# Patient Record
Sex: Female | Born: 1935 | Race: White | Hispanic: No | Marital: Single | Smoking: Never smoker
Health system: Southern US, Community
[De-identification: ages and names within clinical notes are randomized; demographics above are authoritative.]

## PROBLEM LIST (undated history)

## (undated) ENCOUNTER — Emergency Department (HOSPITAL_COMMUNITY): Payer: Medicare Other | Source: Home / Self Care

## (undated) DIAGNOSIS — S8263XA Displaced fracture of lateral malleolus of unspecified fibula, initial encounter for closed fracture: Secondary | ICD-10-CM

## (undated) DIAGNOSIS — N390 Urinary tract infection, site not specified: Secondary | ICD-10-CM

## (undated) DIAGNOSIS — D62 Acute posthemorrhagic anemia: Secondary | ICD-10-CM

## (undated) DIAGNOSIS — F329 Major depressive disorder, single episode, unspecified: Secondary | ICD-10-CM

## (undated) DIAGNOSIS — I739 Peripheral vascular disease, unspecified: Secondary | ICD-10-CM

## (undated) DIAGNOSIS — R5382 Chronic fatigue, unspecified: Secondary | ICD-10-CM

## (undated) DIAGNOSIS — R1314 Dysphagia, pharyngoesophageal phase: Secondary | ICD-10-CM

## (undated) DIAGNOSIS — M545 Low back pain: Secondary | ICD-10-CM

## (undated) DIAGNOSIS — S32009A Unspecified fracture of unspecified lumbar vertebra, initial encounter for closed fracture: Secondary | ICD-10-CM

## (undated) DIAGNOSIS — M810 Age-related osteoporosis without current pathological fracture: Secondary | ICD-10-CM

## (undated) DIAGNOSIS — D485 Neoplasm of uncertain behavior of skin: Secondary | ICD-10-CM

## (undated) DIAGNOSIS — G309 Alzheimer's disease, unspecified: Secondary | ICD-10-CM

## (undated) DIAGNOSIS — F070 Personality change due to known physiological condition: Secondary | ICD-10-CM

## (undated) DIAGNOSIS — G43909 Migraine, unspecified, not intractable, without status migrainosus: Secondary | ICD-10-CM

## (undated) DIAGNOSIS — R269 Unspecified abnormalities of gait and mobility: Secondary | ICD-10-CM

## (undated) DIAGNOSIS — T7840XA Allergy, unspecified, initial encounter: Secondary | ICD-10-CM

## (undated) DIAGNOSIS — E871 Hypo-osmolality and hyponatremia: Secondary | ICD-10-CM

## (undated) DIAGNOSIS — F0280 Dementia in other diseases classified elsewhere without behavioral disturbance: Secondary | ICD-10-CM

## (undated) DIAGNOSIS — B3749 Other urogenital candidiasis: Secondary | ICD-10-CM

## (undated) DIAGNOSIS — F32A Depression, unspecified: Secondary | ICD-10-CM

## (undated) DIAGNOSIS — I509 Heart failure, unspecified: Secondary | ICD-10-CM

## (undated) DIAGNOSIS — H9209 Otalgia, unspecified ear: Secondary | ICD-10-CM

## (undated) DIAGNOSIS — S52599A Other fractures of lower end of unspecified radius, initial encounter for closed fracture: Secondary | ICD-10-CM

## (undated) DIAGNOSIS — K219 Gastro-esophageal reflux disease without esophagitis: Secondary | ICD-10-CM

## (undated) DIAGNOSIS — R350 Frequency of micturition: Secondary | ICD-10-CM

## (undated) DIAGNOSIS — R070 Pain in throat: Secondary | ICD-10-CM

## (undated) DIAGNOSIS — S52609A Unspecified fracture of lower end of unspecified ulna, initial encounter for closed fracture: Secondary | ICD-10-CM

## (undated) DIAGNOSIS — K0381 Cracked tooth: Secondary | ICD-10-CM

## (undated) DIAGNOSIS — N318 Other neuromuscular dysfunction of bladder: Secondary | ICD-10-CM

## (undated) DIAGNOSIS — J209 Acute bronchitis, unspecified: Secondary | ICD-10-CM

## (undated) DIAGNOSIS — L89101 Pressure ulcer of unspecified part of back, stage 1: Secondary | ICD-10-CM

## (undated) DIAGNOSIS — F419 Anxiety disorder, unspecified: Secondary | ICD-10-CM

## (undated) DIAGNOSIS — W19XXXA Unspecified fall, initial encounter: Secondary | ICD-10-CM

## (undated) DIAGNOSIS — E039 Hypothyroidism, unspecified: Secondary | ICD-10-CM

## (undated) DIAGNOSIS — Z4789 Encounter for other orthopedic aftercare: Secondary | ICD-10-CM

## (undated) DIAGNOSIS — K5900 Constipation, unspecified: Secondary | ICD-10-CM

## (undated) DIAGNOSIS — B370 Candidal stomatitis: Secondary | ICD-10-CM

## (undated) DIAGNOSIS — D649 Anemia, unspecified: Secondary | ICD-10-CM

## (undated) DIAGNOSIS — I2510 Atherosclerotic heart disease of native coronary artery without angina pectoris: Secondary | ICD-10-CM

## (undated) HISTORY — DX: Hypo-osmolality and hyponatremia: E87.1

## (undated) HISTORY — DX: Atherosclerotic heart disease of native coronary artery without angina pectoris: I25.10

## (undated) HISTORY — DX: Anemia, unspecified: D64.9

## (undated) HISTORY — DX: Other fractures of lower end of unspecified radius, initial encounter for closed fracture: S52.599A

## (undated) HISTORY — DX: Personality change due to known physiological condition: F07.0

## (undated) HISTORY — DX: Alzheimer's disease, unspecified: G30.9

## (undated) HISTORY — DX: Unspecified fall, initial encounter: W19.XXXA

## (undated) HISTORY — DX: Peripheral vascular disease, unspecified: I73.9

## (undated) HISTORY — DX: Pain in throat: R07.0

## (undated) HISTORY — DX: Dementia in other diseases classified elsewhere without behavioral disturbance: F02.80

## (undated) HISTORY — DX: Major depressive disorder, single episode, unspecified: F32.9

## (undated) HISTORY — DX: Chronic fatigue, unspecified: R53.82

## (undated) HISTORY — DX: Candidal stomatitis: B37.0

## (undated) HISTORY — DX: Acute posthemorrhagic anemia: D62

## (undated) HISTORY — PX: TONSILLECTOMY: SUR1361

## (undated) HISTORY — DX: Other urogenital candidiasis: B37.49

## (undated) HISTORY — DX: Pressure ulcer of unspecified part of back, stage 1: L89.101

## (undated) HISTORY — DX: Gastro-esophageal reflux disease without esophagitis: K21.9

## (undated) HISTORY — DX: Cracked tooth: K03.81

## (undated) HISTORY — DX: Hypothyroidism, unspecified: E03.9

## (undated) HISTORY — DX: Acute bronchitis, unspecified: J20.9

## (undated) HISTORY — DX: Neoplasm of uncertain behavior of skin: D48.5

## (undated) HISTORY — DX: Displaced fracture of lateral malleolus of unspecified fibula, initial encounter for closed fracture: S82.63XA

## (undated) HISTORY — DX: Heart failure, unspecified: I50.9

## (undated) HISTORY — DX: Unspecified fracture of lower end of unspecified ulna, initial encounter for closed fracture: S52.609A

## (undated) HISTORY — DX: Encounter for other orthopedic aftercare: Z47.89

## (undated) HISTORY — DX: Urinary tract infection, site not specified: N39.0

## (undated) HISTORY — DX: Dysphagia, pharyngoesophageal phase: R13.14

## (undated) HISTORY — DX: Age-related osteoporosis without current pathological fracture: M81.0

## (undated) HISTORY — DX: Low back pain: M54.5

## (undated) HISTORY — DX: Other neuromuscular dysfunction of bladder: N31.8

## (undated) HISTORY — DX: Otalgia, unspecified ear: H92.09

## (undated) HISTORY — DX: Unspecified abnormalities of gait and mobility: R26.9

## (undated) HISTORY — DX: Migraine, unspecified, not intractable, without status migrainosus: G43.909

## (undated) HISTORY — DX: Frequency of micturition: R35.0

## (undated) HISTORY — PX: TOTAL ABDOMINAL HYSTERECTOMY: SHX209

## (undated) HISTORY — DX: Constipation, unspecified: K59.00

---

## 1998-08-28 ENCOUNTER — Emergency Department (HOSPITAL_COMMUNITY): Admission: EM | Admit: 1998-08-28 | Discharge: 1998-08-28 | Payer: Self-pay | Admitting: Emergency Medicine

## 2002-09-29 ENCOUNTER — Encounter: Payer: Self-pay | Admitting: Emergency Medicine

## 2002-09-29 ENCOUNTER — Emergency Department (HOSPITAL_COMMUNITY): Admission: EM | Admit: 2002-09-29 | Discharge: 2002-09-29 | Payer: Self-pay | Admitting: Emergency Medicine

## 2005-01-17 ENCOUNTER — Ambulatory Visit: Payer: Self-pay | Admitting: *Deleted

## 2005-01-20 ENCOUNTER — Ambulatory Visit: Payer: Self-pay | Admitting: *Deleted

## 2005-01-27 ENCOUNTER — Ambulatory Visit: Payer: Self-pay | Admitting: *Deleted

## 2005-02-03 ENCOUNTER — Ambulatory Visit: Payer: Self-pay | Admitting: *Deleted

## 2011-07-09 ENCOUNTER — Emergency Department (HOSPITAL_COMMUNITY): Payer: Medicare Other

## 2011-07-09 ENCOUNTER — Encounter: Payer: Self-pay | Admitting: Emergency Medicine

## 2011-07-09 ENCOUNTER — Inpatient Hospital Stay (HOSPITAL_COMMUNITY)
Admission: EM | Admit: 2011-07-09 | Discharge: 2011-07-15 | DRG: 469 | Disposition: A | Discharge status: Skilled Nursing Facility | Payer: Medicare Other | Attending: Orthopedic Surgery | Admitting: Orthopedic Surgery

## 2011-07-09 DIAGNOSIS — Y92009 Unspecified place in unspecified non-institutional (private) residence as the place of occurrence of the external cause: Secondary | ICD-10-CM

## 2011-07-09 DIAGNOSIS — S72009A Fracture of unspecified part of neck of unspecified femur, initial encounter for closed fracture: Secondary | ICD-10-CM | POA: Diagnosis present

## 2011-07-09 DIAGNOSIS — R296 Repeated falls: Secondary | ICD-10-CM | POA: Diagnosis present

## 2011-07-09 DIAGNOSIS — F32A Depression, unspecified: Secondary | ICD-10-CM | POA: Diagnosis present

## 2011-07-09 DIAGNOSIS — F3289 Other specified depressive episodes: Secondary | ICD-10-CM | POA: Diagnosis present

## 2011-07-09 DIAGNOSIS — E860 Dehydration: Secondary | ICD-10-CM

## 2011-07-09 DIAGNOSIS — S52609A Unspecified fracture of lower end of unspecified ulna, initial encounter for closed fracture: Secondary | ICD-10-CM

## 2011-07-09 DIAGNOSIS — F411 Generalized anxiety disorder: Secondary | ICD-10-CM | POA: Diagnosis present

## 2011-07-09 DIAGNOSIS — S52509A Unspecified fracture of the lower end of unspecified radius, initial encounter for closed fracture: Principal | ICD-10-CM

## 2011-07-09 DIAGNOSIS — E871 Hypo-osmolality and hyponatremia: Secondary | ICD-10-CM | POA: Diagnosis present

## 2011-07-09 DIAGNOSIS — F419 Anxiety disorder, unspecified: Secondary | ICD-10-CM | POA: Diagnosis present

## 2011-07-09 DIAGNOSIS — D62 Acute posthemorrhagic anemia: Secondary | ICD-10-CM

## 2011-07-09 DIAGNOSIS — F329 Major depressive disorder, single episode, unspecified: Secondary | ICD-10-CM

## 2011-07-09 HISTORY — DX: Unspecified fracture of unspecified lumbar vertebra, initial encounter for closed fracture: S32.009A

## 2011-07-09 HISTORY — DX: Major depressive disorder, single episode, unspecified: F32.9

## 2011-07-09 HISTORY — DX: Allergy, unspecified, initial encounter: T78.40XA

## 2011-07-09 HISTORY — DX: Anxiety disorder, unspecified: F41.9

## 2011-07-09 HISTORY — DX: Depression, unspecified: F32.A

## 2011-07-09 MED ORDER — SODIUM CHLORIDE 0.9 % IV SOLN
20.0000 mL | INTRAVENOUS | Status: DC
  Administered 2011-07-10: 20 mL via INTRAVENOUS

## 2011-07-09 NOTE — ED Notes (Signed)
UJW:[id number]<YN> Expected date:07/09/11<BR> Expected time: 9:48 PM<BR> Means of arrival:Ambulance<BR> Comments:<BR> EMS 241 GC fall

## 2011-07-10 ENCOUNTER — Encounter (HOSPITAL_COMMUNITY): Payer: Self-pay | Admitting: Orthopedic Surgery

## 2011-07-10 ENCOUNTER — Emergency Department (HOSPITAL_COMMUNITY): Payer: Medicare Other | Admitting: Registered Nurse

## 2011-07-10 ENCOUNTER — Encounter (HOSPITAL_COMMUNITY): Payer: Self-pay | Admitting: Registered Nurse

## 2011-07-10 ENCOUNTER — Emergency Department (HOSPITAL_COMMUNITY): Payer: Medicare Other

## 2011-07-10 ENCOUNTER — Encounter (HOSPITAL_COMMUNITY)
Admission: EM | Disposition: A | Discharge status: Skilled Nursing Facility | Payer: Self-pay | Source: Home / Self Care | Attending: Orthopedic Surgery

## 2011-07-10 ENCOUNTER — Other Ambulatory Visit: Payer: Self-pay

## 2011-07-10 DIAGNOSIS — F329 Major depressive disorder, single episode, unspecified: Secondary | ICD-10-CM | POA: Diagnosis present

## 2011-07-10 DIAGNOSIS — E871 Hypo-osmolality and hyponatremia: Secondary | ICD-10-CM | POA: Diagnosis present

## 2011-07-10 DIAGNOSIS — S72009A Fracture of unspecified part of neck of unspecified femur, initial encounter for closed fracture: Secondary | ICD-10-CM | POA: Diagnosis present

## 2011-07-10 DIAGNOSIS — F419 Anxiety disorder, unspecified: Secondary | ICD-10-CM | POA: Diagnosis present

## 2011-07-10 DIAGNOSIS — S52509A Unspecified fracture of the lower end of unspecified radius, initial encounter for closed fracture: Secondary | ICD-10-CM

## 2011-07-10 DIAGNOSIS — F32A Depression, unspecified: Secondary | ICD-10-CM | POA: Diagnosis present

## 2011-07-10 HISTORY — PX: HIP ARTHROPLASTY: SHX981

## 2011-07-10 HISTORY — PX: ORIF WRIST FRACTURE: SHX2133

## 2011-07-10 LAB — DIFFERENTIAL
Basophils Absolute: 0 10*3/uL (ref 0.0–0.1)
Eosinophils Relative: 0 % (ref 0–5)
Lymphocytes Relative: 7 % — ABNORMAL LOW (ref 12–46)
Monocytes Relative: 4 % (ref 3–12)
Neutrophils Relative %: 89 % — ABNORMAL HIGH (ref 43–77)
WBC Morphology: INCREASED

## 2011-07-10 LAB — CBC
Platelets: 144 10*3/uL — ABNORMAL LOW (ref 150–400)
RBC: 3.97 MIL/uL (ref 3.87–5.11)
RDW: 12.8 % (ref 11.5–15.5)
WBC: 7.4 10*3/uL (ref 4.0–10.5)

## 2011-07-10 LAB — URINALYSIS, ROUTINE W REFLEX MICROSCOPIC
Bilirubin Urine: NEGATIVE
Glucose, UA: NEGATIVE mg/dL
Leukocytes, UA: NEGATIVE
Protein, ur: NEGATIVE mg/dL
pH: 7 (ref 5.0–8.0)

## 2011-07-10 LAB — COMPREHENSIVE METABOLIC PANEL
ALT: 26 U/L (ref 0–35)
AST: 35 U/L (ref 0–37)
Albumin: 3.8 g/dL (ref 3.5–5.2)
CO2: 28 mEq/L (ref 19–32)
Calcium: 9.2 mg/dL (ref 8.4–10.5)
Chloride: 91 mEq/L — ABNORMAL LOW (ref 96–112)
GFR calc non Af Amer: 86 mL/min — ABNORMAL LOW (ref >90)
Sodium: 128 mEq/L — ABNORMAL LOW (ref 135–145)
Total Bilirubin: 0.6 mg/dL (ref 0.3–1.2)

## 2011-07-10 SURGERY — HEMIARTHROPLASTY, HIP, DIRECT ANTERIOR APPROACH, FOR FRACTURE
Anesthesia: General | Site: Wrist | Laterality: Right | Wound class: Clean

## 2011-07-10 MED ORDER — NEOSTIGMINE METHYLSULFATE 1 MG/ML IJ SOLN
INTRAMUSCULAR | Status: DC | PRN
  Administered 2011-07-10: 3 mg via INTRAVENOUS

## 2011-07-10 MED ORDER — MORPHINE SULFATE 2 MG/ML IJ SOLN: 0.5000 mg | INTRAMUSCULAR | Status: DC | PRN

## 2011-07-10 MED ORDER — MORPHINE SULFATE 2 MG/ML IJ SOLN
1.0000 mg | INTRAMUSCULAR | Status: DC | PRN
  Administered 2011-07-10: 1 mg via INTRAVENOUS
  Filled 2011-07-10: qty 1

## 2011-07-10 MED ORDER — ONDANSETRON HCL 4 MG/2ML IJ SOLN
INTRAMUSCULAR | Status: DC | PRN
  Administered 2011-07-10: 4 mg via INTRAVENOUS

## 2011-07-10 MED ORDER — MORPHINE SULFATE 2 MG/ML IJ SOLN
2.0000 mg | Freq: Once | INTRAMUSCULAR | Status: AC
  Administered 2011-07-10: 2 mg via INTRAVENOUS
  Filled 2011-07-10: qty 1

## 2011-07-10 MED ORDER — FENTANYL CITRATE 0.05 MG/ML IJ SOLN
25.0000 ug | INTRAMUSCULAR | Status: DC | PRN
  Administered 2011-07-10 (×2): 50 ug via INTRAVENOUS

## 2011-07-10 MED ORDER — ONDANSETRON HCL 4 MG PO TABS: 4.0000 mg | ORAL_TABLET | Freq: Four times a day (QID) | ORAL | Status: DC | PRN

## 2011-07-10 MED ORDER — GLYCOPYRROLATE 0.2 MG/ML IJ SOLN
INTRAMUSCULAR | Status: DC | PRN
  Administered 2011-07-10: .5 mg via INTRAVENOUS

## 2011-07-10 MED ORDER — ESMOLOL HCL 10 MG/ML IV SOLN
INTRAVENOUS | Status: DC | PRN
  Administered 2011-07-10: 20 mg via INTRAVENOUS

## 2011-07-10 MED ORDER — POLYETHYLENE GLYCOL 3350 17 G PO PACK
17.0000 g | PACK | Freq: Every day | ORAL | Status: DC | PRN
  Filled 2011-07-10: qty 1

## 2011-07-10 MED ORDER — EPHEDRINE SULFATE 50 MG/ML IJ SOLN
INTRAMUSCULAR | Status: DC | PRN
  Administered 2011-07-10 (×6): 5 mg via INTRAVENOUS

## 2011-07-10 MED ORDER — PHENOL 1.4 % MT LIQD
1.0000 | OROMUCOSAL | Status: DC | PRN
  Filled 2011-07-10: qty 177

## 2011-07-10 MED ORDER — METOCLOPRAMIDE HCL 5 MG/ML IJ SOLN: 5.0000 mg | Freq: Three times a day (TID) | INTRAMUSCULAR | Status: DC | PRN

## 2011-07-10 MED ORDER — ROCURONIUM BROMIDE 100 MG/10ML IV SOLN
INTRAVENOUS | Status: DC | PRN
  Administered 2011-07-10: 30 mg via INTRAVENOUS
  Administered 2011-07-10: 10 mg via INTRAVENOUS

## 2011-07-10 MED ORDER — CEFAZOLIN SODIUM 1-5 GM-% IV SOLN
INTRAVENOUS | Status: AC
  Administered 2011-07-10: 1000 mg
  Filled 2011-07-10: qty 50

## 2011-07-10 MED ORDER — HYDROCODONE-ACETAMINOPHEN 5-325 MG PO TABS
1.0000 | ORAL_TABLET | ORAL | Status: DC | PRN
  Administered 2011-07-10 – 2011-07-11 (×2): 2 via ORAL
  Administered 2011-07-11 – 2011-07-13 (×3): 1 via ORAL
  Filled 2011-07-10 (×2): qty 1
  Filled 2011-07-10 (×2): qty 2
  Filled 2011-07-10 (×2): qty 1

## 2011-07-10 MED ORDER — FLEET ENEMA 7-19 GM/118ML RE ENEM: 1.0000 | ENEMA | Freq: Every day | RECTAL | Status: DC | PRN

## 2011-07-10 MED ORDER — ACETAMINOPHEN 325 MG PO TABS
650.0000 mg | ORAL_TABLET | Freq: Four times a day (QID) | ORAL | Status: DC | PRN
  Administered 2011-07-14: 650 mg via ORAL
  Filled 2011-07-10 (×2): qty 1
  Filled 2011-07-10: qty 2

## 2011-07-10 MED ORDER — PROPOFOL 10 MG/ML IV EMUL
INTRAVENOUS | Status: DC | PRN
  Administered 2011-07-10: 100 mg via INTRAVENOUS

## 2011-07-10 MED ORDER — ACETAMINOPHEN 650 MG RE SUPP: 650.0000 mg | Freq: Four times a day (QID) | RECTAL | Status: DC | PRN

## 2011-07-10 MED ORDER — ALUM & MAG HYDROXIDE-SIMETH 200-200-20 MG/5ML PO SUSP: 30.0000 mL | ORAL | Status: DC | PRN

## 2011-07-10 MED ORDER — DEXAMETHASONE SODIUM PHOSPHATE 4 MG/ML IJ SOLN
8.0000 mg | Freq: Once | INTRAMUSCULAR | Status: AC | PRN
  Filled 2011-07-10: qty 2

## 2011-07-10 MED ORDER — CEFAZOLIN SODIUM 1-5 GM-% IV SOLN
1.0000 g | Freq: Four times a day (QID) | INTRAVENOUS | Status: AC
  Administered 2011-07-10 – 2011-07-11 (×3): 1 g via INTRAVENOUS
  Filled 2011-07-10 (×3): qty 50

## 2011-07-10 MED ORDER — LIDOCAINE HCL (CARDIAC) 20 MG/ML IV SOLN
INTRAVENOUS | Status: DC | PRN
  Administered 2011-07-10: 60 mg via INTRAVENOUS

## 2011-07-10 MED ORDER — LACTATED RINGERS IV SOLN
INTRAVENOUS | Status: DC | PRN
  Administered 2011-07-10: 11:00:00 via INTRAVENOUS

## 2011-07-10 MED ORDER — CEFAZOLIN SODIUM 1-5 GM-% IV SOLN: 1.0000 g | INTRAVENOUS | Status: DC

## 2011-07-10 MED ORDER — MENTHOL 3 MG MT LOZG
1.0000 | LOZENGE | OROMUCOSAL | Status: DC | PRN
  Filled 2011-07-10: qty 9

## 2011-07-10 MED ORDER — ENOXAPARIN SODIUM 40 MG/0.4ML ~~LOC~~ SOLN
40.0000 mg | SUBCUTANEOUS | Status: DC
  Administered 2011-07-11 – 2011-07-13 (×3): 40 mg via SUBCUTANEOUS
  Filled 2011-07-10 (×3): qty 0.4

## 2011-07-10 MED ORDER — ENOXAPARIN SODIUM 40 MG/0.4ML ~~LOC~~ SOLN
40.0000 mg | SUBCUTANEOUS | Status: DC
  Filled 2011-07-10 (×2): qty 0.4

## 2011-07-10 MED ORDER — BISACODYL 10 MG RE SUPP
10.0000 mg | Freq: Every day | RECTAL | Status: DC | PRN
  Filled 2011-07-10: qty 1

## 2011-07-10 MED ORDER — POTASSIUM CHLORIDE IN NACL 20-0.9 MEQ/L-% IV SOLN
INTRAVENOUS | Status: DC
  Administered 2011-07-11: 03:00:00 via INTRAVENOUS
  Filled 2011-07-10 (×3): qty 1000

## 2011-07-10 MED ORDER — ONDANSETRON HCL 4 MG/2ML IJ SOLN: 4.0000 mg | Freq: Four times a day (QID) | INTRAMUSCULAR | Status: DC | PRN

## 2011-07-10 MED ORDER — DOCUSATE SODIUM 100 MG PO CAPS
100.0000 mg | ORAL_CAPSULE | Freq: Two times a day (BID) | ORAL | Status: DC
  Administered 2011-07-10 – 2011-07-13 (×4): 100 mg via ORAL
  Filled 2011-07-10 (×9): qty 1

## 2011-07-10 MED ORDER — FENTANYL CITRATE 0.05 MG/ML IJ SOLN
INTRAMUSCULAR | Status: AC
  Filled 2011-07-10: qty 2

## 2011-07-10 MED ORDER — SODIUM CHLORIDE 0.9 % IV SOLN
INTRAVENOUS | Status: DC
  Administered 2011-07-10: 04:00:00 via INTRAVENOUS

## 2011-07-10 MED ORDER — MAGNESIUM HYDROXIDE 400 MG/5ML PO SUSP: 30.0000 mL | Freq: Two times a day (BID) | ORAL | Status: DC | PRN

## 2011-07-10 MED ORDER — SODIUM CHLORIDE 0.9 % IV BOLUS (SEPSIS)
500.0000 mL | Freq: Once | INTRAVENOUS | Status: AC
  Administered 2011-07-10: 500 mL via INTRAVENOUS

## 2011-07-10 MED ORDER — FENTANYL CITRATE 0.05 MG/ML IJ SOLN
INTRAMUSCULAR | Status: DC | PRN
  Administered 2011-07-10 (×5): 50 ug via INTRAVENOUS

## 2011-07-10 MED ORDER — LACTATED RINGERS IV SOLN: INTRAVENOUS | Status: DC

## 2011-07-10 MED ORDER — BISACODYL 5 MG PO TBEC: 10.0000 mg | DELAYED_RELEASE_TABLET | Freq: Every day | ORAL | Status: DC | PRN

## 2011-07-10 MED ORDER — METOCLOPRAMIDE HCL 10 MG PO TABS: 5.0000 mg | ORAL_TABLET | Freq: Three times a day (TID) | ORAL | Status: DC | PRN

## 2011-07-10 MED ORDER — MEPERIDINE HCL 25 MG/ML IJ SOLN: 6.2500 mg | INTRAMUSCULAR | Status: DC | PRN

## 2011-07-10 SURGICAL SUPPLY — 105 items
BAG ZIPLOCK 12X15 (MISCELLANEOUS) ×3 IMPLANT
BANDAGE GAUZE ELAST BULKY 4 IN (GAUZE/BANDAGES/DRESSINGS) ×3 IMPLANT
BIT DRILL 2 FAST STEP (BIT) ×3 IMPLANT
BIT DRILL 2.5X4 QC (BIT) ×3 IMPLANT
BLADE EXTENDED COATED 6.5IN (ELECTRODE) ×3 IMPLANT
BLADE HEX COATED 2.75 (ELECTRODE) ×3 IMPLANT
BLADE SAW SAG 73X25 THK (BLADE) ×1
BLADE SAW SGTL 13.0X1.19X90.0M (BLADE) ×3 IMPLANT
BLADE SAW SGTL 73X25 THK (BLADE) ×2 IMPLANT
BRUSH FEMORAL CANAL (MISCELLANEOUS) ×3 IMPLANT
CANISTER SUCTION 2500CC (MISCELLANEOUS) ×3 IMPLANT
CEMENT BONE DEPUY (Cement) ×6 IMPLANT
CEMENT RESTRICTOR DEPUY SZ 4 (Cement) ×3 IMPLANT
CLOTH BEACON ORANGE TIMEOUT ST (SAFETY) ×3 IMPLANT
CUFF TOURN SGL QUICK 18 (TOURNIQUET CUFF) ×3 IMPLANT
DECANTER SPIKE VIAL GLASS SM (MISCELLANEOUS) ×3 IMPLANT
DRAPE INCISE IOBAN 66X45 STRL (DRAPES) ×3 IMPLANT
DRAPE INCISE IOBAN 85X60 (DRAPES) ×3 IMPLANT
DRAPE LG THREE QUARTER DISP (DRAPES) ×3 IMPLANT
DRAPE OEC MINIVIEW 54X84 (DRAPES) ×3 IMPLANT
DRAPE ORTHO SPLIT 77X108 STRL (DRAPES) ×2
DRAPE POUCH INSTRU U-SHP 10X18 (DRAPES) ×3 IMPLANT
DRAPE SURG ORHT 6 SPLT 77X108 (DRAPES) ×4 IMPLANT
DRAPE U-SHAPE 47X51 STRL (DRAPES) ×3 IMPLANT
DRAPE WARM FLUID 44X44 (DRAPE) ×3 IMPLANT
DRSG EMULSION OIL 3X3 NADH (GAUZE/BANDAGES/DRESSINGS) ×3 IMPLANT
DRSG MEPILEX BORDER 4X12 (GAUZE/BANDAGES/DRESSINGS) ×3 IMPLANT
DRSG PAD ABDOMINAL 8X10 ST (GAUZE/BANDAGES/DRESSINGS) IMPLANT
DURAPREP 26ML APPLICATOR (WOUND CARE) ×3 IMPLANT
ELECT REM PT RETURN 9FT ADLT (ELECTROSURGICAL) ×3
ELECTRODE REM PT RTRN 9FT ADLT (ELECTROSURGICAL) ×2 IMPLANT
EVACUATOR 1/8 PVC DRAIN (DRAIN) IMPLANT
EVACUATOR SILICONE 100CC (DRAIN) IMPLANT
FACESHIELD LNG OPTICON STERILE (SAFETY) IMPLANT
GAUZE KERLIX 2  STERILE LF (GAUZE/BANDAGES/DRESSINGS) ×3 IMPLANT
GAUZE SPONGE 4X4 16PLY XRAY LF (GAUZE/BANDAGES/DRESSINGS) ×9 IMPLANT
GAUZE XEROFORM 1X8 LF (GAUZE/BANDAGES/DRESSINGS) ×3 IMPLANT
GAUZE XEROFORM 5X9 LF (GAUZE/BANDAGES/DRESSINGS) IMPLANT
GLOVE BIO SURGEON STRL SZ8 (GLOVE) ×6 IMPLANT
GLOVE BIOGEL PI IND STRL 8 (GLOVE) ×6 IMPLANT
GLOVE BIOGEL PI INDICATOR 8 (GLOVE) ×3
GLOVE ECLIPSE 8.5 STRL (GLOVE) ×3 IMPLANT
HANDPIECE INTERPULSE COAX TIP (DISPOSABLE) ×1
IMMOBILIZER KNEE 20 (SOFTGOODS) ×3
IMMOBILIZER KNEE 20 THIGH 36 (SOFTGOODS) ×2 IMPLANT
K-WIRE 1.6 (WIRE) ×2
K-WIRE FX5X1.6XNS BN SS (WIRE) ×4
KIT BASIN OR (CUSTOM PROCEDURE TRAY) ×3 IMPLANT
KWIRE 4.0 X .045IN (WIRE) ×6 IMPLANT
KWIRE 4.0 X .062IN (WIRE) ×9 IMPLANT
KWIRE FX5X1.6XNS BN SS (WIRE) ×4 IMPLANT
MANIFOLD NEPTUNE II (INSTRUMENTS) ×6 IMPLANT
NEEDLE HYPO 22GX1.5 SAFETY (NEEDLE) IMPLANT
NS IRRIG 1000ML POUR BTL (IV SOLUTION) ×6 IMPLANT
PACK LOWER EXTREMITY WL (CUSTOM PROCEDURE TRAY) ×3 IMPLANT
PACK TOTAL JOINT (CUSTOM PROCEDURE TRAY) ×3 IMPLANT
PAD CAST 3X4 CTTN HI CHSV (CAST SUPPLIES) ×2 IMPLANT
PAD CAST 4YDX4 CTTN HI CHSV (CAST SUPPLIES) ×2 IMPLANT
PADDING CAST COTTON 3X4 STRL (CAST SUPPLIES) ×1
PADDING CAST COTTON 4X4 STRL (CAST SUPPLIES) ×1
PADDING WEBRIL 3 STERILE (GAUZE/BANDAGES/DRESSINGS) ×3 IMPLANT
PASSER SUT SWANSON 36MM LOOP (INSTRUMENTS) ×3 IMPLANT
PEG SUBCHONDRAL SMOOTH 2.0X18 (Peg) ×3 IMPLANT
PEG SUBCHONDRAL SMOOTH 2.0X20 (Peg) ×3 IMPLANT
PEG SUBCHONDRAL SMOOTH 2.0X22 (Peg) ×3 IMPLANT
PEG THREADED 2.5MMX18MM LONG (Peg) ×3 IMPLANT
PEG THREADED 2.5MMX20MM LONG (Peg) ×3 IMPLANT
PEG THREADED 2.5MMX22MM LONG (Peg) ×3 IMPLANT
PILLOW ABDUCTION HIP (SOFTGOODS) ×3 IMPLANT
PLATE STAN 24.4X59.5 RT (Plate) ×3 IMPLANT
POSITIONER SURGICAL ARM (MISCELLANEOUS) ×6 IMPLANT
SCREW BN 12X3.5XNS CORT TI (Screw) ×4 IMPLANT
SCREW CORT 3.5X10 LNG (Screw) ×3 IMPLANT
SCREW CORT 3.5X12 (Screw) ×2 IMPLANT
SCREW CORT 3.5X14 LNG (Screw) ×3 IMPLANT
SET HNDPC FAN SPRY TIP SCT (DISPOSABLE) ×2 IMPLANT
SPLINT PLASTER 3X15 (CAST SUPPLIES) ×3 IMPLANT
SPONGE GAUZE 4X4 12PLY (GAUZE/BANDAGES/DRESSINGS) ×6 IMPLANT
SPONGE LAP 18X18 X RAY DECT (DISPOSABLE) ×6 IMPLANT
STAPLER VISISTAT 35W (STAPLE) ×3 IMPLANT
SUCTION FRAZIER 12FR DISP (SUCTIONS) ×3 IMPLANT
SUT BONE WAX W31G (SUTURE) IMPLANT
SUT ETHIBOND NAB CT1 #1 30IN (SUTURE) IMPLANT
SUT ETHILON 6 0 PS 3 18 (SUTURE) IMPLANT
SUT FIBERWIRE #2 38 T-5 BLUE (SUTURE) ×6
SUT MERSILENE 4 0 P 3 (SUTURE) IMPLANT
SUT MNCRL AB 4-0 PS2 18 (SUTURE) IMPLANT
SUT PROLENE 3 0 PS 2 (SUTURE) IMPLANT
SUT PROLENE 4 0 P 3 18 (SUTURE) IMPLANT
SUT PROLENE 4 0 RB 1 (SUTURE)
SUT PROLENE 4-0 RB1 .5 CRCL 36 (SUTURE) IMPLANT
SUT VIC AB 0 CT1 27 (SUTURE) ×1
SUT VIC AB 0 CT1 27XBRD ANTBC (SUTURE) ×2 IMPLANT
SUT VIC AB 1 CT1 27 (SUTURE) ×2
SUT VIC AB 1 CT1 27XBRD ANTBC (SUTURE) ×4 IMPLANT
SUT VIC AB 2-0 CT1 27 (SUTURE) ×1
SUT VIC AB 2-0 CT1 TAPERPNT 27 (SUTURE) ×2 IMPLANT
SUT VIC AB 2-0 CT2 27 (SUTURE) ×3 IMPLANT
SUTURE FIBERWR #2 38 T-5 BLUE (SUTURE) ×4 IMPLANT
SYR 30ML LL (SYRINGE) IMPLANT
TOWER CARTRIDGE SMART MIX (DISPOSABLE) ×3 IMPLANT
TRAY FOLEY CATH 14FRSI W/METER (CATHETERS) ×3 IMPLANT
TRAY PREP A LATEX SAFE STRL (SET/KITS/TRAYS/PACK) ×6 IMPLANT
UNDERPAD 30X30 INCONTINENT (UNDERPADS AND DIAPERS) ×3 IMPLANT
WATER STERILE IRR 1500ML POUR (IV SOLUTION) ×3 IMPLANT

## 2011-07-10 NOTE — Anesthesia Postprocedure Evaluation (Signed)
  Anesthesia Post-op Note  Patient: Laura Benitez  Procedure(s) Performed:  ARTHROPLASTY BIPOLAR HIP; OPEN REDUCTION INTERNAL FIXATION (ORIF) WRIST FRACTURE  Patient Location: PACU  Anesthesia Type: General  Level of Consciousness: awake and alert   Airway and Oxygen Therapy: Patient Spontanous Breathing  Post-op Pain: mild  Post-op Assessment: Post-op Vital signs reviewed, Patient's Cardiovascular Status Stable, Respiratory Function Stable, Patent Airway and No signs of Nausea or vomiting  Post-op Vital Signs: stable  Complications: No apparent anesthesia complications

## 2011-07-10 NOTE — Preoperative (Signed)
Beta Blockers   Reason not to administer Beta Blockers:Not Applicable 

## 2011-07-10 NOTE — ED Notes (Signed)
2 Belongings bag placed under nurses desk on blue side. Pt transported to OR.

## 2011-07-10 NOTE — Transfer of Care (Signed)
Immediate Anesthesia Transfer of Care Note  Patient: Laura Benitez  Procedure(s) Performed:  ARTHROPLASTY BIPOLAR HIP; OPEN REDUCTION INTERNAL FIXATION (ORIF) WRIST FRACTURE  Patient Location: PACU  Anesthesia Type: General  Level of Consciousness: awake, alert , oriented and patient cooperative  Airway & Oxygen Therapy: Patient Spontanous Breathing and Patient connected to face mask oxygen  Post-op Assessment: Report given to PACU RN and Post -op Vital signs reviewed and stable  Post vital signs: Reviewed and stable  Complications: No apparent anesthesia complications

## 2011-07-10 NOTE — Op Note (Signed)
NAMEDAGMAR, ADCOX                 ACCOUNT NO.:  [id number]  MEDICAL RECORD NO.:  [id number]  LOCATION:  WOTF                         FACILITY:  [facility]  PHYSICIAN:  Doralee Albino. Carola Frost, M.D. DATE OF BIRTH:  [birth year]  DATE OF PROCEDURE:  07/10/2011 DATE OF DISCHARGE:                              OPERATIVE REPORT   PREOPERATIVE DIAGNOSES: 1. Displaced right femoral neck fracture. 2. Displaced right distal radius fracture.  POSTOPERATIVE DIAGNOSES: 1. Displaced right femoral neck fracture. 2. Displaced right distal radius fracture.  PROCEDURES: 1. Right hip hemiarthroplasty using a DePuy cemented #2 femoral stem,     standard neck, 46 mm head. 2. Open reduction internal fixation of right distal radius with distal     volar radius plate.  SURGEON:  Doralee Albino. Carola Frost, M.D.  ASSISTANT:  Mearl Latin, [place]  ANESTHESIA:  General.  COMPLICATIONS:  None.  ESTIMATED BLOOD LOSS:  Combined, about 200 cc.  URINARY OUTPUT:  950 cc.  DISPOSITION:  To PACU.  CONDITION:  Stable.  BRIEF SUMMARY AND INDICATION FOR PROCEDURE:  Laura Benitez is a very pleasant 77 year old female who sustained a fall at her home resulting in a displaced fracture of her right hip and right distal radius.  I discussed with her preoperatively the risks and benefits of surgical repair including a possibility of failure to unite, need for further surgery, arthritis, DVT, PE, heart attack, stroke, need for conversion to total arthroplasty, possibility of dislocation or instability, leg length inequality, and multiple others.  The patient understood these risks and voiced her consent to proceed.  BRIEF SUMMARY OF PROCEDURE:  The patient was administered 1 g of Ancef preoperatively, taken to the room where general anesthesia was induced. She was carefully positioned right side up.  Right lower extremity was prepped and draped in the usual sterile fashion.  The Foley, which had been placed in the emergency department  was brought approximately to allow for inspection by the anesthesia team over the course of the case. After a standard prep and drape of the right hip, incision was made, dissection carried down to the tensor, which was then split in line with the skin.  The hip was rotated to expose the medius, which was retracted,  piriformis with a joint tendon with the short rotators, this was divided and tagged at its insertion.  This exposed the capsule underneath.  The capsule was T'd along the neck and back to the acetabulum.  The femoral neck cut was then refined using a saw blade and a cutting guide.  The femoral head was then withdrawn and sized to a 46. Broken neck fragments were debrided.  The trial femoral heads were then placed in the acetabulum again finding best fit with the 46.  The proximal femur was controlled by my assistant Montez Morita who was critical necessary for the case.  He exposed the proximal femur during the use of the box cutter, canal finder, lateralizer.  Sequential reaming up to 3, sequential broaching up to 2 where we failed to achieve good proximal rotational control despite what felt like some resistance. Consequently, decision was made to convert to a cemented stem, which would provide for  maximal stability particularly in rotation.  Standard cementing technique was used with lavage, irrigation, packing the canal followed by the placement of the stem, carefully controlling rotation throughout and then the standard neck and head were applied, the hip relocated and just as the trial components had been stable, so the actual components were stable in full flexion, adduction, internal rotation as well as extension and external rotation.  The capsule was then repaired back with figure-of-eight #1 Vicryl, a #2 FiberWire directly backto bone for the tendon and then the tensor with #1 figure- of-eight Vicryl, 0 Vicryl for the deep, subcu 2-0 Vicryl, and staples for the skin.   Sterile gently compressive dressing was applied.  Again, Montez Morita assisted me throughout the procedure and was necessary for its safe and effective completion.  Of note, Anesthesia discerned that there was not any flow from the Foley catheter, but was unable to adjust the catheter during the procedure because of increased risk of disruption of the sterile field.  Immediately following the procedure, a Foley catheter was placed and eventually drained out 900 cc over the ensuing procedure.  She was positioned supine, on her back, translated such that the wrists were laid on the radiolucent board.  Dissection was then carried down to the radius and then to the  FCR tendon sheath, releasing the pronator along its radial edge, identifying the fracture site, mobilizing with the [place] within the fracture site with distraction and ulnar translation followed by placement of a K-wire for revision fixation and then the narrow standard length DVR plate.  This was stabilized originally with K-wires followed by standard screws and terminally threaded screws in the articular subchondral portion as well as pegs. Final images showed restoration of what appeared to be anatomic length, inclination and tilt.  The wound was irrigated thoroughly and closed in two layered fashion using a 2-0 Vicryl for the pronator repair, 3-0 Vicryl for the subcu and a 4-0 nylon for the skin.  Then a sterile gently compressive dressing was applied followed by a volar wrist splint.  Montez Morita, again helped me throughout and was necessary for the case as he provided for exposure, protection of the radial artery, manual reduction, placement of both provisional and definitive fixation. I did exchange 1 screw that was the initial workhorse screw for maximal apposition of the plate to the bone.  This did eventually strip and did leave [place] with 6 out cortices in the metastasis.  PROGNOSIS:  The patient will be weightbearing as  tolerated on lower extremities and weightbearing as tolerated to the right elbow with a platform walker.  We anticipate she will need some assistance and because she has been living alone independently this would likely be rehab or a short-term nursing facility depending upon her rehabilitation course.  Recovery of mobility may be complicated by her profound kyphosis and scoliosis.  Her age certainly increases her risk of general complications in the perioperative period as well.    Doralee Albino. Carola Frost, M.D.    MHH/MEDQ  D:  07/10/2011  T:  07/10/2011  Job:  [id number]

## 2011-07-10 NOTE — H&P (Signed)
I have seen and examined the patient. I agree with the findings above. PMH also notable for significant thoracic scoliosis.  Laura Benitez H 07/10/2011 10:04 AM    I discussed with the patient the risks and benefits of surgery, including the possibility of infection, nerve injury, vessel injury, wound breakdown, arthritis, symptomatic hardware, DVT/ PE, loss of motion, and need for further surgery among others.  We also specifically discussed limb length inequality, dislocation/ instability, possible conversion to total hip arthroplasty.  She understands these risks and wishes to proceed with partial hip replacement on the right and right distal radius repair.

## 2011-07-10 NOTE — ED Notes (Signed)
Corwin Levins [phone]; [phone], this is pts helper.

## 2011-07-10 NOTE — ED Notes (Signed)
ZOX:[id number]<AV> Expected date:07/10/11<BR> Expected time: 2:48 PM<BR> Means of arrival:Ambulance<BR> Comments:<BR> GC M41. 78 YO F. N/V X 2 DAYS. STABLE. ETA 10 MIN

## 2011-07-10 NOTE — OR Nursing (Signed)
Foley catheter removed that was placed in emergency room placement wrong site abdomen distended..foley reinserted by d. Rj Pedrosa, rn  At [facility]

## 2011-07-10 NOTE — ED Provider Notes (Signed)
History     CSN: [id number] Arrival date & time: 07/09/2011  9:57 PM   First MD Initiated Contact with Patient 07/09/11 2330      Chief Complaint  Patient presents with  . Fall    Pt to ED with s/p fall off step. Pt did not pass out. She lost her balabce  landing on her right hip and wrist    Patient is a 77 y.o. female presenting with fall.  Fall The accident occurred 3 to 5 hours ago. The fall occurred while walking. The point of impact was the right wrist and right hip. The pain is moderate. Pertinent negatives include no visual change, no numbness, no abdominal pain, no vomiting, no headaches and no loss of consciousness. The symptoms are aggravated by activity and use of the injured limb.  Pt presents s/p fall She was walking up to step, lost balance and fell onto right side No head injury No LOC No headache Reports pain in right wrist/hip only No cp/sob    Past Medical History  Diagnosis Date  . No pertinent past medical history     No past surgical history on file.  No family history on file.  History  Substance Use Topics  . Smoking status: Not on file  . Smokeless tobacco: Not on file  . Alcohol Use: Not on file    OB History    Grav Para Term Preterm Abortions TAB SAB Ect Mult Living                  Review of Systems  Gastrointestinal: Negative for vomiting and abdominal pain.  Neurological: Negative for loss of consciousness, numbness and headaches.  All other systems reviewed and are negative.    Allergies  Review of patient's allergies indicates no known allergies.  Home Medications  No current outpatient prescriptions on file.  BP 121/70  Pulse 87  Temp(Src) 97.4 F (36.3 C) (Oral)  Resp 16  SpO2 96%  Physical Exam  CONSTITUTIONAL: Well developed/well nourished HEAD AND FACE: Normocephalic/atraumatic EYES: EOMI/PERRL ENMT: Mucous membranes moist, no evidence of facial trauma NECK: supple no meningeal signs SPINE:entire spine  nontender CV:  no murmurs/rubs/gallops noted LUNGS: Lungs are clear to auscultation bilaterally, no apparent distress ABDOMEN: soft, nontender, no rebound or guarding NEURO: Pt is awake/alert, moves all extremitiesx4 EXTREMITIES: pulses normal Deformity/tenderness to right wrist, no laceration/bleeding noted.  Distally, motor/sensory intact on right hand Tenderness with ROM of right hip All other extremities/joints palpated/ranged and nontender SKIN: warm, color normal PSYCH: no abnormalities of mood noted   ED Course  Procedures    Labs Reviewed  CBC  DIFFERENTIAL  COMPREHENSIVE METABOLIC PANEL  TYPE AND SCREEN  URINALYSIS, ROUTINE W REFLEX MICROSCOPIC   Dg Chest 1 View  07/10/2011  *RADIOLOGY REPORT*  Clinical Data: Chest pain status post fall  CHEST - 1 VIEW  Comparison: None.  Findings: Tortuous thoracic aorta.  Cardiomegaly.  Hyperinflation/ emphysematous change.  Linear lung base opacities may reflect scarring.  Heart size enlarged.  Severe osteopenia and multilevel compression fractures, incompletely characterized.  Leftward curvature of the thoracic spine.  IMPRESSION: Hyperinflation and bibasilar scarring.  Cardiomegaly and tortuous thoracic aorta.  Diffuse osteopenia and multilevel thoracic compression fractures are age indeterminate.  Original Report Authenticated By: Waneta Martins, M.D.   Dg Wrist Complete Right  07/10/2011  *RADIOLOGY REPORT*  Clinical Data: Right wrist pain status post fall.  RIGHT WRIST - COMPLETE 3+ VIEW  Comparison: None.  Findings: There is  a mildly comminuted/predominately transverse fracture through the distal radius with dorsal displacement of the distal segment. Mild dorsal angulation. No intra-articular extension identified.  Ulnar styloid fracture.  Diffuse osteopenia. Soft tissue swelling.  Due to positional limitations, suboptimal carpal bone evaluation.  IMPRESSION: Distal radius fracture with dorsal displacement and angulation.  Ulnar  styloid fracture.  Original Report Authenticated By: Waneta Martins, M.D.   Dg Hip Complete Right  07/10/2011  *RADIOLOGY REPORT*  Clinical Data:  Right hip pain status post fall.  RIGHT HIP - COMPLETE 2+ VIEW  Comparison: None  Findings: There is a displaced fracture of the right femoral neck, with superior positioning of the distal component.  The femoral head is rotated however remains seated within the acetabulum. There is diffuse osteopenia.  The sacrum is obscured by stool containing colon.  Otherwise, no additional fracture identified.  IMPRESSION: Displaced right femoral neck fracture.  Original Report Authenticated By: Waneta Martins, M.D.       MDM  Nursing notes reviewed and considered in documentation All labs/vitals reviewed and considered xrays reviewed and considered   12:50 AM D/w dr Izora Ribas, agree with sugartong and will see in hospital  1:41 AM D/w dr Priscille Kluver, will admit Pt stable in ED   Date: 07/10/2011  Rate: 83  Rhythm: normal sinus rhythm  QRS Axis: normal  Intervals: normal  ST/T Wave abnormalities: nonspecific ST changes  Conduction Disutrbances:none  Narrative Interpretation:   Old EKG Reviewed: unchanged    Joya Gaskins, MD 07/10/11 [phone]

## 2011-07-10 NOTE — H&P (Signed)
Laura Benitez is an 77 y.o. female.   Chief Complaint:   Right Wrist and Hip pain after a fall HPI:   This 77 yo white female presents with c/o right wrist and hip pain after a fall this pm.  Reports she was coming back from the store and missed a step on her front walk and may have tripped and fell.  C/o immed hip and wrist pain and was unable to get up.  She called for help for about 20 min before her neighbor found her and she was brought to the ED  Past Medical History  Diagnosis Date  . No pertinent past medical history   . Depression   . Anxiety   . Fracture of lumbar spine     History in 2000  . Allergy     Past Surgical History  Procedure Date  . Tonsillectomy     @age  4  . Total abdominal hysterectomy     History reviewed. No pertinent family history. Social History:  reports that she has never smoked. She does not have any smokeless tobacco history on file. She reports that she does not drink alcohol or use illicit drugs.  Allergies: No Known Allergies  Medications Prior to Admission  Medication Dose Route Frequency Provider Last Rate Last Dose  . 0.9 %  sodium chloride infusion  20 mL Intravenous Continuous Joya Gaskins, MD 20 mL/hr at 07/10/11 0043 20 mL at 07/10/11 0043  . morphine 2 MG/ML injection 2 mg  2 mg Intravenous Once Joya Gaskins, MD   2 mg at 07/10/11 0044  . morphine 2 MG/ML injection 2 mg  2 mg Intravenous Once Joya Gaskins, MD      . sodium chloride 0.9 % bolus 500 mL  500 mL Intravenous Once Joya Gaskins, MD   500 mL at 07/10/11 0148   Medications Prior to Admission  Medication Sig Dispense Refill  . LORazepam (ATIVAN) 1 MG tablet Take 2 mg by mouth 2 (two) times daily. May take 1-2 tablets by mouth at bedtime         Results for orders placed during the hospital encounter of 07/09/11 (from the past 48 hour(s))  CBC     Status: Abnormal   Collection Time   07/10/11 12:25 AM      Component Value Range Comment   WBC 7.4  4.0 -  10.5 (K/uL)    RBC 3.97  3.87 - 5.11 (MIL/uL)    Hemoglobin 13.0  12.0 - 15.0 (g/dL)    HCT 13.2  44.0 - 10.2 (%)    MCV 95.2  78.0 - 100.0 (fL)    MCH 32.7  26.0 - 34.0 (pg)    MCHC 34.4  30.0 - 36.0 (g/dL)    RDW 72.5  36.6 - 44.0 (%)    Platelets 144 (*) 150 - 400 (K/uL)   DIFFERENTIAL     Status: Abnormal   Collection Time   07/10/11 12:25 AM      Component Value Range Comment   Neutrophils Relative 89 (*) 43 - 77 (%)    Lymphocytes Relative 7 (*) 12 - 46 (%)    Monocytes Relative 4  3 - 12 (%)    Eosinophils Relative 0  0 - 5 (%)    Basophils Relative 0  0 - 1 (%)    Neutro Abs 6.6  1.7 - 7.7 (K/uL)    Lymphs Abs 0.5 (*) 0.7 - 4.0 (K/uL)  Monocytes Absolute 0.3  0.1 - 1.0 (K/uL)    Eosinophils Absolute 0.0  0.0 - 0.7 (K/uL)    Basophils Absolute 0.0  0.0 - 0.1 (K/uL)    WBC Morphology INCREASED BANDS (>20% BANDS)   TOXIC GRANULATION  COMPREHENSIVE METABOLIC PANEL     Status: Abnormal   Collection Time   07/10/11 12:25 AM      Component Value Range Comment   Sodium 128 (*) 135 - 145 (mEq/L)    Potassium 3.5  3.5 - 5.1 (mEq/L)    Chloride 91 (*) 96 - 112 (mEq/L)    CO2 28  19 - 32 (mEq/L)    Glucose, Bld 131 (*) 70 - 99 (mg/dL)    BUN 14  6 - 23 (mg/dL)    Creatinine, Ser 1.61  0.50 - 1.10 (mg/dL)    Calcium 9.2  8.4 - 10.5 (mg/dL)    Total Protein 6.7  6.0 - 8.3 (g/dL)    Albumin 3.8  3.5 - 5.2 (g/dL)    AST 35  0 - 37 (U/L)    ALT 26  0 - 35 (U/L)    Alkaline Phosphatase 99  39 - 117 (U/L)    Total Bilirubin 0.6  0.3 - 1.2 (mg/dL)    GFR calc non Af Amer 86 (*) >90 (mL/min)    GFR calc Af Amer >90  >90 (mL/min)   TYPE AND SCREEN     Status: Normal   Collection Time   07/10/11 12:25 AM      Component Value Range Comment   ABO/RH(D) O POS      Antibody Screen NEG      Sample Expiration 07/13/2011     ABO/RH     Status: Normal   Collection Time   07/10/11 12:25 AM      Component Value Range Comment   ABO/RH(D) O POS     URINALYSIS, ROUTINE W REFLEX  MICROSCOPIC     Status: Abnormal   Collection Time   07/10/11 12:46 AM      Component Value Range Comment   Color, Urine YELLOW  YELLOW     Appearance CLEAR  CLEAR     Specific Gravity, Urine 1.012  1.005 - 1.030     pH 7.0  5.0 - 8.0     Glucose, UA NEGATIVE  NEGATIVE (mg/dL)    Hgb urine dipstick NEGATIVE  NEGATIVE     Bilirubin Urine NEGATIVE  NEGATIVE     Ketones, ur TRACE (*) NEGATIVE (mg/dL)    Protein, ur NEGATIVE  NEGATIVE (mg/dL)    Urobilinogen, UA 0.2  0.0 - 1.0 (mg/dL)    Nitrite NEGATIVE  NEGATIVE     Leukocytes, UA NEGATIVE  NEGATIVE  MICROSCOPIC NOT DONE ON URINES WITH NEGATIVE PROTEIN, BLOOD, LEUKOCYTES, NITRITE, OR GLUCOSE <1000 mg/dL.   Dg Chest 1 View  07/10/2011  *RADIOLOGY REPORT*  Clinical Data: Chest pain status post fall  CHEST - 1 VIEW  Comparison: None.  Findings: Tortuous thoracic aorta.  Cardiomegaly.  Hyperinflation/ emphysematous change.  Linear lung base opacities may reflect scarring.  Heart size enlarged.  Severe osteopenia and multilevel compression fractures, incompletely characterized.  Leftward curvature of the thoracic spine.  IMPRESSION: Hyperinflation and bibasilar scarring.  Cardiomegaly and tortuous thoracic aorta.  Diffuse osteopenia and multilevel thoracic compression fractures are age indeterminate.  Original Report Authenticated By: Waneta Martins, M.D.   Dg Wrist Complete Right  07/10/2011  *RADIOLOGY REPORT*  Clinical Data: Right wrist pain status post  fall.  RIGHT WRIST - COMPLETE 3+ VIEW  Comparison: None.  Findings: There is a mildly comminuted/predominately transverse fracture through the distal radius with dorsal displacement of the distal segment. Mild dorsal angulation. No intra-articular extension identified.  Ulnar styloid fracture.  Diffuse osteopenia. Soft tissue swelling.  Due to positional limitations, suboptimal carpal bone evaluation.  IMPRESSION: Distal radius fracture with dorsal displacement and angulation.  Ulnar styloid  fracture.  Original Report Authenticated By: Waneta Martins, M.D.   Dg Hip Complete Right  07/10/2011  *RADIOLOGY REPORT*  Clinical Data:  Right hip pain status post fall.  RIGHT HIP - COMPLETE 2+ VIEW  Comparison: None  Findings: There is a displaced fracture of the right femoral neck, with superior positioning of the distal component.  The femoral head is rotated however remains seated within the acetabulum. There is diffuse osteopenia.  The sacrum is obscured by stool containing colon.  Otherwise, no additional fracture identified.  IMPRESSION: Displaced right femoral neck fracture.  Original Report Authenticated By: Waneta Martins, M.D.    Review of Systems  Constitutional: Negative.   HENT: Positive for hearing loss.   Eyes: Negative.   Respiratory: Negative.   Cardiovascular: Negative.   Gastrointestinal: Negative.   Genitourinary: Negative.   Musculoskeletal: Positive for myalgias and back pain.  Skin: Negative.   Neurological: Positive for tingling and headaches.  Endo/Heme/Allergies: Positive for environmental allergies.  Psychiatric/Behavioral: Positive for depression. The patient is nervous/anxious.     Blood pressure 122/70, pulse 84, temperature 97.8 F (36.6 C), temperature source Oral, resp. rate 22, SpO2 98.00%. Physical Exam  Constitutional: She is oriented to person, place, and time. She appears well-developed.  HENT:  Head: Normocephalic and atraumatic.  Eyes: Conjunctivae and EOM are normal. Pupils are equal, round, and reactive to light.  Neck: Neck supple.  Cardiovascular: Normal rate, regular rhythm and normal heart sounds.   Respiratory: Effort normal and breath sounds normal.  GI: Soft. Bowel sounds are normal.  Musculoskeletal:       Right wrist: She exhibits decreased range of motion, bony tenderness, swelling and deformity.       Right hip: She exhibits decreased range of motion, bony tenderness and deformity.  Neurological: She is alert and  oriented to person, place, and time.  Skin: Skin is warm and dry.  Psychiatric: She has a normal mood and affect. Her behavior is normal. Judgment and thought content normal.     Assessment/Plan:   Fractured Right Distal Radius and Ulna Fractured Right Femoral Neck - Displaced Anxiety Depression Hyponatremia Hx lumbar comp fractures Seasonal Allergies Hx of Migraines  Plan:  Hand surgery has been consulted for the wrist fracture.  She will be admitted to the Ortho floor for probable surgery by Dr Carola Frost in the am.  NPO for OR .     Darly Fails 07/10/2011, 2:48 AM

## 2011-07-10 NOTE — ED Notes (Signed)
Patient not in room.will obtain labs upon patient return 

## 2011-07-10 NOTE — Brief Op Note (Signed)
07/09/2011 - 07/10/2011  3:10 PM  PATIENT:  Laura Benitez  77 y.o. female  PRE-OPERATIVE DIAGNOSIS:  Fx right hip, Fx right wrist  POST-OPERATIVE DIAGNOSIS:  fractured right hip,and right wrist  PROCEDURE:  Procedure(s): ARTHROPLASTY BIPOLAR HIP with DePuy [place] press fit cemented #2 femur, standard neck, 46mm unipolar head OPEN REDUCTION INTERNAL FIXATION (ORIF) WRIST FRACTURE with DVR  SURGEON:  Surgeon(s): [employer]  PHYSICIAN ASSISTANT: Montez Morita, [facility]   ANESTHESIA:   general  EBL:  Total I/O In: 2005 [I.V.:2005] Out: 1325 [Urine:1025; Blood:300]  BLOOD ADMINISTERED:none  DRAINS: none   LOCAL MEDICATIONS USED:  NONE  SPECIMEN:  No Specimen  DISPOSITION OF SPECIMEN:  N/A  COUNTS:  YES  TOURNIQUET:  * Missing tourniquet times found for documented tourniquets in log:  [id number] *  DICTATION: .Other Dictation: Dictation Number [id number]  PLAN OF CARE: inpatient  PATIENT DISPOSITION:  PACU - hemodynamically stable.   Delay start of Pharmacological VTE agent (>24hrs) due to surgical blood loss or risk of bleeding:  {YES/NO/NOT APPLICABLE:20182

## 2011-07-10 NOTE — Anesthesia Preprocedure Evaluation (Addendum)
Anesthesia Evaluation  Patient identified by MRN, date of birth, ID band Patient awake    Reviewed: Allergy & Precautions, H&P , NPO status , Patient's Chart, lab work & pertinent test results  Airway Mallampati: II TM Distance: >3 FB Neck ROM: Full    Dental No notable dental hx. (+) Teeth Intact   Pulmonary neg pulmonary ROS,  clear to auscultation  Pulmonary exam normal       Cardiovascular neg cardio ROS Regular Normal    Neuro/Psych Negative Neurological ROS  Negative Psych ROS   GI/Hepatic negative GI ROS, Neg liver ROS,   Endo/Other  Negative Endocrine ROS  Renal/GU negative Renal ROS  Genitourinary negative   Musculoskeletal negative musculoskeletal ROS (+)   Abdominal   Peds negative pediatric ROS (+)  Hematology negative hematology ROS (+)   Anesthesia Other Findings   Reproductive/Obstetrics negative OB ROS                          Anesthesia Physical Anesthesia Plan  ASA: II  Anesthesia Plan: General   Post-op Pain Management:    Induction: Intravenous  Airway Management Planned: Oral ETT  Additional Equipment:   Intra-op Plan:   Post-operative Plan: Extubation in OR  Informed Consent: I have reviewed the patients History and Physical, chart, labs and discussed the procedure including the risks, benefits and alternatives for the proposed anesthesia with the patient or authorized representative who has indicated his/her understanding and acceptance.   Dental advisory given  Plan Discussed with: CRNA  Anesthesia Plan Comments: (Npo yes )       Anesthesia Quick Evaluation

## 2011-07-11 LAB — COMPREHENSIVE METABOLIC PANEL
ALT: 20 U/L (ref 0–35)
AST: 42 U/L — ABNORMAL HIGH (ref 0–37)
Alkaline Phosphatase: 77 U/L (ref 39–117)
CO2: 23 mEq/L (ref 19–32)
Calcium: 8.2 mg/dL — ABNORMAL LOW (ref 8.4–10.5)
GFR calc non Af Amer: >90 mL/min (ref >90)
Potassium: 4.3 mEq/L (ref 3.5–5.1)
Sodium: 127 mEq/L — ABNORMAL LOW (ref 135–145)
Total Protein: 4.9 g/dL — ABNORMAL LOW (ref 6.0–8.3)

## 2011-07-11 LAB — BASIC METABOLIC PANEL
CO2: 26 mEq/L (ref 19–32)
Chloride: 95 mEq/L — ABNORMAL LOW (ref 96–112)
Creatinine, Ser: 0.43 mg/dL — ABNORMAL LOW (ref 0.50–1.10)
GFR calc Af Amer: >90 mL/min (ref >90)
Potassium: 4.2 mEq/L (ref 3.5–5.1)
Sodium: 128 mEq/L — ABNORMAL LOW (ref 135–145)

## 2011-07-11 LAB — OSMOLALITY, URINE: Osmolality, Ur: 671 mOsm/kg (ref 390–1090)

## 2011-07-11 LAB — CBC
MCV: 94.3 fL (ref 78.0–100.0)
Platelets: 100 10*3/uL — ABNORMAL LOW (ref 150–400)
RBC: 2.98 MIL/uL — ABNORMAL LOW (ref 3.87–5.11)
RDW: 12.8 % (ref 11.5–15.5)
WBC: 7.2 10*3/uL (ref 4.0–10.5)

## 2011-07-11 MED ORDER — POTASSIUM CHLORIDE IN NACL 20-0.9 MEQ/L-% IV SOLN
INTRAVENOUS | Status: DC
  Filled 2011-07-11 (×8): qty 1000

## 2011-07-11 MED ORDER — LORAZEPAM 1 MG PO TABS
2.0000 mg | ORAL_TABLET | Freq: Two times a day (BID) | ORAL | Status: DC
  Administered 2011-07-11 – 2011-07-13 (×3): 2 mg via ORAL
  Administered 2011-07-13: 1 mg via ORAL
  Filled 2011-07-11 (×4): qty 2

## 2011-07-11 MED ORDER — ACETAMINOPHEN 10 MG/ML IV SOLN: 1000.0000 mg | Freq: Once | INTRAVENOUS | Status: AC

## 2011-07-11 MED ORDER — LACTATED RINGERS IV SOLN: INTRAVENOUS | Status: DC

## 2011-07-11 NOTE — Progress Notes (Signed)
CSW aware and will work with patient and medical team in order to identify potential placement for Mon or Tuesday.  Beverly Sessions MSW, LCSW [phone]

## 2011-07-11 NOTE — Progress Notes (Signed)
Subjective: 1 Day Post-Op Procedure(s) (LRB): ARTHROPLASTY BIPOLAR HIP (Right) OPEN REDUCTION INTERNAL FIXATION (ORIF) WRIST FRACTURE (Right)    Patient is doing well. No significant complaints today. She did work with physical therapy this morning and did walk a little bit. Pain is fairly well-controlled. Patient is sitting up in chair eating lunch. Patient denies any chest pain, shortness of breath, nausea, vomiting, diarrhea. Patient denies any numbness or tingling in her right upper and right lower extremities. No additional issues are noted.  Objective: Current Vitals Blood pressure 101/44, pulse 84, temperature 98.5 F (36.9 C), temperature source Oral, resp. rate 14, height 5' (1.524 m), weight 43.092 kg (95 lb), SpO2 96.00%. Vital signs in last 24 hours: Temp:  [96.8 F (36 C)-98.5 F (36.9 C)] 98.5 F (36.9 C) (11/23 1117) Pulse Rate:  [78-99] 84  (11/23 1117) Resp:  [9-22] 14  (11/23 1117) BP: (90-133)/(44-81) 101/44 mmHg (11/23 1117) SpO2:  [92 %-100 %] 96 % (11/23 1117) Weight:  [43.092 kg (95 lb)] 95 lb (43.092 kg) (11/22 1900)  Intake/Output from previous day: 11/22 0701 - 11/23 0700 In: 3380 [P.O.:240; I.V.:3090; IV Piggyback:50] Out: 1925 [Urine:1625; Blood:300]  LABS  Basename 07/11/11 0410 07/10/11 0025  HGB 9.9* 13.0    Basename 07/11/11 0410 07/10/11 0025  WBC 7.2 7.4  RBC 2.98* 3.97  HCT 28.1* 37.8  PLT 100* 144*    Basename 07/11/11 0410 07/10/11 0025  NA 161*096* 128*  K 4.24.3 3.5  CL 95*95* 91*  CO2 2623 28  BUN 1111 14  CREATININE 0.43*0.44* 0.56  GLUCOSE 124*122* 131*  CALCIUM 8.2*8.2* 9.2   No results found for this basename: LABPT:2,INR:2 in the last 72 hours  Serum osmolality: 270 mOsm/kg  Physical Exam  Gen: Patient appears well, no acute distress, pleasant, cooperative and answers all questions appropriately Lungs: Clear Cardiac: S1 and S2. No murmur appreciated Abd: + Bowel sounds   Ext: Right upper  extremity  splint fitting well.  Swelling is fairly well-controlled.   active motion of her digits is intact  Extremities warm.  No pain with passive stretch.  Radial, ulnar and median sensory functions are intact.   Radial, ulnar, median motor functions are intact  Anterior interosseous and posterior interosseous nerve motor function are intact as well          Right lower extremity  Dressings are clean dry and intact  Distal motor and sensory functions are intact  Extremity is warm with palpable dorsalis pedis pulse  No deep calf tenderness  No pain with passive stretching  No additional findings are noted.     Imaging Dg Chest 1 View  07/10/2011  *RADIOLOGY REPORT*  Clinical Data: Chest pain status post fall  CHEST - 1 VIEW  Comparison: None.  Findings: Tortuous thoracic aorta.  Cardiomegaly.  Hyperinflation/ emphysematous change.  Linear lung base opacities may reflect scarring.  Heart size enlarged.  Severe osteopenia and multilevel compression fractures, incompletely characterized.  Leftward curvature of the thoracic spine.  IMPRESSION: Hyperinflation and bibasilar scarring.  Cardiomegaly and tortuous thoracic aorta.  Diffuse osteopenia and multilevel thoracic compression fractures are age indeterminate.  Original Report Authenticated By: Waneta Martins, M.D.   Ap Pelvis Hip  07/10/2011  *RADIOLOGY REPORT*  Clinical Data: Status post hip replacement.  PELVIS - 1-2 VIEW  Comparison: Plain films 07/10/2011 at 12:12 a.m.  Findings: The patient has a new unipolar hemiarthroplasty on the right.  Gas in the soft tissues and surgical staples noted.  Device is located.  No acute fracture.  IMPRESSION: Right hip replacement without evidence of complication.  Original Report Authenticated By: Bernadene Bell. Maricela Curet, M.D.   Dg Wrist 2 Views Right  07/10/2011  *RADIOLOGY REPORT*  Clinical Data: This post fracture fixation.  RIGHT WRIST - 2 VIEW  Comparison: Plain films 07/10/2011 at 12:15  a.m.  Findings: There is a new volar plate and screws for fixation of a distal radius fracture.  Position and alignment markedly improved. Ulnar styloid fracture is again noted.  No new abnormality. Plaster splint noted.  IMPRESSION: Status post ORIF distal radius fracture without evidence of complication.  Ulnar styloid fracture again noted.  Original Report Authenticated By: Bernadene Bell. D'ALESSIO, M.D.   Dg Wrist Complete Right  07/10/2011  *RADIOLOGY REPORT*  Clinical Data: Right wrist pain status post fall.  RIGHT WRIST - COMPLETE 3+ VIEW  Comparison: None.  Findings: There is a mildly comminuted/predominately transverse fracture through the distal radius with dorsal displacement of the distal segment. Mild dorsal angulation. No intra-articular extension identified.  Ulnar styloid fracture.  Diffuse osteopenia. Soft tissue swelling.  Due to positional limitations, suboptimal carpal bone evaluation.  IMPRESSION: Distal radius fracture with dorsal displacement and angulation.  Ulnar styloid fracture.  Original Report Authenticated By: Waneta Martins, M.D.   Lateral Right Hip   07/10/2011  *RADIOLOGY REPORT*  Clinical Data: Hip fracture.  Status post hip replacement.  RIGHT HIP - 1 VIEW  Comparison: Plain films 07/10/2011 12:12 a.m.  Findings: New unipolar hemiarthroplasty on the right is identified. Device appears located.  No acute fracture.  Gas in the soft tissues and surgical staples noted.  IMPRESSION: Right hip replacement without evidence of complication.  Original Report Authenticated By: Bernadene Bell. D'ALESSIO, M.D.   Dg Hip Complete Right  07/10/2011  *RADIOLOGY REPORT*  Clinical Data:  Right hip pain status post fall.  RIGHT HIP - COMPLETE 2+ VIEW  Comparison: None  Findings: There is a displaced fracture of the right femoral neck, with superior positioning of the distal component.  The femoral head is rotated however remains seated within the acetabulum. There is diffuse osteopenia.  The  sacrum is obscured by stool containing colon.  Otherwise, no additional fracture identified.  IMPRESSION: Displaced right femoral neck fracture.  Original Report Authenticated By: Waneta Martins, M.D.   Dg Hand 2 View Right  07/10/2011  *RADIOLOGY REPORT*  Clinical Data: Status post fracture fixation.  RIGHT HAND - 2 VIEW  Comparison: Plain films 07/10/2011 at 12:15 a.m.  Findings: There is partial visualization of volar plate and screws for fixation of a distal radius fracture.  Ulnar styloid fracture is noted.  No new fracture is identified.  Bones appear osteopenic. Volar splint is identified.  IMPRESSION: Status post fixation of a distal radius fracture.  Ulnar styloid fracture noted.  No acute finding.  Original Report Authenticated By: Bernadene Bell. Maricela Curet, M.D.    Assessment/Plan: 1 Day Post-Op Procedure(s) (LRB): ARTHROPLASTY BIPOLAR HIP (Right) OPEN REDUCTION INTERNAL FIXATION (ORIF) WRIST FRACTURE (Right)  77 year old female status post fall with right distal radius and right femoral neck fractures postop day 1   1. Right distal radius fracture status post ORIF  Nonweightbearing right wrist  Hand and elbow motion as tolerated  Ice and elevate as needed for pain and swelling control  Continue with OT  Sling as needed for comfort  Ambulation with platform walker, okay to weight-bear through elbow 2. Right femoral neck fracture status post right hip hemiarthroplasty  Weight-bear as tolerated right lower  extremity with the use of a platform walker  Posterior hip precautions  Dressing change tomorrow or Sunday, if necessary  Ice as needed  Continue with therapy 3. Hypotonic hypovolemic hyponatremia  Patient was hyponatremic on admission and continues to be hyponatremic postsurgery. Based on her vitals as well as laboratory parameters it does appear that she is hypovolemic given her low blood pressure readings, and heart rate in the high 90s.  Laboratory parameters such as serum  osmolality reflect hypotonic state. Given the fact that the patient lives alone and has some impaired mobility it is probable that she is dehydrated to some extent.  We will continue with gentle hydration with normal saline at 100 cc per hour for the next 24 hours and reevaluate her labs in the morning.  I suspect that this is chronic in nature and no increase her serum sodium gradually so as not to potentiate any neurologic disturbances.  Should she continue to trend downward we will obtain a medicine consult. 4. acute blood loss anemia  Continue to monitor  Check CBC in a.m.  Probable that patient will need a transfusion at some point however am hopeful that we can avoid this.  If patient becomes symptomatic or hemoglobin less than 8 or a hematocrit less than 24 we will likely transfuse 5. FEN  Diet as tolerated 6. DVT/PE prophylaxis  Lovenox for 14-21 days 7. Activity  Continue with OT and PT  Weight-bear as tolerated to right elbow and on right lower extremity  Posterior hip precautions right hip  Patient to use platform walker when mobilizing with a platform on the right side 8. Psych   the patient was on  no medications prior to admission,  continue to monitor.   patient has a history of depression and anxiety. 9. disposition   continue with therapies  Monitor her CBC and lytes  Probable SNF on Monday  Mearl Latin, PA-C 07/11/2011, 1:49 PM

## 2011-07-11 NOTE — Progress Notes (Signed)
Physical Therapy Treatment Patient Details Name: ANNALEIGHA WOO MRN: [id number] DOB: 25-Jan-1929 Today's Date: 07/11/2011 1439 - 1518 2TA PT Assessment/Plan  PT - Assessment/Plan PT Plan: Discharge plan remains appropriate PT Frequency: Min 3X/week Follow Up Recommendations: Skilled nursing facility Equipment Recommended: Defer to next venue PT Goals  Acute Rehab PT Goals PT Goal: Supine/Side to Sit - Progress: Not met PT Goal: Sit to Supine/Side - Progress: Not met PT Transfer Goal: Sit to Stand/Stand to Sit - Progress: Not met PT Goal: Ambulate - Progress: Not met  PT Treatment Precautions/Restrictions  Precautions Precautions: Posterior Hip Precaution Booklet Issued:  (Sign hung in room) Restrictions Weight Bearing Restrictions: Yes RUE Weight Bearing:  (NWB R wrist but WBAT on elbow to use PFRW) Mobility (including Balance) Bed Mobility Bed Mobility: Yes Sit to Supine - Right: 1: +2 Total assist Sit to Supine - Right Details (indicate cue type and reason): PT 20% Transfers Sit to Stand: 1: +2 Total assist;With armrests;With upper extremity assist;From chair/3-in-1 Sit to Stand Details (indicate cue type and reason): cues for LE position and use of UEs Stand to Sit: 1: +2 Total assist;To bed;With upper extremity assist Stand to Sit Details: cues for LE position and use of UEs Ambulation/Gait Ambulation/Gait Assistance: 1: +2 Total assist Ambulation/Gait Assistance Details (indicate cue type and reason): cues for posture, sequence, UE WB, position from RW weight shifts, stride length, and saftey awareness Ambulation Distance (Feet): 3 Feet Assistive device: Right platform walker Gait Pattern: Step-to pattern Gait velocity: slow    Exercise    End of Session PT - End of Session Equipment Utilized During Treatment: Gait belt Activity Tolerance: Patient tolerated treatment well Patient left: with call bell in reach;in bed General Behavior During Session: [facility] for tasks  performed Cognition: Impaired (I don't know where I am?  This building?)  Pierce Biagini 07/11/2011, 3:59 PM

## 2011-07-11 NOTE — Progress Notes (Signed)
Physical Therapy Evaluation Patient Details Name: Laura Benitez MRN: [id number] DOB: 1929/01/04 Today's Date: 07/11/2011 1148 - 1237 EVAL, TE Problem List:  Patient Active Problem List  Diagnoses  . Femoral neck fracture  . Radius and ulna distal fracture  . Depression  . Anxiety  . Hyponatremia    Past Medical History:  Past Medical History  Diagnosis Date  . No pertinent past medical history   . Depression   . Anxiety   . Fracture of lumbar spine     History in 2000  . Allergy    Past Surgical History:  Past Surgical History  Procedure Date  . Tonsillectomy     @age  4  . Total abdominal hysterectomy     PT Assessment/Plan/Recommendation   PT Goals     PT Evaluation Precautions/Restrictions    Prior Functioning      Cognition   Sensation/Coordination   Extremity Assessment   Mobility (including Balance)      Exercise    End of Session    Markian Glockner 07/11/2011, 3:52 PM

## 2011-07-12 LAB — CBC
HCT: 23.9 % — ABNORMAL LOW (ref 36.0–46.0)
Hemoglobin: 8.5 g/dL — ABNORMAL LOW (ref 12.0–15.0)
MCV: 94.1 fL (ref 78.0–100.0)
WBC: 6.5 10*3/uL (ref 4.0–10.5)

## 2011-07-12 LAB — BASIC METABOLIC PANEL
BUN: 15 mg/dL (ref 6–23)
Chloride: 95 mEq/L — ABNORMAL LOW (ref 96–112)
Creatinine, Ser: 0.48 mg/dL — ABNORMAL LOW (ref 0.50–1.10)
Glucose, Bld: 94 mg/dL (ref 70–99)
Potassium: 4 mEq/L (ref 3.5–5.1)

## 2011-07-12 MED ORDER — METHOCARBAMOL 100 MG/ML IJ SOLN
500.0000 mg | Freq: Four times a day (QID) | INTRAVENOUS | Status: DC | PRN
  Filled 2011-07-12: qty 5

## 2011-07-12 MED ORDER — METHOCARBAMOL 500 MG PO TABS: 500.0000 mg | ORAL_TABLET | Freq: Four times a day (QID) | ORAL | Status: DC | PRN

## 2011-07-12 NOTE — Progress Notes (Signed)
Physical Therapy Treatment Patient Details Name: [name] MRN: [id number] DOB: [birth year] Today's Date: 07/12/2011 1525 - 1605 GT, TA  PT Assessment/Plan  PT - Assessment/Plan PT Plan: Discharge plan remains appropriate PT Frequency: Min 3X/week Follow Up Recommendations: Skilled nursing facility Equipment Recommended: Defer to next venue PT Goals  Acute Rehab PT Goals PT Goal: Supine/Side to Sit - Progress: Progressing toward goal PT Goal: Sit to Supine/Side - Progress: Progressing toward goal PT Transfer Goal: Sit to Stand/Stand to Sit - Progress: Progressing toward goal PT Goal: Ambulate - Progress: Progressing toward goal  PT Treatment Precautions/Restrictions  Precautions Precautions: Posterior Hip Precaution Booklet Issued:  (Sign hung in room) Required Braces or Orthoses: No Restrictions Weight Bearing Restrictions: Yes RUE Weight Bearing: Weight bear through elbow only Mobility (including Balance) Bed Mobility Bed Mobility: Yes Supine to Sit: 1: +2 Total assist Supine to Sit Details (indicate cue type and reason): pt=30% with HOB up approximately 30 degrees Sit to Supine - Right: 1: +2 Total assist Sit to Supine - Right Details (indicate cue type and reason): PT 20% Transfers Sit to Stand: 3: Mod assist;From elevated surface;With upper extremity assist;From bed Sit to Stand Details (indicate cue type and reason): cues for LE position and use of UEs Stand to Sit: 1: +2 Total assist;To bed;With upper extremity assist Stand to Sit Details: cues for LE position and use of UEs Ambulation/Gait Ambulation/Gait Assistance: 1: +2 Total assist Ambulation/Gait Assistance Details (indicate cue type and reason): cues for sequence, posture, position from RW, stride length, increased WB UEs. Ambulation Distance (Feet): 21 Feet Assistive device: Right platform walker Gait Pattern: Step-to pattern Gait velocity: slow    Exercise    End of Session PT - End of  Session Equipment Utilized During Treatment: Gait belt Activity Tolerance: Patient tolerated treatment well Patient left: in bed;with call bell in reach General Behavior During Session: [facility] for tasks performed Cognition: Impaired, at baseline  Laura Benitez 07/12/2011, 4:14 PM

## 2011-07-12 NOTE — Progress Notes (Signed)
Occupational Therapy Evaluation Patient Details Name: Laura Benitez MRN: [id number] DOB: April 26, 1929 Today's Date: 07/12/2011 12:22-12:59 Co-session with PT Durene Cal)  Problem List:  Patient Active Problem List  Diagnoses  . Femoral neck fracture  . Radius and ulna distal fracture  . Depression  . Anxiety  . Hyponatremia    Past Medical History:  Past Medical History  Diagnosis Date  . No pertinent past medical history   . Depression   . Anxiety   . Fracture of lumbar spine     History in 2000  . Allergy    Past Surgical History:  Past Surgical History  Procedure Date  . Tonsillectomy     @age  4  . Total abdominal hysterectomy     OT Assessment/Plan/Recommendation OT Assessment Clinical Impression Statement: This 77 yo female s/p RTHA and RUE distal ORIF of radius presents to acute OT with the following deficts thus affecting her PLOF at I with BADLs. Will benefit from acute OT with follow up OT at SNF to get to a S-Mod I level. OT Recommendation/Assessment: Patient will need skilled OT in the acute care venue OT Problem List: Decreased strength;Decreased range of motion;Impaired balance (sitting and/or standing);Decreased cognition;Decreased safety awareness;Decreased knowledge of use of DME or AE;Decreased knowledge of precautions;Impaired UE functional use Barriers to Discharge: Decreased caregiver support OT Therapy Diagnosis : Generalized weakness;Cognitive deficits OT Plan OT Frequency: Min 2X/week OT Treatment/Interventions: DME and/or AE instruction;Therapeutic exercise;Patient/family education;Balance training OT Recommendation Follow Up Recommendations: Skilled nursing facility Equipment Recommended: Defer to next venue Individuals Consulted Consulted and Agree with Results and Recommendations: Patient OT Goals Acute Rehab OT Goals OT Goal Formulation: With patient Time For Goal Achievement: 7 days ADL Goals Pt Will Perform Eating: with modified  independence;Supported;with adaptive utensils ADL Goal: Eating - Progress: Progressing toward goals Pt Will Perform Grooming: with set-up;with supervision;Supported;Sitting, edge of bed;Sitting, chair;with adaptive equipment ADL Goal: Grooming - Progress: Progressing toward goals Pt Will Transfer to Toilet: with min assist;Ambulation;3-in-1;Maintaining hip precautions;Maintaining weight bearing status;with cueing (comment type and amount);Other (comment) (min verbal cues) ADL Goal: Toilet Transfer - Progress: Progressing toward goals Pt Will Perform Toileting - Clothing Manipulation: with min assist;Standing ADL Goal: Toileting - Clothing Manipulation - Progress: Progressing toward goals Pt Will Perform Toileting - Hygiene: with min assist;Sitting on 3-in-1 or toilet;Sit to stand from 3-in-1/toilet ADL Goal: Toileting - Hygiene - Progress: Progressing toward goals Additional ADL Goal #1: Pt will be min A to get to EOB with HOB up 30 degrees and min verbal cues to A wtih BADLS and/or prepare for transfers ADL Goal: Additional Goal #1 - Progress: Progressing toward goals Arm Goals Additional Arm Goal #1: Pt will be S with RUE AROM exercises for all joints not inhibited by cast. Arm Goal: Additional Goal #1 - Progress: Progressing toward goals  OT Evaluation Precautions/Restrictions  Precautions Precautions: Posterior Hip Precaution Booklet Issued:  (Sign hung in room) Required Braces or Orthoses: No Restrictions Weight Bearing Restrictions: Yes RUE Weight Bearing: Weight bear through elbow only Prior Functioning Home Living Lives With: Alone Receives Help From: Personal care attendant Type of Home: Independent living facility Additional Comments: Pt D/C'ing to SNF Prior Function Level of Independence: Requires assistive device for independence Vocation: Retired ADL ADL Eating/Feeding: Performed;Set up Eating/Feeding Details (indicate cue type and reason): Due to decreased use of  RUE Where Assessed - Eating/Feeding: Bed level Grooming: Simulated;Minimal assistance Grooming Details (indicate cue type and reason): Due to decreased use of RUE Where Assessed - Grooming: Sitting, bed  Upper Body Bathing: Simulated;Minimal assistance Upper Body Bathing Details (indicate cue type and reason): Due to decreased use of RUE Where Assessed - Upper Body Bathing: Sitting, bed Lower Body Bathing: Simulated;+2 Total assistance Lower Body Bathing Details (indicate cue type and reason): One to stand and one for bathing, pt=10% Where Assessed - Lower Body Bathing: Sit to stand from bed Upper Body Dressing: Simulated;Moderate assistance Upper Body Dressing Details (indicate cue type and reason): Due to decreased use of RUE Where Assessed - Upper Body Dressing: Sitting, bed Lower Body Dressing: Simulated;+2 Total assistance Lower Body Dressing Details (indicate cue type and reason): One to stand and one for bathing, pt=10% Where Assessed - Lower Body Dressing: Sit to stand from bed Toilet Transfer: Simulated;Other (comment);Minimal assistance (Bed to hallway with chair brought to her.) Toilet Transfer Details (indicate cue type and reason): For balance, sequence, and hip precautions Toilet Transfer Method: Ambulating Toileting - Clothing Manipulation: Simulated;Minimal assistance Toileting - Clothing Manipulation Details (indicate cue type and reason): Due to decreased balance and decreased use of RUE Where Assessed - Toileting Clothing Manipulation: Other (comment) (sit to stand bed) Toileting - Hygiene: Simulated;Moderate assistance Toileting - Hygiene Details (indicate cue type and reason): Due to decreased balance and decreased use of RUE and pt right handed Where Assessed - Toileting Hygiene: Other (comment) (sit to stand bed) Tub/Shower Transfer: Not assessed Tub/Shower Transfer Method: Not assessed Equipment Used: Rolling walker Vision/Perception     Cognition Cognition Arousal/Alertness: Awake/alert Overall Cognitive Status: Impaired Attention: Impaired Current Attention Level: Sustained Memory: Decreased recall of precautions Following Commands: Follows one step commands consistently Safety/Judgement: Decreased awareness of safety precautions;Decreased safety judgement for tasks assessed Problem Solving: Requires assistance for problem solving Sensation/Coordination   Extremity Assessment RUE Assessment RUE Assessment: Exceptions to [facility] RUE AROM (degrees) Right Forearm Pronation  0-80-90:  (casting blockin this movement) Right Forearm Supination  0-80-90:  (casting blockin this movement) Right Wrist Extension 0-70:  (casting blockin this movement) Right Wrist Flexion 0-80:  (casting blockin this movement) Right Composite Finger Extension: 75% (edema from surgery and cast) Right Composite Finger Flexion: 50% (edema from surgery and cast) Right Thumb Opposition: Digit 2;Digit 3 RUE Strength RUE Overall Strength Comments: grossly 3/5 LUE Assessment LUE Assessment: Exceptions to [facility] LUE Strength LUE Overall Strength Comments: grossly 3/5 Mobility  Bed Mobility Bed Mobility: Yes Supine to Sit: 1: +2 Total assist Supine to Sit Details (indicate cue type and reason): pt=30% with HOB up approximately 30 degrees Sit to Supine - Right: Not Tested (comment) Transfers Transfers: Yes Sit to Stand: 3: Mod assist;From elevated surface;With upper extremity assist;From bed Stand to Sit: 1: +2 Total assist;With upper extremity assist;To chair/3-in-1 Stand to Sit Details: one to help control descent and cues for sequence and one to help extend RLE, pt=60% Exercises   End of Session OT - End of Session Equipment Utilized During Treatment: Gait belt;Other (comment) (RW) Activity Tolerance: Patient tolerated treatment well Patient left: in chair;with call bell in reach;with family/visitor present;Other (comment) (sister and  brother-in-law from [facility]) General Behavior During Session: [facility] for tasks performed Cognition: Impaired   Evette Georges, OTR/L [phone] 07/12/2011, 2:12 PM

## 2011-07-12 NOTE — Progress Notes (Signed)
Subjective: 2 Days Post-Op Procedure(s) (LRB): ARTHROPLASTY BIPOLAR HIP (Right) OPEN REDUCTION INTERNAL FIXATION (ORIF) WRIST FRACTURE (Right) Patient reports pain as 0 on 0-10 scale and mild.   Hyponatremic stabilizing today. Slight decrease Hgb is alert and oriented x3 today.  Objective: Vital signs in last 24 hours: Temp:  [98.3 F (36.8 C)-98.6 F (37 C)] 98.6 F (37 C) (11/24 0413) Pulse Rate:  [88-98] 98  (11/24 0413) Resp:  [14-16] 16  (11/24 0800) BP: (102-117)/(65-77) 102/65 mmHg (11/24 0413) SpO2:  [96 %-98 %] 98 % (11/24 0800) Weight:  [43.092 kg (95 lb)] 95 lb (43.092 kg) (11/23 2045)  Intake/Output from previous day: 11/23 0701 - 11/24 0700 In: 220 [P.O.:220] Out: 275 [Urine:275] Intake/Output this shift:     Basename 07/12/11 0413 07/11/11 0410 07/10/11 0025  HGB 8.5* 9.9* 13.0    Basename 07/12/11 0413 07/11/11 0410  WBC 6.5 7.2  RBC 2.54* 2.98*  HCT 23.9* 28.1*  PLT 112* 100*    Basename 07/12/11 0413 07/11/11 0410  NA 129* 161*096*  K 4.0 4.24.3  CL 95* 95*95*  CO2 27 2623  BUN 15 1111  CREATININE 0.48* 0.43*0.44*  GLUCOSE 94 124*122*  CALCIUM 8.3* 8.2*8.2*   No results found for this basename: LABPT:2,INR:2 in the last 72 hours  Neurovascular intact Sensation intact distally Intact pulses distally Dorsiflexion/Plantar flexion intact Incision: dressing C/D/I and no drainage No cellulitis present   Assessment/Plan: 2 Days Post-Op Procedure(s) (LRB): ARTHROPLASTY BIPOLAR HIP (Right) OPEN REDUCTION INTERNAL FIXATION (ORIF) WRIST FRACTURE (Right) Up with therapy Dressing on hip changed today  Likely d/c to SNF Monday  Margart Sickles 07/12/2011, 11:54 AM

## 2011-07-12 NOTE — Progress Notes (Signed)
Physical Therapy Treatment Patient Details Name: Laura Benitez MRN: [id number] DOB: December 01, 1928 Today's Date: 07/12/2011 1230 - 1300  2GT PT Assessment/Plan  PT - Assessment/Plan PT Plan: Discharge plan remains appropriate PT Frequency: Min 3X/week Follow Up Recommendations: Skilled nursing facility Equipment Recommended: Defer to next venue PT Goals  Acute Rehab PT Goals PT Goal: Supine/Side to Sit - Progress: Progressing toward goal PT Goal: Sit to Supine/Side - Progress: Progressing toward goal PT Transfer Goal: Sit to Stand/Stand to Sit - Progress: Progressing toward goal PT Goal: Ambulate - Progress: Progressing toward goal  PT Treatment Precautions/Restrictions  Precautions Precautions: Posterior Hip Precaution Booklet Issued:  (Sign hung in room) Required Braces or Orthoses: No Restrictions Weight Bearing Restrictions: Yes RUE Weight Bearing: Weight bear through elbow only Mobility (including Balance) Bed Mobility Bed Mobility: Yes Supine to Sit: 1: +2 Total assist Supine to Sit Details (indicate cue type and reason): pt=30% with HOB up approximately 30 degrees Sit to Supine - Right: Not Tested (comment) Transfers Sit to Stand: 3: Mod assist;From elevated surface;With upper extremity assist;From bed Stand to Sit: 1: +2 Total assist;With upper extremity assist;To chair/3-in-1 Stand to Sit Details: one to help control descent and cues for sequence and one to help extend RLE, pt=60% Ambulation/Gait Ambulation/Gait Assistance: 1: +2 Total assist Ambulation/Gait Assistance Details (indicate cue type and reason): cues for sequence, posture, position from RW, stride length, increased WB UEs. Ambulation Distance (Feet): 19 Feet Assistive device: Right platform walker Gait Pattern: Step-to pattern Gait velocity: slow    Exercise    End of Session PT - End of Session Equipment Utilized During Treatment: Gait belt Activity Tolerance: Patient tolerated treatment  well Patient left: in chair;with call bell in reach General Behavior During Session: [facility] for tasks performed Cognition: Impaired  Laura Benitez 07/12/2011, 2:43 PM

## 2011-07-13 LAB — CBC
HCT: 21.9 % — ABNORMAL LOW (ref 36.0–46.0)
Hemoglobin: 7.6 g/dL — ABNORMAL LOW (ref 12.0–15.0)
MCH: 32.9 pg (ref 26.0–34.0)
MCHC: 34.7 g/dL (ref 30.0–36.0)
RBC: 2.31 MIL/uL — ABNORMAL LOW (ref 3.87–5.11)

## 2011-07-13 LAB — BASIC METABOLIC PANEL
BUN: 14 mg/dL (ref 6–23)
CO2: 30 mEq/L (ref 19–32)
Calcium: 8.3 mg/dL — ABNORMAL LOW (ref 8.4–10.5)
GFR calc non Af Amer: >90 mL/min (ref >90)
Glucose, Bld: 84 mg/dL (ref 70–99)
Potassium: 3.8 mEq/L (ref 3.5–5.1)
Sodium: 131 mEq/L — ABNORMAL LOW (ref 135–145)

## 2011-07-13 MED ORDER — ENOXAPARIN SODIUM 30 MG/0.3ML ~~LOC~~ SOLN
30.0000 mg | SUBCUTANEOUS | Status: DC
  Administered 2011-07-14 – 2011-07-15 (×2): 30 mg via SUBCUTANEOUS
  Filled 2011-07-13 (×2): qty 0.3

## 2011-07-13 NOTE — Progress Notes (Signed)
Subjective: 3 Days Post-Op Procedure(s) (LRB): ARTHROPLASTY BIPOLAR HIP (Right) OPEN REDUCTION INTERNAL FIXATION (ORIF) WRIST FRACTURE (Right) Patient reports pain as mild.    Objective: Vital signs in last 24 hours: Temp:  [97.4 F (36.3 C)-98.3 F (36.8 C)] 98.2 F (36.8 C) (11/25 0600) Pulse Rate:  [79-103] 79  (11/25 0600) Resp:  [16-20] 16  (11/25 0600) BP: (100-115)/(66-73) 100/66 mmHg (11/25 0600) SpO2:  [83 %-100 %] 100 % (11/25 0600)  Intake/Output from previous day: 11/24 0701 - 11/25 0700 In: 240 [P.O.:240] Out: 1550 [Urine:1550] Intake/Output this shift:     Basename 07/13/11 0442 07/12/11 0413 07/11/11 0410  HGB 7.6* 8.5* 9.9*    Basename 07/13/11 0442 07/12/11 0413  WBC 4.8 6.5  RBC 2.31* 2.54*  HCT 21.9* 23.9*  PLT 111* 112*    Basename 07/13/11 0442 07/12/11 0413  NA 131* 129*  K 3.8 4.0  CL 96 95*  CO2 30 27  BUN 14 15  CREATININE 0.48* 0.48*  GLUCOSE 84 94  CALCIUM 8.3* 8.3*   No results found for this basename: LABPT:2,INR:2 in the last 72 hours  Neurologically intact  Assessment/Plan:will tranfuse 1 unit 3 Days Post-Op Procedure(s) (LRB): ARTHROPLASTY BIPOLAR HIP (Right) OPEN REDUCTION INTERNAL FIXATION (ORIF) WRIST FRACTURE (Right) Discharge to SNF  Perla Echavarria JR,W D 07/13/2011, 10:43 AM

## 2011-07-14 DIAGNOSIS — D62 Acute posthemorrhagic anemia: Secondary | ICD-10-CM

## 2011-07-14 LAB — BASIC METABOLIC PANEL
BUN: 13 mg/dL (ref 6–23)
CO2: 28 mEq/L (ref 19–32)
GFR calc non Af Amer: >90 mL/min (ref >90)
Glucose, Bld: 81 mg/dL (ref 70–99)
Potassium: 3.4 mEq/L — ABNORMAL LOW (ref 3.5–5.1)
Sodium: 134 mEq/L — ABNORMAL LOW (ref 135–145)

## 2011-07-14 LAB — CBC
Hemoglobin: 8.9 g/dL — ABNORMAL LOW (ref 12.0–15.0)
MCH: 33 pg (ref 26.0–34.0)
MCHC: 35 g/dL (ref 30.0–36.0)
MCV: 94.1 fL (ref 78.0–100.0)
RBC: 2.7 MIL/uL — ABNORMAL LOW (ref 3.87–5.11)

## 2011-07-14 LAB — TYPE AND SCREEN: Unit division: 0

## 2011-07-14 MED ORDER — DOCUSATE SODIUM 50 MG/5ML PO LIQD
100.0000 mg | Freq: Two times a day (BID) | ORAL | Status: DC
  Administered 2011-07-14 – 2011-07-15 (×3): 100 mg via ORAL
  Filled 2011-07-14 (×4): qty 10

## 2011-07-14 MED ORDER — ACETAMINOPHEN 325 MG PO TABS: 325.0000 mg | ORAL_TABLET | Freq: Four times a day (QID) | ORAL | Status: AC | PRN

## 2011-07-14 MED ORDER — DSS 100 MG PO CAPS: 100.0000 mg | ORAL_CAPSULE | Freq: Two times a day (BID) | ORAL | Status: AC

## 2011-07-14 MED ORDER — LORAZEPAM 1 MG PO TABS
1.0000 mg | ORAL_TABLET | Freq: Every evening | ORAL | Status: DC | PRN
  Administered 2011-07-14 – 2011-07-15 (×2): 1 mg via ORAL
  Filled 2011-07-14 (×2): qty 1

## 2011-07-14 MED ORDER — ENOXAPARIN SODIUM 30 MG/0.3ML ~~LOC~~ SOLN: 30.0000 mg | SUBCUTANEOUS | Status: DC

## 2011-07-14 MED ORDER — LORAZEPAM 1 MG PO TABS: 1.0000 mg | ORAL_TABLET | Freq: Every day | ORAL | Status: DC

## 2011-07-14 MED ORDER — HYDROCODONE-ACETAMINOPHEN 5-325 MG PO TABS: 1.0000 | ORAL_TABLET | Freq: Four times a day (QID) | ORAL | Status: AC | PRN

## 2011-07-14 MED ORDER — POLYETHYLENE GLYCOL 3350 17 G PO PACK: 17.0000 g | PACK | Freq: Every day | ORAL | Status: AC

## 2011-07-14 NOTE — Progress Notes (Signed)
CSW is assisting pt/care giver Laura Benitez [phone]) with DC planning to SNF. Pt requesting SNF placement at [facility] . No word yet if they have an available bed in their SNF unit. Pt does have other bed offers in case [facility] is unable to assist. Requested info has been faxed to [facility] . Awaiting prior auth from insurance. No word yet. Will continue to work towards D/C 11/27.

## 2011-07-14 NOTE — Progress Notes (Signed)
Incorrect time

## 2011-07-14 NOTE — Progress Notes (Signed)
[facility] will have a SNF bed for pt 11/27. [employer] will auth admission. Will assist with D/C planning to [facility] in am .

## 2011-07-14 NOTE — Progress Notes (Signed)
Physical Therapy Evaluation Below is the original evaluation performed by Mauro Kaufmann, PT on 07/11/11.   Patient Details Name: Laura Benitez MRN: [id number] DOB: [birth year] Today's Date: 07/14/2011  Problem List:  Patient Active Problem List  Diagnoses  . Femoral neck fracture  . Radius and ulna distal fracture  . Depression  . Anxiety  . Anemia associated with acute blood loss    Past Medical History:  Past Medical History  Diagnosis Date  . No pertinent past medical history   . Depression   . Anxiety   . Fracture of lumbar spine     History in 2000  . Allergy    Past Surgical History:  Past Surgical History  Procedure Date  . Tonsillectomy     @age  4  . Total abdominal hysterectomy     PT Assessment/Plan/Recommendation PT Assessment PT Recommendation/Assessment: Patient will need skilled PT in the acute care venue PT Problem List: Decreased strength;Decreased activity tolerance;Decreased balance;Decreased mobility;Decreased knowledge of use of DME;Decreased safety awareness;Decreased knowledge of precautions;Pain PT Therapy Diagnosis : Difficulty walking PT Plan PT Frequency: Min 3X/week PT Treatment/Interventions: DME instruction;Gait training;Functional mobility training;Therapeutic exercise;Patient/family education PT Recommendation Recommendations for Other Services: OT consult Follow Up Recommendations: Skilled nursing facility (Pt was in process of move to [employer] [facility] home) Equipment Recommended: Defer to next venue PT Goals  Acute Rehab PT Goals PT Goal Formulation: With patient Time For Goal Achievement: 7 days Pt will go Supine/Side to Sit: with mod assist Pt will go Sit to Supine/Side: with mod assist Pt will Transfer Sit to Stand/Stand to Sit: with mod assist Pt will Ambulate: 16 - 50 feet;with mod assist;with rolling walker;with cues (comment type and amount)  PT Evaluation Precautions/Restrictions  Precautions Precautions: Posterior  Hip Restrictions Weight Bearing Restrictions: No RUE Weight Bearing: Weight bearing as tolerated Prior Functioning  Home Living Lives With: Alone (Pt reports she was in process of moving to [facility]) Receives Help From: Personal care attendant (PArt time) Prior Function Level of Independence: Requires assistive device for independence Cognition Cognition Arousal/Alertness: Awake/alert Overall Cognitive Status: Impaired Memory: Appears impaired Memory Deficits:  (Unable to recall info from beginning to end of session) Orientation Level: Oriented to person;Oriented to situation Following Commands: Follows one step commands inconsistently Safety/Judgement: Decreased awareness of safety precautions Decreased Safety/Judgement: Other (comment) (decreased awareness of THP and restrictions) Safety/Judgement - Other Comments: Step by step required for all tasks with explanation as to why Problem Solving: Requires assistance for problem solving Sensation/Coordination   Extremity Assessment RUE Assessment RUE Assessment: Exceptions to [facility] (R wrist splinted - WB allowed on elbow) LUE Assessment LUE Assessment: Within Functional Limits RLE Assessment RLE Assessment:  (WFL following THP) LLE Assessment LLE Assessment: Within Functional Limits Mobility (including Balance) Bed Mobility Bed Mobility: Yes Supine to Sit: 1: +2 Total assist Transfers Transfers: Yes Sit to Stand: 1: +2 Total assist;From bed;With upper extremity assist (Pt 40 % from elevated bed ) Stand to Sit: 1: +2 Total assist;To chair/3-in-1;With armrests;Without upper extremity assist Ambulation/Gait Ambulation/Gait: Yes Ambulation/Gait Assistance: 1: +2 Total assist (pt 50%) Assistive device: Right platform walker Gait Pattern: Step-to pattern Gait velocity: slow    Exercise  Total Joint Exercises Ankle Circles/Pumps: AAROM;10 reps;Supine;Both Heel Slides: AAROM;10 reps;Supine;Right Hip ABduction/ADduction:  AAROM;10 reps;Supine;Right End of Session PT - End of Session Equipment Utilized During Treatment: Gait belt Activity Tolerance: Patient tolerated treatment well Patient left: in chair;with call bell in reach Nurse Communication: Mobility status for transfers;Mobility status for ambulation General Behavior During  Session: [facility] for tasks performed Cognition: Impaired (Memory issues)  Makenze Ellett,KATHrine E 07/14/2011, 10:15 AM

## 2011-07-14 NOTE — Discharge Planning (Signed)
ORTHOPAEDIC TRAUMA SERVICE DISCHARGE SUMMARY  Patient ID: RICHIE VADALA MRN: [id number] DOB/AGE: [birth year] 77 y.o.  Admit date: 07/09/2011 Discharge date: 07/14/2011  Admission Diagnoses: Right distal radius and ulna fractures        Right femoral neck fracture        Hypotonic hypovolemic hyponatremia        Depression        Anxiety  Discharge Diagnoses:  Principal Problem:  *Radius and ulna distal fracture   Right femoral neck fracture Active Problems:  Depression  Anxiety  Anemia associated with acute blood loss  Procedures:  ORIF right distal radius 07/10/2011  Right hip hemiarthroplasty 07/10/2011  Both procedures were performed by Dr. Doralee Albino. Handy   Discharged Condition: stable  Hospital Course: Ms. Nolton is a very pleasant 77 year old female who sustained a fall late on 07/09/2011. She was brought to was a long hospital for evaluation where she was found to have a right distal radius and right femoral neck fracture. Patient was seen and evaluated by Dr. Brynda Greathouse and Nathanial Rancher PA-C and was admitted to the orthopedic service. Dr. Carola Frost and myself saw the patient the following morning for preoperative evaluation regarding her right distal radius and right femoral neck fractures. Patient was also found to be hyponatremic on admission as well which we treated during her hospitalization. Given the fact that the patient lives alone and has some limited mobilization it was felt that her hyponatremia was secondary to hypovolemia/dehydration. Patient was deemed stable for surgery and was taken to the OR on 07/10/2011 for a right hip hemiarthroplasty and ORIF of her right distal radius. Patient tolerated the procedure very well and after a brief recovery from anesthesia in the PACU she was then transferred back up to the orthopedic floor for continued observation and pain control.  Since Ms. Haeberle lives alone we felt that a skilled nursing facility would be in the best interest of  the patient. [facility] and this was the location that the patient would prefer to go. Aside from her hyponatremia and expected acute blood loss anemia Ms. Flinders's hospital stay was relatively uncomplicated.  On postoperative day #1 patient was doing very well. She did work with physical therapy on postop day #1 and did walk a little bit. Upon arrival in the afternoon she was up in a chair eating without any complaints. She did not have any additional issues that she needed to make [place] aware of. Her vitals were stable as were her labs on postoperative day #1. The only exception was her sodium level which was down to 127. Again her hyponatremia was felt to be due to hypovolemia this was confirmed with a serum osmolality which demonstrated a serum osmolality of 270 mOsm/kg. therefore treatment consisted of gentle IV fluid hydration with normal saline and continued monitoring. Patient responded well to this and had gradual increase in her serum sodium levels. On 07/12/2011 her sodium was 129, on 07/13/2011 her sodium was 131 and on 07/14/2011 her sodium was 134. Again patient's hospital course was relatively uncomplicated she participated very well with physical therapy and occupational therapy. Pain was controlled very well throughout her entire hospitalization. On postoperative day #4 patient was deemed stable for discharge to skilled nursing facility.   Consults: none  Significant Diagnostic Studies: labs: 07/14/2011       Sodium: 134       Potassium: 3.4  07/13/2011       Hemoglobin: 7.6       Hematocrit: 21.9       A CBC for 07/14/2011 is pending at the time of dictation  Treatments: IV hydration, analgesia: acetaminophen and Norco, anticoagulation: LMW heparin, therapies: PT, OT and RN and surgery: ORIF right distal radius and right hip hemiarthroplasty on 07/10/2011   Discharge Exam: Blood pressure 110/76, pulse 70,  temperature 98.4 F (36.9 C), temperature source Oral, resp. rate 17, height 5' (1.524 m), weight 43.092 kg (95 lb), SpO2 100.00%.   Subjective:  4 Days Post-Op Procedure(s) (LRB):  ARTHROPLASTY BIPOLAR HIP (Right)  OPEN REDUCTION INTERNAL FIXATION (ORIF) WRIST FRACTURE (Right)   Doing well. No significant complaints.  Ready to go to skilled nursing facility.  No complaints of chest pain, shortness of breath, lightheadedness, dizziness.   Objective:  Current Vitals  Blood pressure 110/76, pulse 70, temperature 98.4 F (36.9 C), temperature source Oral, resp. rate 17, height 5' (1.524 m), weight 43.092 kg (95 lb), SpO2 100.00%.  Vital signs in last 24 hours:  Temp: [97.9 F (36.6 C)-98.4 F (36.9 C)] 98.4 F (36.9 C) (11/26 0630)  Pulse Rate: [70-95] 70 (11/26 0630)  Resp: [16-18] 17 (11/26 0630)  BP: (90-112)/(63-76) 110/76 mmHg (11/26 0630)  SpO2: [99 %-100 %] 100 % (11/26 0630)  Intake/Output from previous day:  11/25 0701 - 11/26 0700  In: 412.5 [I.V.:50; Blood:362.5]  Out: 810 [Urine:810]  LABS     Basename  07/13/11 0442  07/12/11 0413    HGB  7.6*  8.5*        Basename  07/13/11 0442  07/12/11 0413    WBC  4.8  6.5    RBC  2.31*  2.54*    HCT  21.9*  23.9*    PLT  111*  112*        Basename  07/14/11 0455  07/13/11 0442    NA  134*  131*    K  3.4*  3.8    CL  100  96    CO2  28  30    BUN  13  14    CREATININE  0.45*  0.48*    GLUCOSE  81  84    CALCIUM  8.1*  8.3*     No results found for this basename: LABPT:2,INR:2 in the last 72 hours   Physical Exam  Gen: Appears well, in no acute distress  Lungs: Clear bilaterally  Cardiac: S1 and S2  Abd: + Bowel sounds  Ext: Right upper extremity               Splint fitting well               Swelling very well controlled               Ecchymosis resolving nicely               Active motion of digits is intact, patient demonstrates excellent and range of motion and elbow range of motion               Radial, median, ulnar sensory functions are intact              Radial, median, ulnar motor function are intact. Anterior interosseous and posterior interosseous nerve motor function intact as well              Extremity is warm.  Imaging  No results found.  Assessment/Plan:  4 Days Post-Op Procedure(s) (LRB):  ARTHROPLASTY BIPOLAR HIP (Right)  OPEN REDUCTION INTERNAL FIXATION (ORIF) WRIST FRACTURE (Right)  77 year old female status post fall with right distal radius and right femoral neck fracture is postop day 4   1. right distal radius fracture status post ORIF  Nonweightbearing right wrist, Ok to weight-bear through elbow with platform walker  Continue with OT  Ice as needed  Sling for comfort only  Hand, elbow, shoulder motion as tolerated   2. right femoral neck fracture status post right hip hemiarthroplasty  Weight-bear as tolerated right lower extremity with the use of platform walker  Posterior hip precautions  Dressing changes as needed, clean with soap and water only. No ointments lotions or solution to be applied to surgical wound   3. hypotonic hypovolemic hyponatremia  Resolved with gentle rehydration  Stable   4. acute blood loss anemia  Check CBC before discharge  Patient currently asymptomatic  No further drop will discharge patient otherwise may transfuse   5. FEN  Diet as tolerated   6. DVT/PE prophylaxis  Continue with Lovenox for another 10-17 days   7. Activity  continue with PT/OT  Weight-bear as tolerated right elbow and right lower extremity  Posterior hip precautions  Platform walker for mobilization   8. Psych  History of depression and anxiety, appears to be stable   9. Disposition  Probable skilled nursing facility today pending CBC  Followup with orthopedics in 10-14 days         Disposition: Stable for discharge to skilled nursing facility  Discharge Orders    Future Orders Please Complete By Expires   Begin discharge  teaching      Discharge to SNF when bed available      Discontinue IV fluid when diet tolerated      Discontinue IV        Current Discharge Medication List    START taking these medications   Details  acetaminophen (TYLENOL) 325 MG tablet Take 1-2 tablets (325-650 mg total) by mouth every 6 (six) hours as needed (or Fever >/= 101). Qty: 90 tablet, Refills: 1    docusate sodium 100 MG CAPS Take 100 mg by mouth 2 (two) times daily. Qty: 30 capsule    enoxaparin (LOVENOX) 30 MG/0.3ML SOLN Inject 0.3 mLs (30 mg total) into the skin daily. Qty: 17 Syringe    HYDROcodone-acetaminophen (NORCO) 5-325 MG per tablet Take 1-2 tablets by mouth every 6 (six) hours as needed. Qty: 60 tablet, Refills: 1    polyethylene glycol (MIRALAX / GLYCOLAX) packet Take 17 g by mouth daily. Qty: 14 each, Refills: 1      CONTINUE these medications which have NOT CHANGED   Details  LORazepam (ATIVAN) 1 MG tablet Take 2 mg by mouth 2 (two) times daily. May take 1-2 tablets by mouth at bedtime        Follow-up Information    Follow up with HANDY,MICHAEL H in 10 days.   Contact information:   [address], [address] [place] [address] [phone]          additional discharge plans/instructions:    Ms. Benge has sustained significant injuries to both her right upper and right lower extremities. She will require a significant amount of assistance for her the next several weeks to months to facilitate mobilization as well as range of motion activities. She'll also require extensive amounts of therapy to prevent or retarded development of a deconditioning. She is at  risk for the development of deconditioning. We are able to achieve excellent fixation of her right distal radius the plate osteosynthesis and were also able to repair her femoral neck fracture of the right hip hemiarthroplasty. We were satisfied with the degree fixation of both fractures and are hopeful that the patient  will go on and heal uneventfully.   Ms. Ueda will be allowed to be weightbearing as tolerated on her right lower extremity and can weight-bear as tolerated to her right elbow with use of a platform walker. She will have posterior hip precautions in place for the next 3 months with gradual relaxation of these precautions at that time. She is encouraged to perform range of motion of her hand elbow and shoulder as she can tolerate she is also encouraged to perform range of motion of her right toes ankle knee and hip precaution restrictions.   I have included discharge instructions with the patient's discharge summary including wound care. Wound care should consist in daily dry dressing changes to her right hip. Her splint should not be removed to her right upper extremity at any time or for any reason. If the splint gets wet the nursing facility should contact the office at [phone]. No ointments lotions or solution should be applied to surgical wounds. These can cause the wound to dehisce which can then need to deep infection. Cleaning the wounds with soap and water is fine as long as air dried adequately afterwards.   We would like to see the patient back in 10-14 days for reevaluation at the office, with followup x-rays and removal of sutures from her Hip and wrist. We will likely place the patient in a short arm cast at that time as well. Should the nursing home have any questions or concerns regarding therapy or any additional care there her to contact the office at [phone]      Signed: Mearl Latin, PA-C 07/14/2011, 9:50 AM

## 2011-07-14 NOTE — Progress Notes (Signed)
Subjective: 4 Days Post-Op Procedure(s) (LRB): ARTHROPLASTY BIPOLAR HIP (Right) OPEN REDUCTION INTERNAL FIXATION (ORIF) WRIST FRACTURE (Right)    Doing well. No significant complaints. Ready to go to skilled nursing facility. No complaints of chest pain, shortness of breath, lightheadedness, dizziness.  Objective: Current Vitals Blood pressure 110/76, pulse 70, temperature 98.4 F (36.9 C), temperature source Oral, resp. rate 17, height 5' (1.524 m), weight 43.092 kg (95 lb), SpO2 100.00%. Vital signs in last 24 hours: Temp:  [97.9 F (36.6 C)-98.4 F (36.9 C)] 98.4 F (36.9 C) (11/26 0630) Pulse Rate:  [70-95] 70  (11/26 0630) Resp:  [16-18] 17  (11/26 0630) BP: (90-112)/(63-76) 110/76 mmHg (11/26 0630) SpO2:  [99 %-100 %] 100 % (11/26 0630)  Intake/Output from previous day: 11/25 0701 - 11/26 0700 In: 412.5 [I.V.:50; Blood:362.5] Out: 810 [Urine:810]  LABS  Basename 07/13/11 0442 07/12/11 0413  HGB 7.6* 8.5*    Basename 07/13/11 0442 07/12/11 0413  WBC 4.8 6.5  RBC 2.31* 2.54*  HCT 21.9* 23.9*  PLT 111* 112*    Basename 07/14/11 0455 07/13/11 0442  NA 134* 131*  K 3.4* 3.8  CL 100 96  CO2 28 30  BUN 13 14  CREATININE 0.45* 0.48*  GLUCOSE 81 84  CALCIUM 8.1* 8.3*   No results found for this basename: LABPT:2,INR:2 in the last 72 hours    Physical Exam  Gen: Appears well, in no acute distress Lungs: Clear bilaterally Cardiac: S1 and S2 Abd: + Bowel sounds Ext: Right upper extremity  Splint fitting well  Swelling very well controlled  Ecchymosis resolving nicely  Active motion of digits is intact, patient demonstrates excellent and range of motion and elbow range of motion  Radial, median, ulnar sensory functions are intact   Radial, median, ulnar motor function are intact. Anterior interosseous and posterior interosseous nerve motor function intact as well  Extremity is warm.  Imaging No results found.  Assessment/Plan: 4 Days Post-Op  Procedure(s) (LRB): ARTHROPLASTY BIPOLAR HIP (Right) OPEN REDUCTION INTERNAL FIXATION (ORIF) WRIST FRACTURE (Right)  77 year old female status post fall with right distal radius and right femoral neck fracture is postop day 4  1. right distal radius fracture status post ORIF  Nonweightbearing right wrist, Ok to weight-bear through elbow with platform walker  Continue with OT  Ice as needed  Sling for comfort only  Hand, elbow, shoulder motion as tolerated 2. right femoral neck fracture status post right hip hemiarthroplasty   Weight-bear as tolerated right lower extremity with the use of platform walker  Posterior hip precautions  Dressing changes as needed, clean with soap and water only. No ointments lotions or solution to be applied to surgical wound 3. hypotonic hypovolemic hyponatremia  Resolved with gentle rehydration  Stable 4. acute blood loss anemia    Check CBC before discharge   Patient currently asymptomatic   No further drop will discharge patient otherwise may transfuse  5. FEN   Diet as tolerated  6. DVT/PE prophylaxis   Continue with Lovenox for another 10-17 days 7. Activity    continue with PT/OT  Weight-bear as tolerated right elbow and right lower extremity  Posterior hip precautions  Platform walker for mobilization 8. Psych   History of depression and anxiety, appears to be stable 9. Disposition   Probable skilled nursing facility today pending CBC  Followup with orthopedics in 10-14 days   Mearl Latin, PA-C 07/14/2011, 9:25 AM

## 2011-07-14 NOTE — Progress Notes (Signed)
CM consult done. See CM notes in shadow chart.  Colleen Can [facility] RN BSN CCM [phone] 07/14/2011

## 2011-07-15 DIAGNOSIS — E871 Hypo-osmolality and hyponatremia: Secondary | ICD-10-CM

## 2011-07-15 DIAGNOSIS — S52599A Other fractures of lower end of unspecified radius, initial encounter for closed fracture: Secondary | ICD-10-CM

## 2011-07-15 DIAGNOSIS — D62 Acute posthemorrhagic anemia: Secondary | ICD-10-CM

## 2011-07-15 DIAGNOSIS — Z4789 Encounter for other orthopedic aftercare: Secondary | ICD-10-CM

## 2011-07-15 DIAGNOSIS — S52609A Unspecified fracture of lower end of unspecified ulna, initial encounter for closed fracture: Secondary | ICD-10-CM

## 2011-07-15 DIAGNOSIS — F329 Major depressive disorder, single episode, unspecified: Secondary | ICD-10-CM

## 2011-07-15 DIAGNOSIS — K5900 Constipation, unspecified: Secondary | ICD-10-CM

## 2011-07-15 HISTORY — DX: Acute posthemorrhagic anemia: D62

## 2011-07-15 HISTORY — DX: Major depressive disorder, single episode, unspecified: F32.9

## 2011-07-15 HISTORY — DX: Constipation, unspecified: K59.00

## 2011-07-15 HISTORY — DX: Encounter for other orthopedic aftercare: Z47.89

## 2011-07-15 HISTORY — DX: Other fractures of lower end of unspecified radius, initial encounter for closed fracture: S52.599A

## 2011-07-15 HISTORY — DX: Hypo-osmolality and hyponatremia: E87.1

## 2011-07-15 HISTORY — DX: Unspecified fracture of lower end of unspecified ulna, initial encounter for closed fracture: S52.609A

## 2011-07-15 MED ORDER — LORAZEPAM 1 MG PO TABS: 1.0000 mg | ORAL_TABLET | Freq: Every evening | ORAL | Status: AC | PRN

## 2011-07-15 NOTE — Progress Notes (Signed)
Pt made aware of follow up appointment and medications to continue taking. Patient sent to SNF via transporter as set up by social work. No changes in patient status from morning assessment.

## 2011-07-15 NOTE — Progress Notes (Signed)
PT attempted x 3 this am.  Pt initially eating b'fast.  On second two attempts, pt sleeping and unable to be roused sufficiently to participate with PT.

## 2011-07-15 NOTE — Progress Notes (Signed)
Pt D/C to [facility] via [facility] today. [employer] medicare provided auth for SNF and amb transport.

## 2011-07-16 DIAGNOSIS — B370 Candidal stomatitis: Secondary | ICD-10-CM

## 2011-07-16 DIAGNOSIS — M545 Low back pain, unspecified: Secondary | ICD-10-CM

## 2011-07-16 DIAGNOSIS — D485 Neoplasm of uncertain behavior of skin: Secondary | ICD-10-CM

## 2011-07-16 HISTORY — DX: Candidal stomatitis: B37.0

## 2011-07-16 HISTORY — DX: Low back pain, unspecified: M54.50

## 2011-07-16 HISTORY — DX: Neoplasm of uncertain behavior of skin: D48.5

## 2011-07-21 DIAGNOSIS — E039 Hypothyroidism, unspecified: Secondary | ICD-10-CM

## 2011-07-21 DIAGNOSIS — B3749 Other urogenital candidiasis: Secondary | ICD-10-CM

## 2011-07-21 DIAGNOSIS — K0381 Cracked tooth: Secondary | ICD-10-CM

## 2011-07-21 DIAGNOSIS — F070 Personality change due to known physiological condition: Secondary | ICD-10-CM

## 2011-07-21 HISTORY — DX: Personality change due to known physiological condition: F07.0

## 2011-07-21 HISTORY — DX: Cracked tooth: K03.81

## 2011-07-21 HISTORY — DX: Hypothyroidism, unspecified: E03.9

## 2011-07-21 HISTORY — DX: Other urogenital candidiasis: B37.49

## 2011-08-13 DIAGNOSIS — R070 Pain in throat: Secondary | ICD-10-CM

## 2011-08-13 HISTORY — DX: Pain in throat: R07.0

## 2011-09-09 ENCOUNTER — Encounter (HOSPITAL_COMMUNITY): Payer: Self-pay | Admitting: Orthopedic Surgery

## 2011-09-11 ENCOUNTER — Ambulatory Visit (INDEPENDENT_AMBULATORY_CARE_PROVIDER_SITE_OTHER): Payer: Medicare Other | Admitting: Internal Medicine

## 2011-09-11 ENCOUNTER — Encounter: Payer: Self-pay | Admitting: Internal Medicine

## 2011-09-11 DIAGNOSIS — K625 Hemorrhage of anus and rectum: Secondary | ICD-10-CM

## 2011-09-11 DIAGNOSIS — K5900 Constipation, unspecified: Secondary | ICD-10-CM

## 2011-09-11 MED ORDER — POLYETHYLENE GLYCOL 3350 17 G PO PACK: 17.0000 g | PACK | Freq: Every day | ORAL | Status: AC

## 2011-09-11 MED ORDER — SENNOSIDES 8.6 MG PO TABS: 1.0000 | ORAL_TABLET | Freq: Every day | ORAL | Status: DC

## 2011-09-11 NOTE — Progress Notes (Signed)
Subjective:    Patient ID: Laura Benitez, female    DOB: [birth year], 78 y.o.   MRN: [id number]  HPI Laura Benitez is an 78 yo female with PMH of anxiety and depression, right hip and wrist fracture status post repair in November 2012, anemia felt secondary to surgical procedure, who is seen in consultation at the request of Dr. Murray Hodgkins for evaluation of diarrhea. The patient is alone today. She is a resident of [facility]. The listed indication for her visit today is diarrhea, but Laura Benitez states her issues are quite the opposite. She reports ongoing issues with constipation and hard stool. She reports having bowel movements approximately every other day, and not feeling that they are "satisfactory". She reports frequent having to strain at stool. She also reports occasional scant bright red blood per rectum on the toilet tissue. She denies diarrhea, and states this has not been a problem for many many months. She does have MiraLAX listed on her medication list, though this is when necessary only. She does not recall taking this medication recently.  She denies abdominal pain. She reports her appetite is "very good" and that she "enjoys eating". She denies nausea and vomiting. No heartburn. No dysphagia or odynophagia. She does feel like she had a prior colonoscopy, but reports this was years ago. She does not recall history of colon polyps. She denies fever and chills. She does endorse dysuria.  Review of Systems Constitutional: Negative for fever, chills, night sweats, activity change, appetite change and unexpected weight change HEENT: Negative for sore throat, mouth sores and trouble swallowing. Eyes: Negative for visual disturbance Respiratory: Negative for cough, chest tightness and shortness of breath Cardiovascular: Negative for chest pain, palpitations and lower extremity swelling Gastrointestinal: See history of present illness Genitourinary: See HPI Musculoskeletal: Negative for back  pain, arthralgias and myalgias Skin: Negative for rash or color change Neurological: Negative for headaches, weakness, numbness Hematological: Negative for adenopathy, negative for easy bruising/bleeding Psychiatric/behavioral: Negative for depressed mood, negative for anxiety  Patient Active Problem List  Diagnoses  . Femoral neck fracture  . Radius and ulna distal fracture  . Depression  . Anxiety  . Anemia associated with acute blood loss  . Constipation   Past Surgical History  Procedure Date  . Tonsillectomy     @age  4  . Total abdominal hysterectomy   . Hip arthroplasty 07/10/2011    Procedure: ARTHROPLASTY BIPOLAR HIP;  Surgeon: Budd Palmer, MD;  Location: [facility];  Service: Orthopedics;  Laterality: Right;  . Orif wrist fracture 07/10/2011    Procedure: OPEN REDUCTION INTERNAL FIXATION (ORIF) WRIST FRACTURE;  Surgeon: Budd Palmer, MD;  Location: [facility];  Service: Orthopedics;  Laterality: Right;   Current Outpatient Prescriptions  Medication Sig Dispense Refill  . acetaminophen (MAPAP) 325 MG tablet Take 650 mg by mouth every 6 (six) hours as needed.      Marland Kitchen ascorbic acid (VITAMIN C) 1000 MG tablet Take 1,000 mg by mouth daily.      Marland Kitchen HYDROcodone-acetaminophen (NORCO) 5-325 MG per tablet Take 1 tablet by mouth every 6 (six) hours as needed.      Marland Kitchen levothyroxine (SYNTHROID, LEVOTHROID) 25 MCG tablet Take 25 mcg by mouth daily.      Marland Kitchen LORazepam (ATIVAN) 1 MG tablet Take 2 mg by mouth 2 (two) times daily. May take 1-2 tablets by mouth at bedtime       . polyethylene glycol (MIRALAX / GLYCOLAX) packet Take 17 g by mouth  daily.  14 each  0  . senna ([facility] SENNA) 8.6 MG tablet Take 1 tablet (8.6 mg total) by mouth daily.  30 tablet  0   Allergies  Allergen Reactions  . Milk-Related Compounds Other (See Comments)    Milk products cause patient to have migraine headaches.  Pt can tolerate butter.   Family History  Problem Relation Age of Onset  . Lung cancer Father   .  Colon cancer Neg Hx    Social History  . Marital Status: Single   Occupational History  . retired Runner, broadcasting/film/video    Social History Main Topics  . Smoking status: Never Smoker   . Smokeless tobacco: Never Used  . Alcohol Use: No  . Drug Use: No      Objective:   Physical Exam BP 90/68  Pulse 80 Constitutional: Elderly appearing white female, sitting in a wheelchair, in no acute distress HEENT: Normocephalic and atraumatic. Oropharynx is clear and moist. No oropharyngeal exudate. Conjunctivae are normal. Pupils are equal round and reactive to light. No scleral icterus. Neck: Neck supple. Trachea midline. Cardiovascular: Normal rate, regular rhythm and intact distal pulses. 2/6 systolic ejection murmur Pulmonary/chest: Effort normal and breath sounds normal. No wheezing, rales or rhonchi. Abdominal: Soft, nontender, nondistended. Bowel sounds active throughout. There are no masses palpable. No hepatosplenomegaly. Extremities: no clubbing, cyanosis, or edema Lymphadenopathy: No cervical adenopathy noted. Neurological: Alert and oriented to person place and time. Skin: Skin is warm and dry. No rashes noted. Psychiatric: Normal mood and affect. Behavior is normal.  Labs reviewed Stool studies dated 08/26/2011: Negative stool culture (no Salmonella, Shigella, Campylobacter or Yersinia), negative C. difficile toxin a and B CBC dated 08/14/2011: WBC 3.4, hemoglobin 12.0, hematocrit 36.8, MCV 99.2, platelet count 209 BMP dated 08/14/2011: Sodium 137 potassium 4.2 chloride 99 CO2 33 BUN 9 creatinine 0.51 glucose 86 calcium 8.7    Assessment & Plan:   78 yo female with PMH of anxiety and depression, right hip and wrist fracture status post repair in November 2012, anemia felt secondary to surgical procedure, who is seen in consultation at the request of Dr. Murray Hodgkins for evaluation of "diarrhea", however the patient endorses constipation.  1. Constipation -- the patient's biggest GI issue at  present seems to be constipation. She give a very thorough and seemingly accurate history.  She is having scant red blood per rectum, and this in fact may be related to hemorrhoidal disease. We did discuss how cannot exclude other lesions which could cause bleeding such as angioectasia, polyp or even tumor.  We did discuss the possibility of colonoscopy, and at present she is not it shouldn't pursuing this option. I do understand her opinion, and a reasonable start would be to prescribe medication to help improve constipation and see if her scant rectal bleeding resolves.  I recommended MiraLAX 17 g daily. I will write an order for this to be scheduled. If her stools are too loose she can use this every other day. I will also write a when necessary prescription for senna 1 tab daily as needed for constipation not better with MiraLAX. I would like to see her back in 4-6 weeks and reassess her response to therapy. Based on her CBC from late December 2012 her hemoglobin is normal and there is no evidence of microcytosis, which is reassuring.

## 2011-09-11 NOTE — Patient Instructions (Signed)
We have sent the following medications to your pharmacy for you to pick up at your convenience: Miralax and Senna  Dr. Rhea Belton would like to see you back in the office in 4-6wks

## 2011-09-25 DIAGNOSIS — R350 Frequency of micturition: Secondary | ICD-10-CM

## 2011-09-25 HISTORY — DX: Frequency of micturition: R35.0

## 2011-09-26 NOTE — Discharge Summary (Signed)
(Original note under discharge planning, done at time of discharge)  ORTHOPAEDIC TRAUMA SERVICE DISCHARGE SUMMARY   Patient ID:  Laura Benitez  MRN: [id number]  DOB/AGE: February 16, 1929 78 y.o.  Admit date: 07/09/2011  Discharge date: 07/14/2011  Admission Diagnoses: Right distal radius and ulna fractures  Right femoral neck fracture  Hypotonic hypovolemic hyponatremia  Depression  Anxiety  Discharge Diagnoses:  Principal Problem:  *Radius and ulna distal fracture  Right femoral neck fracture  Active Problems:  Depression  Anxiety  Anemia associated with acute blood loss  Procedures:  ORIF right distal radius 07/10/2011  Right hip hemiarthroplasty 07/10/2011  Both procedures were performed by Dr. Doralee Albino. Handy  Discharged Condition: stable  Hospital Course: Laura Benitez is a very pleasant 78 year old female who sustained a fall late on 07/09/2011. She was brought to was a long hospital for evaluation where she was found to have a right distal radius and right femoral neck fracture. Patient was seen and evaluated by Dr. Brynda Greathouse and Nathanial Rancher PA-C and was admitted to the orthopedic service. Dr. Carola Frost and myself saw the patient the following morning for preoperative evaluation regarding her right distal radius and right femoral neck fractures. Patient was also found to be hyponatremic on admission as well which we treated during her hospitalization. Given the fact that the patient lives alone and has some limited mobilization it was felt that her hyponatremia was secondary to hypovolemia/dehydration. Patient was deemed stable for surgery and was taken to the OR on 07/10/2011 for a right hip hemiarthroplasty and ORIF of her right distal radius. Patient tolerated the procedure very well and after a brief recovery from anesthesia in the PACU she was then transferred back up to the orthopedic floor for continued observation and pain control.  Since Laura Benitez lives alone we felt that a skilled nursing  facility would be in the best interest of the patient. [facility] and this was the location that the patient would prefer to go. Aside from her hyponatremia and expected acute blood loss anemia Laura Benitez's hospital stay was relatively uncomplicated.  On postoperative day #1 patient was doing very well. She did work with physical therapy on postop day #1 and did walk a little bit. Upon arrival in the afternoon she was up in a chair eating without any complaints. She did not have any additional issues that she needed to make [place] aware of. Her vitals were stable as were her labs on postoperative day #1. The only exception was her sodium level which was down to 127. Again her hyponatremia was felt to be due to hypovolemia this was confirmed with a serum osmolality which demonstrated a serum osmolality of 270 mOsm/kg. therefore treatment consisted of gentle IV fluid hydration with normal saline and continued monitoring. Patient responded well to this and had gradual increase in her serum sodium levels. On 07/12/2011 her sodium was 129, on 07/13/2011 her sodium was 131 and on 07/14/2011 her sodium was 134. Again patient's hospital course was relatively uncomplicated she participated very well with physical therapy and occupational therapy. Pain was controlled very well throughout her entire hospitalization. On postoperative day #4 patient was deemed stable for discharge to skilled nursing facility.  Consults: none  Significant Diagnostic Studies: labs: 07/14/2011  Sodium: 134  Potassium: 3.4  07/13/2011  Hemoglobin: 7.6  Hematocrit: 21.9  A CBC for 07/14/2011 is pending at the time of dictation  Treatments: IV hydration, analgesia:  acetaminophen and Norco, anticoagulation: LMW heparin, therapies: PT, OT and RN and surgery: ORIF right distal radius and right hip hemiarthroplasty on 07/10/2011  Discharge Exam:  Blood pressure 110/76, pulse 70,  temperature 98.4 F (36.9 C), temperature source Oral, resp. rate 17, height 5' (1.524 m), weight 43.092 kg (95 lb), SpO2 100.00%.  Subjective:  4 Days Post-Op Procedure(s) (LRB):  ARTHROPLASTY BIPOLAR HIP (Right)  OPEN REDUCTION INTERNAL FIXATION (ORIF) WRIST FRACTURE (Right)  Doing well. No significant complaints.  Ready to go to skilled nursing facility.  No complaints of chest pain, shortness of breath, lightheadedness, dizziness.  Objective:  Current Vitals  Blood pressure 110/76, pulse 70, temperature 98.4 F (36.9 C), temperature source Oral, resp. rate 17, height 5' (1.524 m), weight 43.092 kg (95 lb), SpO2 100.00%.  Vital signs in last 24 hours:  Temp: [97.9 F (36.6 C)-98.4 F (36.9 C)] 98.4 F (36.9 C) (11/26 0630)  Pulse Rate: [70-95] 70 (11/26 0630)  Resp: [16-18] 17 (11/26 0630)  BP: (90-112)/(63-76) 110/76 mmHg (11/26 0630)  SpO2: [99 %-100 %] 100 % (11/26 0630)  Intake/Output from previous day:  11/25 0701 - 11/26 0700  In: 412.5 [I.V.:50; Blood:362.5]  Out: 810 [Urine:810]  LABS    Basename  07/13/11 0442  07/12/11 0413    HGB  7.6*  8.5*       Basename  07/13/11 0442  07/12/11 0413    WBC  4.8  6.5    RBC  2.31*  2.54*    HCT  21.9*  23.9*    PLT  111*  112*       Basename  07/14/11 0455  07/13/11 0442    NA  134*  131*    K  3.4*  3.8    CL  100  96    CO2  28  30    BUN  13  14    CREATININE  0.45*  0.48*    GLUCOSE  81  84    CALCIUM  8.1*  8.3*    No results found for this basename: LABPT:2,INR:2 in the last 72 hours  Physical Exam  Gen: Appears well, in no acute distress  Lungs: Clear bilaterally  Cardiac: S1 and S2  Abd: + Bowel sounds  Ext: Right upper extremity  Splint fitting well  Swelling very well controlled  Ecchymosis resolving nicely  Active motion of digits is intact, patient demonstrates excellent and range of motion and elbow range of motion  Radial, median, ulnar sensory functions are intact  Radial, median, ulnar motor  function are intact. Anterior interosseous and posterior interosseous nerve motor function intact as well  Extremity is warm.  Imaging  No results found.  Assessment/Plan:  4 Days Post-Op Procedure(s) (LRB):  ARTHROPLASTY BIPOLAR HIP (Right)  OPEN REDUCTION INTERNAL FIXATION (ORIF) WRIST FRACTURE (Right)  78 year old female status post fall with right distal radius and right femoral neck fracture is postop day 4  1. right distal radius fracture status post ORIF  Nonweightbearing right wrist, Ok to weight-bear through elbow with platform walker  Continue with OT  Ice as needed  Sling for comfort only  Hand, elbow, shoulder motion as tolerated  2. right femoral neck fracture status post right hip hemiarthroplasty  Weight-bear as tolerated right lower extremity with the use of platform walker  Posterior hip precautions  Dressing changes as needed, clean with soap and water only. No ointments lotions or solution to be applied to surgical wound  3. hypotonic hypovolemic hyponatremia  Resolved with gentle rehydration  Stable  4. acute blood loss anemia  Check CBC before discharge  Patient currently asymptomatic  No further drop will discharge patient otherwise may transfuse  5. FEN  Diet as tolerated  6. DVT/PE prophylaxis  Continue with Lovenox for another 10-17 days  7. Activity  continue with PT/OT  Weight-bear as tolerated right elbow and right lower extremity  Posterior hip precautions  Platform walker for mobilization  8. Psych  History of depression and anxiety, appears to be stable  9. Disposition  Probable skilled nursing facility today pending CBC  Followup with orthopedics in 10-14 days      Disposition: Stable for discharge to skilled nursing facility  Discharge Orders    Future Orders  Please Complete By  Expires    Begin discharge teaching      Discharge to SNF when bed available      Discontinue IV fluid when diet tolerated      Discontinue IV         Current Discharge Medication List     START taking these medications    Details   acetaminophen (TYLENOL) 325 MG tablet  Take 1-2 tablets (325-650 mg total) by mouth every 6 (six) hours as needed (or Fever >/= 101).  Qty: 90 tablet, Refills: 1     docusate sodium 100 MG CAPS  Take 100 mg by mouth 2 (two) times daily.  Qty: 30 capsule     enoxaparin (LOVENOX) 30 MG/0.3ML SOLN  Inject 0.3 mLs (30 mg total) into the skin daily.  Qty: 17 Syringe     HYDROcodone-acetaminophen (NORCO) 5-325 MG per tablet  Take 1-2 tablets by mouth every 6 (six) hours as needed.  Qty: 60 tablet, Refills: 1     polyethylene glycol (MIRALAX / GLYCOLAX) packet  Take 17 g by mouth daily.  Qty: 14 each, Refills: 1       CONTINUE these medications which have NOT CHANGED    Details   LORazepam (ATIVAN) 1 MG tablet  Take 2 mg by mouth 2 (two) times daily. May take 1-2 tablets by mouth at bedtime        Follow-up Information    Follow up with HANDY,MICHAEL H in 10 days.    Contact information:    [address], [address]  [place] [address]  [phone]         additional discharge plans/instructions:  Laura Benitez has sustained significant injuries to both her right upper and right lower extremities. She will require a significant amount of assistance for her the next several weeks to months to facilitate mobilization as well as range of motion activities. She'll also require extensive amounts of therapy to prevent or retarded development of a deconditioning. She is at risk for the development of deconditioning. We are able to achieve excellent fixation of her right distal radius the plate osteosynthesis and were also able to repair her femoral neck fracture of the right hip hemiarthroplasty. We were satisfied with the degree fixation of both fractures and are hopeful that the patient will go on and heal uneventfully.  Laura Benitez will be allowed to be weightbearing as tolerated on her right  lower extremity and can weight-bear as tolerated to her right elbow with use of a platform walker. She will have posterior hip precautions in place for the next 3 months with gradual relaxation of these precautions at that time. She is encouraged to perform range of motion of her hand elbow  and shoulder as she can tolerate she is also encouraged to perform range of motion of her right toes ankle knee and hip precaution restrictions.  I have included discharge instructions with the patient's discharge summary including wound care. Wound care should consist in daily dry dressing changes to her right hip. Her splint should not be removed to her right upper extremity at any time or for any reason. If the splint gets wet the nursing facility should contact the office at [phone]. No ointments lotions or solution should be applied to surgical wounds. These can cause the wound to dehisce which can then need to deep infection. Cleaning the wounds with soap and water is fine as long as air dried adequately afterwards.  We would like to see the patient back in 10-14 days for reevaluation at the office, with followup x-rays and removal of sutures from her Hip and wrist. We will likely place the patient in a short arm cast at that time as well. Should the nursing home have any questions or concerns regarding therapy or any additional care there her to contact the office at [phone]  Signed:  Mearl Latin, PA-C  07/14/2011, 9:50 AM

## 2011-10-03 ENCOUNTER — Encounter: Payer: Self-pay | Admitting: Internal Medicine

## 2011-10-08 ENCOUNTER — Ambulatory Visit (INDEPENDENT_AMBULATORY_CARE_PROVIDER_SITE_OTHER): Payer: Medicare Other | Admitting: Internal Medicine

## 2011-10-08 ENCOUNTER — Encounter: Payer: Self-pay | Admitting: Internal Medicine

## 2011-10-08 DIAGNOSIS — K5900 Constipation, unspecified: Secondary | ICD-10-CM

## 2011-10-08 MED ORDER — SENNA-DOCUSATE SODIUM 8.6-50 MG PO TABS: 1.0000 | ORAL_TABLET | Freq: Every day | ORAL | Status: DC

## 2011-10-08 NOTE — Patient Instructions (Signed)
Discontinue miralax  Begin Senna 1 tab by mouth daily.

## 2011-10-08 NOTE — Progress Notes (Signed)
Subjective:    Patient ID: Laura Benitez, female    DOB: Sep 08, 1928, 78 y.o.   MRN: [id number]  HPI Mrs. Lowrimore is an 78 yo female with PMH of anxiety and depression, right hip and wrist fracture status post repair in November 2012, anemia felt secondary to surgical procedure, who is seen in followup after being seen about one month ago for constipation. She is alone today.  Her last visit I started her on MiraLAX daily, and she was having improvement in her constipation, however she felt her stools are too loose. She said that MiraLAX may be causing her "more harm than good". When she was on MiraLAX she was having soft stools 3-4 times a day. She denies diarrhea. She denies blood in her stool and melena. No abdominal pain. No nausea or vomiting. She reports being bothered by "the idea" of not having a bowel movement daily, but she reports on days she does not have a bowel movement she does not have any specific troubling symptoms. No fevers or chills  Review of Systems As per history of present illness, otherwise negative    Patient Active Problem List  Diagnoses  . Femoral neck fracture  . Radius and ulna distal fracture  . Depression  . Anxiety  . Anemia associated with acute blood loss  . Constipation   Current Outpatient Prescriptions  Medication Sig Dispense Refill  . acetaminophen (MAPAP) 325 MG tablet Take 650 mg by mouth every 6 (six) hours as needed.      Marland Kitchen ascorbic acid (VITAMIN C) 1000 MG tablet Take 1,000 mg by mouth daily.      Marland Kitchen HYDROcodone-acetaminophen (NORCO) 5-325 MG per tablet Take 1 tablet by mouth every 6 (six) hours as needed.      Marland Kitchen levothyroxine (SYNTHROID, LEVOTHROID) 25 MCG tablet Take 25 mcg by mouth daily.      Marland Kitchen LORazepam (ATIVAN) 1 MG tablet Take 2 mg by mouth 2 (two) times daily. May take 1-2 tablets by mouth at bedtime       . polyethylene glycol (MIRALAX / GLYCOLAX) packet Take 17 g by mouth. Every other day      . senna ([facility] SENNA) 8.6 MG tablet Take 1  tablet (8.6 mg total) by mouth daily.  30 tablet  0  . vitamin A 40981 UNIT capsule Take 10,000 Units by mouth daily.      . sennosides-docusate sodium (SENOKOT-S) 8.6-50 MG tablet Take 1 tablet by mouth daily.  30 tablet  6   Allergies  Allergen Reactions  . Milk-Related Compounds Other (See Comments)    Milk products cause patient to have migraine headaches.  Pt can tolerate butter.    Family History  Problem Relation Age of Onset  . Lung cancer Father   . Colon cancer Neg Hx     SH - reviewed and no change  Objective:   Physical Exam BP 110/70  Pulse 80  Ht 5' (1.524 m)  Wt 93 lb (42.185 kg)  BMI 18.16 kg/m2 Constitutional: Thin elderly appearing female in no acute distress HEENT: Normocephalic and atraumatic. Oropharynx is clear and moist.  Neck: Neck supple. Trachea midline. Cardiovascular: Normal rate, regular rhythm and intact distal pulses. No M/R/G Pulmonary/chest: Effort normal and breath sounds normal. No wheezing, rales or rhonchi. Abdominal: Soft, nontender, nondistended. Bowel sounds active throughout.  Extremities: no clubbing, cyanosis, or edema Skin: Skin is warm and dry. No rashes noted. Psychiatric: Normal mood and affect. Behavior is normal.   Assessment &  Plan:  78 yo female with PMH of anxiety and depression, right hip and wrist fracture status post repair in November 2012, anemia felt secondary to surgical procedure, who is seen in followup after being seen about one month ago for constipation.  1. Constipation -- it seems that MiraLAX was causing frequent loose stools, which was more of a problem for her. Therefore, we will discontinue the MiraLAX, and start senna one tablet daily. Hopefully this will be enough to maintain bowel regularity and avoid constipation without her having to frequent stooling.  I also have reassured her that she does not have to have a bowel movement daily, and hopefully this reassurance will prevent her from being stressed about  daily bowel movement.  She can add bisacodyl suppositories as needed for constipation not improved by senna.  She is without other alarm symptom at present, and therefore no further evaluation is felt necessary. We did briefly discuss colonoscopy, and she continues to want to avoid this. She can followup as needed. I've given her my card and she will call us with any problems or concerns.

## 2011-10-09 ENCOUNTER — Telehealth: Payer: Self-pay | Admitting: Internal Medicine

## 2011-10-10 NOTE — Telephone Encounter (Signed)
lmom for pt to call back

## 2011-10-13 ENCOUNTER — Telehealth: Payer: Self-pay | Admitting: Internal Medicine

## 2011-10-14 NOTE — Telephone Encounter (Signed)
Spoke with Rosey Bath and then the [employer] who dispenses meds. Tabitha reports pt thinks the med, Senna, is too strong and wants to change to QOD. Please fax new order if done to [phone] or [phone], Attn: T Ok to change to QOD Senna? Thanks.

## 2011-10-14 NOTE — Telephone Encounter (Signed)
Yes okay to change to every other day

## 2011-10-14 NOTE — Telephone Encounter (Signed)
Faxed order to [facility], attn:Teresa for Senna qod; fax [phone] or [phone]

## 2011-10-14 NOTE — Telephone Encounter (Signed)
Number left was for [facility]. Called and spoke with pt's nurse, Rosey Bath. She reports the pt has refused Senna for 2 days; she doesn't know why. She will get me the pt's room # so I can talk to her.

## 2011-10-14 NOTE — Telephone Encounter (Signed)
See 10/13/11 encounter

## 2011-10-20 DIAGNOSIS — I739 Peripheral vascular disease, unspecified: Secondary | ICD-10-CM

## 2011-10-20 HISTORY — DX: Peripheral vascular disease, unspecified: I73.9

## 2011-10-21 ENCOUNTER — Ambulatory Visit
Admission: RE | Admit: 2011-10-21 | Discharge: 2011-10-21 | Disposition: A | Discharge status: Home / Self Care | Payer: Medicare Other | Source: Ambulatory Visit | Attending: Gastroenterology | Admitting: Gastroenterology

## 2011-10-21 ENCOUNTER — Other Ambulatory Visit: Payer: Self-pay | Admitting: Gastroenterology

## 2011-10-21 DIAGNOSIS — R140 Abdominal distension (gaseous): Secondary | ICD-10-CM

## 2011-11-27 DIAGNOSIS — S8263XA Displaced fracture of lateral malleolus of unspecified fibula, initial encounter for closed fracture: Secondary | ICD-10-CM

## 2011-11-27 HISTORY — DX: Displaced fracture of lateral malleolus of unspecified fibula, initial encounter for closed fracture: S82.63XA

## 2011-12-02 ENCOUNTER — Encounter: Payer: Self-pay | Admitting: Cardiovascular Disease

## 2011-12-04 ENCOUNTER — Encounter: Payer: Self-pay | Admitting: Cardiovascular Disease

## 2011-12-04 DIAGNOSIS — J209 Acute bronchitis, unspecified: Secondary | ICD-10-CM

## 2011-12-04 HISTORY — DX: Acute bronchitis, unspecified: J20.9

## 2011-12-09 ENCOUNTER — Encounter: Payer: Self-pay | Admitting: Cardiovascular Disease

## 2011-12-10 DIAGNOSIS — I509 Heart failure, unspecified: Secondary | ICD-10-CM

## 2011-12-10 HISTORY — DX: Heart failure, unspecified: I50.9

## 2011-12-17 ENCOUNTER — Encounter: Payer: Self-pay | Admitting: Cardiovascular Disease

## 2011-12-17 ENCOUNTER — Ambulatory Visit (INDEPENDENT_AMBULATORY_CARE_PROVIDER_SITE_OTHER): Payer: Medicare Other | Admitting: Cardiovascular Disease

## 2011-12-17 VITALS — BP 100/74 | HR 80 | Ht 60.0 in | Wt 88.0 lb

## 2011-12-17 DIAGNOSIS — K5900 Constipation, unspecified: Secondary | ICD-10-CM

## 2011-12-17 DIAGNOSIS — I872 Venous insufficiency (chronic) (peripheral): Secondary | ICD-10-CM

## 2011-12-17 DIAGNOSIS — S72009A Fracture of unspecified part of neck of unspecified femur, initial encounter for closed fracture: Secondary | ICD-10-CM

## 2011-12-17 NOTE — Assessment & Plan Note (Signed)
Improved - continue miralax

## 2011-12-17 NOTE — Assessment & Plan Note (Signed)
She simply has benign dependant rubor from chronic venous insuficiency.  Color completely disappeared after elevating legs for 10 minutes.  Arterial pulses are normal No w/u needed.  Michaux pigmentation from hemosiderin deposit shows chronic nature of issue as confirmed by patient.  Elevate legs and use compression stockings  No need for cardiology F/U

## 2011-12-17 NOTE — Progress Notes (Signed)
Patient ID: Laura Benitez, female   DOB: Feb 15, 1929, 78 y.o.   MRN: [id number] 39 yo referred from [facility] for ? Circulation problems.  She has had chronic discoloration of LE;s  Potenza pigmentation and bluish discoloration in dependancy.  Previous femoral neck fracture fixed by Dr Carola Frost.  No ulcers, no pain in feet and no claudication symptoms.  Ambulates with walker.  No history of vascular disease, CAD.  Nonsmoker.  Recent visit to primary for constipation.  She appears to have some dementia as her memory is quite poor.  Denies history of DVT  ROS: Denies fever, malais, weight loss, blurry vision, decreased visual acuity, cough, sputum, SOB, hemoptysis, pleuritic pain, palpitaitons, heartburn, abdominal pain, melena, lower extremity edema, claudication, or rash.  All other systems reviewed and negative   General: Affect appropriate Frail elderly female HEENT: normal Neck supple with no adenopathy JVP normal no bruits no thyromegaly Lungs clear with no wheezing and good diaphragmatic motion Heart:  S1/S2 no murmur,rub, gallop or click PMI normal Abdomen: benighn, BS positve, no tenderness, no AAA no bruit.  No HSM or HJR Distal pulses intact with no bruits Trace edema venous insuf. Chronic with bilateral dependant rubor disappears with leg elevation Neuro non-focal Skin warm and dry No muscular weakness  Medications Current Outpatient Prescriptions  Medication Sig Dispense Refill  . acetaminophen (MAPAP) 325 MG tablet Take 650 mg by mouth every 6 (six) hours as needed.      Marland Kitchen HYDROcodone-acetaminophen (NORCO) 5-325 MG per tablet Take 1 tablet by mouth every 6 (six) hours as needed.      Marland Kitchen levothyroxine (SYNTHROID, LEVOTHROID) 50 MCG tablet Take 50 mcg by mouth daily.      Marland Kitchen LORazepam (ATIVAN) 1 MG tablet Take 2 mg by mouth 2 (two) times daily. May take 1-2 tablets by mouth at bedtime       . mirtazapine (REMERON) 15 MG tablet Take 15 mg by mouth at bedtime.      . Naproxen Sodium  (ALEVE) 220 MG CAPS Take by mouth as needed.      Jeralyn Bennett Shell 500 MG TABS Take 1 tablet by mouth 2 (two) times daily.      . vitamin A 27035 UNIT capsule Take 10,000 Units by mouth daily.        Allergies Milk-related compounds  Family History: Family History  Problem Relation Age of Onset  . Lung cancer Father   . Colon cancer Neg Hx     Social History: History   Social History  . Marital Status: Single    Spouse Name: N/A    Number of Children: 0  . Years of Education: N/A   Occupational History  . retired Runner, broadcasting/film/video    Social History Main Topics  . Smoking status: Never Smoker   . Smokeless tobacco: Never Used  . Alcohol Use: No  . Drug Use: No  . Sexually Active: Not Currently   Other Topics Concern  . Not on file   Social History Narrative  . No narrative on file    Electrocardiogram:  11/12  NSR rate 83 normal ECG  Assessment and Plan

## 2011-12-17 NOTE — Patient Instructions (Signed)
Your physician recommends that you schedule a follow-up appointment in: AS NEEDED  Your physician recommends that you continue on your current medications as directed. Please refer to the Current Medication list given to you today.  

## 2011-12-17 NOTE — Assessment & Plan Note (Signed)
PT/OT at [facility].  F/U Dr Carola Frost

## 2011-12-25 DIAGNOSIS — M810 Age-related osteoporosis without current pathological fracture: Secondary | ICD-10-CM

## 2011-12-25 HISTORY — DX: Age-related osteoporosis without current pathological fracture: M81.0

## 2012-01-30 DIAGNOSIS — R269 Unspecified abnormalities of gait and mobility: Secondary | ICD-10-CM

## 2012-01-30 HISTORY — DX: Unspecified abnormalities of gait and mobility: R26.9

## 2012-02-09 DIAGNOSIS — G9332 Myalgic encephalomyelitis/chronic fatigue syndrome: Secondary | ICD-10-CM

## 2012-02-09 DIAGNOSIS — R5382 Chronic fatigue, unspecified: Secondary | ICD-10-CM

## 2012-02-09 HISTORY — DX: Chronic fatigue, unspecified: R53.82

## 2012-02-09 HISTORY — DX: Myalgic encephalomyelitis/chronic fatigue syndrome: G93.32

## 2012-07-01 DIAGNOSIS — N318 Other neuromuscular dysfunction of bladder: Secondary | ICD-10-CM

## 2012-07-01 HISTORY — DX: Other neuromuscular dysfunction of bladder: N31.8

## 2012-10-07 LAB — HEPATIC FUNCTION PANEL: Alkaline Phosphatase: 104 U/L (ref 25–125)

## 2012-10-07 LAB — BASIC METABOLIC PANEL
BUN: 9 mg/dL (ref 4–21)
Glucose: 78 mg/dL
Potassium: 4.4 mmol/L (ref 3.4–5.3)
Sodium: 130 mmol/L — AB (ref 137–147)

## 2012-10-07 LAB — CBC AND DIFFERENTIAL: HCT: 33 % — AB (ref 36–46)

## 2012-10-14 DIAGNOSIS — L89101 Pressure ulcer of unspecified part of back, stage 1: Secondary | ICD-10-CM

## 2012-10-14 DIAGNOSIS — H9209 Otalgia, unspecified ear: Secondary | ICD-10-CM

## 2012-10-14 HISTORY — DX: Otalgia, unspecified ear: H92.09

## 2012-10-14 HISTORY — DX: Pressure ulcer of unspecified part of back, stage 1: L89.101

## 2013-01-06 ENCOUNTER — Non-Acute Institutional Stay: Payer: Medicare Other | Admitting: Nurse Practitioner

## 2013-01-06 VITALS — BP 100/68 | HR 72 | Ht 61.0 in | Wt 104.0 lb

## 2013-01-06 DIAGNOSIS — E039 Hypothyroidism, unspecified: Secondary | ICD-10-CM

## 2013-01-06 DIAGNOSIS — F411 Generalized anxiety disorder: Secondary | ICD-10-CM

## 2013-01-06 DIAGNOSIS — N318 Other neuromuscular dysfunction of bladder: Secondary | ICD-10-CM

## 2013-01-06 DIAGNOSIS — I872 Venous insufficiency (chronic) (peripheral): Secondary | ICD-10-CM

## 2013-01-06 DIAGNOSIS — D62 Acute posthemorrhagic anemia: Secondary | ICD-10-CM

## 2013-01-06 DIAGNOSIS — K219 Gastro-esophageal reflux disease without esophagitis: Secondary | ICD-10-CM

## 2013-01-06 DIAGNOSIS — F419 Anxiety disorder, unspecified: Secondary | ICD-10-CM

## 2013-01-06 DIAGNOSIS — E871 Hypo-osmolality and hyponatremia: Secondary | ICD-10-CM

## 2013-01-06 DIAGNOSIS — Z66 Do not resuscitate: Secondary | ICD-10-CM | POA: Insufficient documentation

## 2013-01-06 NOTE — Assessment & Plan Note (Signed)
Update CBC. 

## 2013-01-06 NOTE — Progress Notes (Signed)
Patient ID: Laura Benitez, female   DOB: 1928-12-04, 79 y.o.   MRN: [id number] Code Status:  Chief Complaint  Patient presents with  . Medical Managment of Chronic Issues    thyroid, depression, CHF     Allergies  Allergen Reactions  . Milk-Related Compounds Other (See Comments)    Milk products cause patient to have migraine headaches.  Pt can tolerate butter.    Chief Complaint  Patient presents with  . Medical Managment of Chronic Issues    thyroid, depression, CHF    HPI: Patient is a 79 y.o. female seen in the clinic at [facility] today for routine visit to manage her chronic medical conditions.  Problem List Items Addressed This Visit   Anxiety (Chronic)     Stable on Ativan 1mg  nightly for sleep    Relevant Medications      LORazepam (ATIVAN) 0.5 MG tablet   Anemia associated with acute blood loss     Update CBC    Venous insufficiency     Trace pedal edema presents with Furosemide 40mg  daily. BNP56.2 07/16/12. EF 65% 10/19/12. F/u BMP. Pigmented and BLE and dependent rubor     Relevant Medications      furosemide (LASIX) 40 MG tablet   Unspecified hypothyroidism     Corrected with Levothyroxine , last TSH 2.218 11/16/12    Hypertonicity of bladder     Better symptomatic controlled with Myrbetriq 25mg  daily. 2-3x/night.     GERD (gastroesophageal reflux disease)     Asymptomatic on Omeprazole 20mg     Relevant Medications      omeprazole (PRILOSEC) 20 MG capsule      psyllium (METAMUCIL) 58.6 % powder      simethicone (MYLICON) 125 MG chewable tablet   DNR (do not resuscitate)    Other Visit Diagnoses   Hyponatremia    -  Primary       Review of Systems:  Review of Systems  Constitutional: Negative for fever, chills, weight loss, malaise/fatigue and diaphoresis.  HENT: Positive for hearing loss. Negative for congestion, sore throat, neck pain and ear discharge.   Eyes: Negative for blurred vision, double vision, photophobia, pain, discharge  and redness.  Respiratory: Positive for shortness of breath (exertional. ). Negative for cough, sputum production and wheezing.   Cardiovascular: Positive for leg swelling. Negative for chest pain, palpitations, orthopnea, claudication and PND.  Gastrointestinal: Negative for heartburn, nausea, vomiting, abdominal pain, diarrhea and constipation.  Genitourinary: Positive for frequency. Negative for dysuria, urgency, hematuria and flank pain.  Musculoskeletal: Negative for myalgias, back pain, joint pain and falls.  Skin: Negative for itching and rash.  Neurological: Negative for dizziness, tingling, tremors, sensory change, speech change, focal weakness, seizures, loss of consciousness, weakness and headaches.  Endo/Heme/Allergies: Negative for environmental allergies. Does not bruise/bleed easily.  Psychiatric/Behavioral: Positive for memory loss. Negative for depression and hallucinations. The patient is not nervous/anxious and does not have insomnia.      Past Medical History  Diagnosis Date  . Anemia   . Depression   . Anxiety   . Fracture of lumbar spine     History in 2000  . Allergy   . Migraine headache   . GERD (gastroesophageal reflux disease)   . UTI (urinary tract infection)   . Otalgia, unspecified 10/14/2012  . Stage I pressure ulcer of back 10/14/2012  . Shortness of breath 10/14/2012  . 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 Chronic fatigue syndrome 02/09/2012  . Abnormality of gait  01/30/2012  . Senile osteoporosis 12/25/2011  . Congestive heart failure, unspecified 12/10/2011  . Acute bronchitis 12/04/2011  . Closed fracture of lateral malleolus 11/27/2011  . Edema 10/30/2011  . Peripheral vascular disease, unspecified 10/20/2011  . Urinary frequency 09/25/2011  . Throat pain 08/13/2011  . Candidiasis of other urogenital sites 07/21/2011  . Unspecified hypothyroidism 07/21/2011  . Personality change due to conditions classified elsewhere 07/21/2011  . Cracked tooth 07/21/2011   . Candidiasis of mouth 07/16/2011  . Neoplasm of uncertain behavior of skin 07/16/2011  . Lumbago 07/16/2011  . Dysphagia 07/16/2011  . Hyposmolality and/or hyponatremia 07/15/2011  . Acute posthemorrhagic anemia 07/15/2011  . Major depressive disorder, single episode, unspecified 07/15/2011  . Unspecified constipation 07/15/2011  . Pain in joint, pelvic region and thigh 07/15/2011  . Other closed fractures of distal end of radius (alone) 07/15/2011  . Closed fracture of distal end of ulna (alone) 07/15/2011  . Other orthopedic aftercare 07/15/2011   Past Surgical History  Procedure Laterality Date  . Tonsillectomy      @age  4  . Total abdominal hysterectomy    . Hip arthroplasty  07/10/2011    Procedure: ARTHROPLASTY BIPOLAR HIP;  Surgeon: Budd Palmer, MD;  Location: [facility];  Service: Orthopedics;  Laterality: Right;  . Orif wrist fracture  07/10/2011    Procedure: OPEN REDUCTION INTERNAL FIXATION (ORIF) WRIST FRACTURE;  Surgeon: Budd Palmer, MD;  Location: [facility];  Service: Orthopedics;  Laterality: Right;   Social History:   reports that she has never smoked. She has never used smokeless tobacco. She reports that she does not drink alcohol or use illicit drugs.  Family History  Problem Relation Age of Onset  . Lung cancer Father   . Colon cancer Neg Hx     Medications: Patient's Medications  New Prescriptions   No medications on file  Previous Medications   ACETAMINOPHEN (MAPAP) 325 MG TABLET    Take 650 mg by mouth. Every 12 hours as needed for mild pain   ACTONEL 150 MG TABLET    Take once a month   FLUTICASONE (FLONASE) 50 MCG/ACT NASAL SPRAY    2 sprays. Daily   FUROSEMIDE (LASIX) 40 MG TABLET    40 mg. Take one daily   LEVOTHYROXINE (SYNTHROID, LEVOTHROID) 50 MCG TABLET    Take 50 mcg by mouth daily.   LORATADINE (CLARITIN) 10 MG TABLET    Take 10 mg by mouth daily. As needed for allergies   LORAZEPAM (ATIVAN) 0.5 MG TABLET    Take 0.5 mg by mouth. Take  one tablet daily as needed for anxiety. Take 1mg  at bedtime   MYRBETRIQ 25 MG TB24    Take one tablet daily   NAPROXEN SODIUM (ALEVE) 220 MG CAPS    Take by mouth as needed.   OMEPRAZOLE (PRILOSEC) 20 MG CAPSULE    Take one daily   OYSTER SHELL 500 MG TABS    Take 2 tablets by mouth. Three times daily as needed   POLYETHYL GLYCOL-PROPYL GLYCOL (SYSTANE) 0.4-0.3 % SOLN    Apply 1 drop to eye. Both eyes as needed for comfort   PSYLLIUM (METAMUCIL) 58.6 % POWDER    Take 1 packet by mouth. Take teaspoonful mixed in 8oz fluide daily as needed   SIMETHICONE (MYLICON) 125 MG CHEWABLE TABLET    Chew 125 mg by mouth. Take one three  times daily before meals   SODIUM CHLORIDE (DEEP SEA NASAL SPRAY) 0.65 % NASAL SPRAY  Place 1 spray into the nose. Use 1 spray in each nostril as needed   VITAMIN A 54098 UNIT CAPSULE    Take 10,000 Units by mouth daily.  Modified Medications   No medications on file  Discontinued Medications   HYDROCODONE-ACETAMINOPHEN (NORCO) 5-325 MG PER TABLET    Take 1 tablet by mouth every 6 (six) hours as needed.   LORAZEPAM (ATIVAN) 1 MG TABLET    Take 2 mg by mouth 2 (two) times daily. May take 1-2 tablets by mouth at bedtime    MIRTAZAPINE (REMERON) 15 MG TABLET    Take 15 mg by mouth at bedtime.     Physical Exam: Physical Exam  Constitutional: She is oriented to person, place, and time. She appears well-developed and well-nourished. No distress.  HENT:  Head: Normocephalic and atraumatic.  Right Ear: External ear normal.  Left Ear: External ear normal.  Mouth/Throat: Oropharynx is clear and moist.  Eyes: Conjunctivae and EOM are normal. Pupils are equal, round, and reactive to light. Right eye exhibits no discharge. Left eye exhibits no discharge. No scleral icterus.  Neck: Normal range of motion. No JVD present. No tracheal deviation present. No thyromegaly present.  Cardiovascular: Normal rate, regular rhythm and normal heart sounds.  Exam reveals no friction rub.    No murmur heard. Pulmonary/Chest: Effort normal. No stridor. No respiratory distress. She has no wheezes. She has no rales. She exhibits no tenderness.  Abdominal: Soft. Bowel sounds are normal. She exhibits no distension and no mass. There is no tenderness. There is no guarding.  Musculoskeletal: She exhibits edema (BLE--trace. ). She exhibits no tenderness.  Ambulates with walker. Severe kyphosis.   Lymphadenopathy:    She has no cervical adenopathy.  Neurological: She is alert and oriented to person, place, and time. She has normal reflexes. She displays normal reflexes. No cranial nerve deficit. She exhibits normal muscle tone. Coordination normal.  Skin: Skin is warm and dry. She is not diaphoretic.  BLE pigmented change and dependent rubor seen   Psychiatric: She has a normal mood and affect. Her behavior is normal. Judgment and thought content normal.    Filed Vitals:   01/06/13 1503  BP: 100/68  Pulse: 72  Height: 5\' 1"  (1.549 m)  Weight: 104 lb (47.174 kg)      Labs reviewed: Basic Metabolic Panel:  Recent Labs  11/91/47 11/16/12  NA 130*  --   K 4.4  --   BUN 9  --   CREATININE 0.7  --   TSH  --  2.22   Liver Function Tests:  Recent Labs  10/07/12  AST 22  ALT 10  ALKPHOS 104   No results found for this basename: LIPASE, AMYLASE,  in the last 8760 hours No results found for this basename: AMMONIA,  in the last 8760 hours CBC:  Recent Labs  10/07/12  WBC 3.5  HGB 11.6*  HCT 33*  PLT 182   Lipid Panel: No results found for this basename: CHOL, HDL, LDLCALC, TRIG, CHOLHDL, LDLDIRECT,  in the last 8760 hours Anemia Panel: No results found for this basename: FOLATE, IRON, VITAMINB12,  in the last 8760 hours  Past Procedures:     Assessment/Plan Hyponatremia Last serum Na 130 10/07/12--update BMP  Unspecified hypothyroidism Corrected with Levothyroxine , last TSH 2.218 11/16/12  Hypertonicity of bladder Better symptomatic controlled with  Myrbetriq 25mg  daily. 2-3x/night.   Venous insufficiency Trace pedal edema presents with Furosemide 40mg  daily. BNP56.2 07/16/12. EF 65% 10/19/12. F/u  BMP. Pigmented and BLE and dependent rubor   Anxiety Stable on Ativan 1mg  nightly for sleep  Anemia associated with acute blood loss Update CBC  GERD (gastroesophageal reflux disease) Asymptomatic on Omeprazole 20mg     Family/ Staff Communication:  Observe the patient.   Goals of Care: AL  Labs/tests ordered: CBC and BMP

## 2013-01-06 NOTE — Assessment & Plan Note (Addendum)
Better symptomatic controlled with Myrbetriq 25mg daily. 2-3x/night.   

## 2013-01-06 NOTE — Assessment & Plan Note (Signed)
Stable on Ativan 1mg  nightly for sleep

## 2013-01-06 NOTE — Assessment & Plan Note (Signed)
Last serum Na 130 10/07/12--update BMP

## 2013-01-06 NOTE — Assessment & Plan Note (Signed)
Asymptomatic on Omeprazole 20mg   

## 2013-01-06 NOTE — Assessment & Plan Note (Signed)
Corrected with Levothyroxine , last TSH 2.218 11/16/12

## 2013-01-06 NOTE — Assessment & Plan Note (Addendum)
Trace pedal edema presents with Furosemide 40mg  daily. BNP56.2 07/16/12. EF 65% 10/19/12. F/u BMP. Pigmented and BLE and dependent rubor

## 2013-03-10 ENCOUNTER — Encounter: Payer: Self-pay | Admitting: Internal Medicine

## 2013-03-24 ENCOUNTER — Other Ambulatory Visit: Payer: Self-pay | Admitting: Geriatric Medicine

## 2013-03-24 MED ORDER — LORAZEPAM 0.5 MG PO TABS: ORAL_TABLET | ORAL | Status: DC

## 2013-03-31 ENCOUNTER — Encounter: Payer: Self-pay | Admitting: Nurse Practitioner

## 2013-04-29 ENCOUNTER — Encounter: Payer: Self-pay | Admitting: *Deleted

## 2013-09-28 ENCOUNTER — Encounter: Payer: Medicare Other | Admitting: Internal Medicine

## 2013-10-06 ENCOUNTER — Other Ambulatory Visit: Payer: Self-pay | Admitting: *Deleted

## 2013-10-06 MED ORDER — LORAZEPAM 1 MG PO TABS
ORAL_TABLET | ORAL | Status: DC
Start: 2013-10-06 — End: 2014-03-31

## 2013-10-06 NOTE — Telephone Encounter (Signed)
[facility] [place] 

## 2013-10-13 ENCOUNTER — Encounter: Payer: Self-pay | Admitting: Nurse Practitioner

## 2013-10-27 ENCOUNTER — Non-Acute Institutional Stay: Payer: Medicare Other | Admitting: Nurse Practitioner

## 2013-10-27 VITALS — BP 100/62 | HR 68 | Wt 106.0 lb

## 2013-10-27 DIAGNOSIS — Z8679 Personal history of other diseases of the circulatory system: Secondary | ICD-10-CM

## 2013-10-27 DIAGNOSIS — D62 Acute posthemorrhagic anemia: Secondary | ICD-10-CM

## 2013-10-27 DIAGNOSIS — E871 Hypo-osmolality and hyponatremia: Secondary | ICD-10-CM

## 2013-10-27 DIAGNOSIS — F411 Generalized anxiety disorder: Secondary | ICD-10-CM

## 2013-10-27 DIAGNOSIS — K219 Gastro-esophageal reflux disease without esophagitis: Secondary | ICD-10-CM

## 2013-10-27 DIAGNOSIS — F419 Anxiety disorder, unspecified: Secondary | ICD-10-CM

## 2013-10-27 DIAGNOSIS — E039 Hypothyroidism, unspecified: Secondary | ICD-10-CM

## 2013-10-27 DIAGNOSIS — K5900 Constipation, unspecified: Secondary | ICD-10-CM

## 2013-10-27 DIAGNOSIS — N318 Other neuromuscular dysfunction of bladder: Secondary | ICD-10-CM

## 2013-10-27 DIAGNOSIS — I872 Venous insufficiency (chronic) (peripheral): Secondary | ICD-10-CM

## 2013-10-27 NOTE — Progress Notes (Signed)
Patient ID: Laura Benitez, female   DOB: March 16, 1929, 80 y.o.   MRN: [id number]    Code Status: DNR  Chief Complaint  Patient presents with  . talk about Living Will    has questions about the form.   . Carotid Artery test    07/2007 had carotid artery test done by [facility], results indicated mild/moderate carotid artery blockage on right. They mailed her a letter, recommend 5 test to have done. Would it be ok to fast for 4 hours? Should she have this test done?      Allergies  Allergen Reactions  . Milk-Related Compounds Other (See Comments)    Milk products cause patient to have migraine headaches.  Pt can tolerate butter.    Chief Complaint  Patient presents with  . talk about Living Will    has questions about the form.   . Carotid Artery test    07/2007 had carotid artery test done by [facility], results indicated mild/moderate carotid artery blockage on right. They mailed her a letter, recommend 5 test to have done. Would it be ok to fast for 4 hours? Should she have this test done?     HPI: Patient is a 80 y.o. female seen in the clinic at [facility] today for routine visit to manage her chronic medical conditions.  Problem List Items Addressed This Visit   Venous insufficiency - Primary     trace pedal edema presents with Furosemide 40mg  daily. BNP56.2 07/16/12. EF 65% 10/19/12. F/u BMP Na 130, Bun/creat 9/0.7 09/2013. Pigmented and BLE and dependent rubor     Unspecified hypothyroidism     Corrected with Levothyroxine 64mcg, TSH 2.218 11/16/12, update TSH.      Hyponatremia     Last Na 130 09/2013-Furosemide 40mg  daily contributes to the problem, update CMP    Hypertonicity of bladder     Better symptomatic controlled with Myrbetriq 25mg  daily. 2-3x/night.     H/O carotid atherosclerosis     Per the patient. Desires to f/u Carotid artery [place] and update lipids. Denied sudden weakness, numbness, trouble speaking/seening, dizziness, or sudden severe HA.     GERD  (gastroesophageal reflux disease)     Asymptomatic on Omeprazole 20mg      Constipation     Stable, take Metamucil    Anxiety (Chronic)     Takes Lorazepam 1mg  to help sleep and rest.     Anemia associated with acute blood loss     Update CBC        Review of Systems:  Review of Systems  Constitutional: Negative for fever, chills, weight loss, malaise/fatigue and diaphoresis.  HENT: Positive for hearing loss. Negative for congestion, ear discharge and sore throat.   Eyes: Negative for blurred vision, double vision, photophobia, pain, discharge and redness.  Respiratory: Positive for shortness of breath (exertional. ). Negative for cough, sputum production and wheezing.   Cardiovascular: Positive for leg swelling. Negative for chest pain, palpitations, orthopnea, claudication and PND.  Gastrointestinal: Negative for heartburn, nausea, vomiting, abdominal pain, diarrhea and constipation.  Genitourinary: Positive for frequency. Negative for dysuria, urgency, hematuria and flank pain.  Musculoskeletal: Negative for back pain, falls, joint pain, myalgias and neck pain.  Skin: Negative for itching and rash.  Neurological: Negative for dizziness, tingling, tremors, sensory change, speech change, focal weakness, seizures, loss of consciousness, weakness and headaches.  Endo/Heme/Allergies: Negative for environmental allergies. Does not bruise/bleed easily.  Psychiatric/Behavioral: Positive for memory loss. Negative for depression and hallucinations.  The patient is not nervous/anxious and does not have insomnia.      Past Medical History  Diagnosis Date  . Anemia   . Depression   . Anxiety   . Fracture of lumbar spine     History in 2000  . Allergy   . Migraine headache   . GERD (gastroesophageal reflux disease)   . UTI (urinary tract infection)   . Otalgia, unspecified 10/14/2012  . Stage I pressure ulcer of back 10/14/2012  . Shortness of breath 10/14/2012  . Hypertonicity of  bladder 07/01/2012  . Chronic fatigue syndrome 02/09/2012  . Abnormality of gait 01/30/2012  . Senile osteoporosis 12/25/2011  . Congestive heart failure, unspecified 12/10/2011  . Acute bronchitis 12/04/2011  . Closed fracture of lateral malleolus 11/27/2011  . Edema 10/30/2011  . Peripheral vascular disease, unspecified 10/20/2011  . Urinary frequency 09/25/2011  . Throat pain 08/13/2011  . Candidiasis of other urogenital sites 07/21/2011  . Unspecified hypothyroidism 07/21/2011  . Personality change due to conditions classified elsewhere 07/21/2011  . Cracked tooth 07/21/2011  . Candidiasis of mouth 07/16/2011  . Neoplasm of uncertain behavior of skin 07/16/2011  . Lumbago 07/16/2011  . Dysphagia 07/16/2011  . Hyposmolality and/or hyponatremia 07/15/2011  . Acute posthemorrhagic anemia 07/15/2011  . Major depressive disorder, single episode, unspecified 07/15/2011  . Unspecified constipation 07/15/2011  . Pain in joint, pelvic region and thigh 07/15/2011  . Other closed fractures of distal end of radius (alone) 07/15/2011  . Closed fracture of distal end of ulna (alone) 07/15/2011  . Other orthopedic aftercare(V54.89) 07/15/2011   Past Surgical History  Procedure Laterality Date  . Tonsillectomy      @age  4  . Total abdominal hysterectomy    . Hip arthroplasty  07/10/2011    Procedure: ARTHROPLASTY BIPOLAR HIP;  Surgeon: Rozanna Box, MD;  Location: [facility];  Service: Orthopedics;  Laterality: Right;  . Orif wrist fracture  07/10/2011    Procedure: OPEN REDUCTION INTERNAL FIXATION (ORIF) WRIST FRACTURE;  Surgeon: Rozanna Box, MD;  Location: [facility];  Service: Orthopedics;  Laterality: Right;   Social History:   reports that she has never smoked. She has never used smokeless tobacco. She reports that she does not drink alcohol or use illicit drugs.  Family History  Problem Relation Age of Onset  . Lung cancer Father   . Colon cancer Neg Hx     Medications: Patient's Medications   New Prescriptions   No medications on file  Previous Medications   ACTONEL 150 MG TABLET    Take 150 mg by mouth every 30 (thirty) days. Take  1 tablet once a month on the 18th of the month.   ALUM & MAG HYDROXIDE-SIMETH (MINTOX) 563-875-64 MG/5ML SUSPENSION    Take 30 mLs by mouth 3 (three) times daily. Take 30 ml three times daily as needed for indigestion.   FLUTICASONE (FLONASE) 50 MCG/ACT NASAL SPRAY    Place 2 sprays into the nose daily. Daily   FUROSEMIDE (LASIX) 40 MG TABLET    Take 40 mg by mouth daily. Take one daily   LEVOTHYROXINE (SYNTHROID, LEVOTHROID) 50 MCG TABLET    Take 50 mcg by mouth daily. Take 1 tablet by mouth daily.   LORATADINE (CLARITIN) 10 MG TABLET    Take 10 mg by mouth daily. As needed for allergies   LORAZEPAM (ATIVAN) 1 MG TABLET    Take one tablet by mouth at bedtime for insomnia   MYRBETRIQ 25 MG TB24  Take 25 mg by mouth daily. Take one tablet daily   NAPROXEN SODIUM (ALEVE) 220 MG CAPS    Take by mouth as needed. Take 1 tablet by mouth twice daily.   OMEPRAZOLE (PRILOSEC) 20 MG CAPSULE    Take one daily   OYSTER SHELL 500 MG TABS    Take 2 tablets by mouth. Take 1 tablet by mouth twice daily.   POLYETHYL GLYCOL-PROPYL GLYCOL (SYSTANE) 0.4-0.3 % SOLN    Apply 1 drop to eye. Both eyes as needed for comfort   PSYLLIUM (METAMUCIL) 58.6 % POWDER    Take 1 packet by mouth. Take teaspoonful mixed in 8oz fluide daily as needed   SIMETHICONE (MYLICON) 0000000 MG CHEWABLE TABLET    Chew 125 mg by mouth. Take one three  times daily before meals   SODIUM CHLORIDE (DEEP SEA NASAL SPRAY) 0.65 % NASAL SPRAY    Place 1 spray into the nose. Use 1 spray in each nostril as needed   VITAMIN A 60454 UNIT CAPSULE    Take 10,000 Units by mouth daily.  Modified Medications   No medications on file  Discontinued Medications   LORAZEPAM (ATIVAN) 0.5 MG TABLET    Take one tablet by mouth at bedtime.     Physical Exam: Physical Exam  Constitutional: She is oriented to person,  place, and time. She appears well-developed and well-nourished. No distress.  HENT:  Head: Normocephalic and atraumatic.  Right Ear: External ear normal.  Left Ear: External ear normal.  Mouth/Throat: Oropharynx is clear and moist.  Eyes: Conjunctivae and EOM are normal. Pupils are equal, round, and reactive to light. Right eye exhibits no discharge. Left eye exhibits no discharge. No scleral icterus.  Neck: Normal range of motion. No JVD present. No tracheal deviation present. No thyromegaly present.  Cardiovascular: Normal rate, regular rhythm and normal heart sounds.  Exam reveals no friction rub.   No murmur heard. Pulmonary/Chest: Effort normal. No stridor. No respiratory distress. She has no wheezes. She has no rales. She exhibits no tenderness.  Abdominal: Soft. Bowel sounds are normal. She exhibits no distension and no mass. There is no tenderness. There is no guarding.  Musculoskeletal: She exhibits edema (BLE--trace. ). She exhibits no tenderness.  Ambulates with walker. Severe kyphosis.   Lymphadenopathy:    She has no cervical adenopathy.  Neurological: She is alert and oriented to person, place, and time. She has normal reflexes. She displays normal reflexes. No cranial nerve deficit. She exhibits normal muscle tone. Coordination normal.  Skin: Skin is warm and dry. She is not diaphoretic.  BLE pigmented change and dependent rubor seen   Psychiatric: She has a normal mood and affect. Her behavior is normal. Judgment and thought content normal.    Filed Vitals:   10/27/13 1558  BP: 100/62  Pulse: 68  Weight: 106 lb (48.081 kg)      Labs reviewed: Basic Metabolic Panel:  Recent Labs  11/16/12  TSH 2.22   Liver Function Tests: No results found for this basename: AST, ALT, ALKPHOS, BILITOT, PROT, ALBUMIN,  in the last 8760 hours No results found for this basename: LIPASE, AMYLASE,  in the last 8760 hours No results found for this basename: AMMONIA,  in the last 8760  hours CBC: No results found for this basename: WBC, NEUTROABS, HGB, HCT, MCV, PLT,  in the last 8760 hours Lipid Panel: No results found for this basename: CHOL, HDL, LDLCALC, TRIG, CHOLHDL, LDLDIRECT,  in the last 8760 hours Anemia Panel:  No results found for this basename: FOLATE, IRON, VITAMINB12,  in the last 8760 hours  Past Procedures:     Assessment/Plan Venous insufficiency trace pedal edema presents with Furosemide 40mg  daily. BNP56.2 07/16/12. EF 65% 10/19/12. F/u BMP Na 130, Bun/creat 9/0.7 09/2013. Pigmented and BLE and dependent rubor   Unspecified hypothyroidism Corrected with Levothyroxine 65mcg, TSH 2.218 11/16/12, update TSH.    Hyponatremia Last Na 130 09/2013-Furosemide 40mg  daily contributes to the problem, update CMP  Anemia associated with acute blood loss Update CBC   GERD (gastroesophageal reflux disease) Asymptomatic on Omeprazole 20mg    Constipation Stable, take Metamucil  H/O carotid atherosclerosis Per the patient. Desires to f/u Carotid artery [place] and update lipids. Denied sudden weakness, numbness, trouble speaking/seening, dizziness, or sudden severe HA.   Hypertonicity of bladder Better symptomatic controlled with Myrbetriq 25mg  daily. 2-3x/night.   Anxiety Takes Lorazepam 1mg  to help sleep and rest.     Family/ Staff Communication:  Observe the patient.   Goals of Care: AL  Labs/tests ordered: CBC, CMP, TSH, lipid panel, Carotid artery [place]

## 2013-10-29 DIAGNOSIS — Z8679 Personal history of other diseases of the circulatory system: Secondary | ICD-10-CM | POA: Insufficient documentation

## 2013-10-29 DIAGNOSIS — E871 Hypo-osmolality and hyponatremia: Secondary | ICD-10-CM | POA: Insufficient documentation

## 2013-10-29 NOTE — Assessment & Plan Note (Signed)
Stable, take Metamucil   

## 2013-10-29 NOTE — Assessment & Plan Note (Signed)
Asymptomatic on Omeprazole 20mg   

## 2013-10-29 NOTE — Assessment & Plan Note (Signed)
Better symptomatic controlled with Myrbetriq 25mg daily. 2-3x/night.   

## 2013-10-29 NOTE — Assessment & Plan Note (Signed)
Last Na 130 09/2013-Furosemide 40mg  daily contributes to the problem, update CMP

## 2013-10-29 NOTE — Assessment & Plan Note (Addendum)
trace pedal edema presents with Furosemide 40mg daily. BNP56.2 07/16/12. EF 65% 10/19/12. F/u BMP Na 130, Bun/creat 9/0.7 09/2013. Pigmented and BLE and dependent rubor.    

## 2013-10-29 NOTE — Assessment & Plan Note (Signed)
Takes Lorazepam 1mg to help sleep and rest.    

## 2013-10-29 NOTE — Assessment & Plan Note (Signed)
Corrected with Levothyroxine 68mcg, TSH 2.218 11/16/12, update TSH.

## 2013-10-29 NOTE — Assessment & Plan Note (Signed)
Per the patient. Desires to f/u Carotid artery [place] and update lipids. Denied sudden weakness, numbness, trouble speaking/seening, dizziness, or sudden severe HA.

## 2013-10-29 NOTE — Assessment & Plan Note (Signed)
Update CBC. 

## 2013-11-01 LAB — TSH: TSH: 1.87 u[IU]/mL (ref 0.41–5.90)

## 2013-11-01 LAB — BASIC METABOLIC PANEL
BUN: 12 mg/dL (ref 4–21)
Creatinine: 0.8 mg/dL (ref 0.5–1.1)
Glucose: 91 mg/dL
POTASSIUM: 3.5 mmol/L (ref 3.4–5.3)
SODIUM: 134 mmol/L — AB (ref 137–147)

## 2013-11-01 LAB — HEPATIC FUNCTION PANEL
ALT: 10 U/L (ref 7–35)
AST: 21 U/L (ref 13–35)
Alkaline Phosphatase: 88 U/L (ref 25–125)
Bilirubin, Total: 0.6 mg/dL

## 2013-11-01 LAB — CBC AND DIFFERENTIAL
HCT: 41 % (ref 36–46)
HEMOGLOBIN: 14 g/dL (ref 12.0–16.0)
Platelets: 182 10*3/uL (ref 150–399)
WBC: 4.3 10^3/mL

## 2014-01-26 ENCOUNTER — Non-Acute Institutional Stay: Payer: Medicare Other | Admitting: Nurse Practitioner

## 2014-01-26 ENCOUNTER — Encounter: Payer: Self-pay | Admitting: Nurse Practitioner

## 2014-01-26 DIAGNOSIS — F419 Anxiety disorder, unspecified: Secondary | ICD-10-CM

## 2014-01-26 DIAGNOSIS — N318 Other neuromuscular dysfunction of bladder: Secondary | ICD-10-CM

## 2014-01-26 DIAGNOSIS — R5381 Other malaise: Secondary | ICD-10-CM

## 2014-01-26 DIAGNOSIS — F411 Generalized anxiety disorder: Secondary | ICD-10-CM

## 2014-01-26 DIAGNOSIS — E039 Hypothyroidism, unspecified: Secondary | ICD-10-CM

## 2014-01-26 DIAGNOSIS — R5383 Other fatigue: Principal | ICD-10-CM

## 2014-01-26 DIAGNOSIS — D62 Acute posthemorrhagic anemia: Secondary | ICD-10-CM

## 2014-01-26 DIAGNOSIS — K219 Gastro-esophageal reflux disease without esophagitis: Secondary | ICD-10-CM

## 2014-01-26 DIAGNOSIS — E871 Hypo-osmolality and hyponatremia: Secondary | ICD-10-CM

## 2014-01-26 DIAGNOSIS — I872 Venous insufficiency (chronic) (peripheral): Secondary | ICD-10-CM

## 2014-01-26 NOTE — Assessment & Plan Note (Signed)
Resolved. Hgb 14/0 11/01/13

## 2014-01-26 NOTE — Assessment & Plan Note (Signed)
Corrected with Levothyroxine 3mcg, TSH 1.866 11/01/13

## 2014-01-26 NOTE — Assessment & Plan Note (Addendum)
trace pedal edema presents with Furosemide 40mg  daily. BNP56.2 07/16/12. EF 65% 10/19/12. F/u BMP Na 130, Bun/creat 9/0.7 09/2013. Pigmented and BLE and dependent rubor. Update CMP

## 2014-01-26 NOTE — Assessment & Plan Note (Signed)
Better symptomatic controlled with Myrbetriq 25mg  daily. 2-3x/night.

## 2014-01-26 NOTE — Assessment & Plan Note (Signed)
Asymptomatic on Omeprazole 20mg 

## 2014-01-26 NOTE — Assessment & Plan Note (Signed)
Last Na 130 09/2013-134 11/01/13. Furosemide 40mg  daily contributes to the problem. Update CMP

## 2014-01-26 NOTE — Progress Notes (Signed)
Patient ID: Laura Benitez, female   DOB: 09/19/1928, 80 y.o.   MRN: [id number]    Code Status: DNR  Chief Complaint  Patient presents with  . Medical Management of Chronic Issues  . Acute Visit    c/o fatigue     Allergies  Allergen Reactions  . Milk-Related Compounds Other (See Comments)    Milk products cause patient to have migraine headaches.  Pt can tolerate butter.    Chief Complaint  Patient presents with  . Medical Management of Chronic Issues  . Acute Visit    c/o fatigue    HPI: Patient is a 80 y.o. female seen in the AL at [facility] today for c/o fatigue and chronic medical conditions.  Problem List Items Addressed This Visit   Venous insufficiency     trace pedal edema presents with Furosemide 40mg  daily. BNP56.2 07/16/12. EF 65% 10/19/12. F/u BMP Na 130, Bun/creat 9/0.7 09/2013. Pigmented and BLE and dependent rubor. Update CMP     Unspecified hypothyroidism     Corrected with Levothyroxine 4mcg, TSH 1.866 11/01/13    Other malaise and fatigue - Primary     Will obtain Cath UA C/S, CBC, CMP    Hyponatremia     Last Na 130 09/2013-134 11/01/13. Furosemide 40mg  daily contributes to the problem. Update CMP     Hypertonicity of bladder     Better symptomatic controlled with Myrbetriq 25mg  daily. 2-3x/night.      GERD (gastroesophageal reflux disease)     Asymptomatic on Omeprazole 20mg       Anxiety (Chronic)     Takes Lorazepam 1mg  to help sleep and rest.      Anemia associated with acute blood loss     Resolved. Hgb 14/0 11/01/13       Review of Systems:  Review of Systems  Constitutional: Positive for malaise/fatigue. Negative for fever, chills, weight loss and diaphoresis.  HENT: Positive for hearing loss. Negative for congestion, ear discharge and sore throat.   Eyes: Negative for blurred vision, double vision, photophobia, pain, discharge and redness.  Respiratory: Positive for shortness of breath (exertional. ). Negative for cough,  sputum production and wheezing.   Cardiovascular: Positive for leg swelling. Negative for chest pain, palpitations, orthopnea, claudication and PND.       Trace edema BLE  Gastrointestinal: Negative for heartburn, nausea, vomiting, abdominal pain, diarrhea and constipation.  Genitourinary: Positive for frequency. Negative for dysuria, urgency, hematuria and flank pain.  Musculoskeletal: Negative for back pain, falls, joint pain, myalgias and neck pain.  Skin: Negative for itching and rash.  Neurological: Negative for dizziness, tingling, tremors, sensory change, speech change, focal weakness, seizures, loss of consciousness, weakness and headaches.  Endo/Heme/Allergies: Negative for environmental allergies. Does not bruise/bleed easily.  Psychiatric/Behavioral: Positive for memory loss. Negative for depression and hallucinations. The patient is not nervous/anxious and does not have insomnia.      Past Medical History  Diagnosis Date  . Anemia   . Depression   . Anxiety   . Fracture of lumbar spine     History in 2000  . Allergy   . Migraine headache   . GERD (gastroesophageal reflux disease)   . UTI (urinary tract infection)   . Otalgia, unspecified 10/14/2012  . Stage I pressure ulcer of back 10/14/2012  . Shortness of breath 10/14/2012  . 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 Chronic fatigue syndrome 02/09/2012  . Abnormality of gait 01/30/2012  . Senile osteoporosis 12/25/2011  . Congestive  heart failure, unspecified 12/10/2011  . Acute bronchitis 12/04/2011  . Closed fracture of lateral malleolus 11/27/2011  . Edema 10/30/2011  . Peripheral vascular disease, unspecified 10/20/2011  . Urinary frequency 09/25/2011  . Throat pain 08/13/2011  . Candidiasis of other urogenital sites 07/21/2011  . Unspecified hypothyroidism 07/21/2011  . Personality change due to conditions classified elsewhere 07/21/2011  . Cracked tooth 07/21/2011  . Candidiasis of mouth 07/16/2011  . Neoplasm of uncertain  behavior of skin 07/16/2011  . Lumbago 07/16/2011  . Dysphagia 07/16/2011  . Hyposmolality and/or hyponatremia 07/15/2011  . Acute posthemorrhagic anemia 07/15/2011  . Major depressive disorder, single episode, unspecified 07/15/2011  . Unspecified constipation 07/15/2011  . Pain in joint, pelvic region and thigh 07/15/2011  . Other closed fractures of distal end of radius (alone) 07/15/2011  . Closed fracture of distal end of ulna (alone) 07/15/2011  . Other orthopedic aftercare(V54.89) 07/15/2011   Past Surgical History  Procedure Laterality Date  . Tonsillectomy      @age  4  . Total abdominal hysterectomy    . Hip arthroplasty  07/10/2011    Procedure: ARTHROPLASTY BIPOLAR HIP;  Surgeon: Rozanna Box, MD;  Location: [facility];  Service: Orthopedics;  Laterality: Right;  . Orif wrist fracture  07/10/2011    Procedure: OPEN REDUCTION INTERNAL FIXATION (ORIF) WRIST FRACTURE;  Surgeon: Rozanna Box, MD;  Location: [facility];  Service: Orthopedics;  Laterality: Right;   Social History:   reports that she has never smoked. She has never used smokeless tobacco. She reports that she does not drink alcohol or use illicit drugs.  Family History  Problem Relation Age of Onset  . Lung cancer Father   . Colon cancer Neg Hx     Medications: Patient's Medications  New Prescriptions   No medications on file  Previous Medications   ACTONEL 150 MG TABLET    Take 150 mg by mouth every 30 (thirty) days. Take  1 tablet once a month on the 18th of the month.   ALUM & MAG HYDROXIDE-SIMETH (MINTOX) 417-408-14 MG/5ML SUSPENSION    Take 30 mLs by mouth 3 (three) times daily. Take 30 ml three times daily as needed for indigestion.   FLUTICASONE (FLONASE) 50 MCG/ACT NASAL SPRAY    Place 2 sprays into the nose daily. Daily   FUROSEMIDE (LASIX) 40 MG TABLET    Take 40 mg by mouth daily. Take one daily   LEVOTHYROXINE (SYNTHROID, LEVOTHROID) 50 MCG TABLET    Take 50 mcg by mouth daily. Take 1 tablet by  mouth daily.   LORATADINE (CLARITIN) 10 MG TABLET    Take 10 mg by mouth daily. As needed for allergies   LORAZEPAM (ATIVAN) 1 MG TABLET    Take one tablet by mouth at bedtime for insomnia   MYRBETRIQ 25 MG TB24    Take 25 mg by mouth daily. Take one tablet daily   NAPROXEN SODIUM (ALEVE) 220 MG CAPS    Take by mouth as needed. Take 1 tablet by mouth twice daily.   OMEPRAZOLE (PRILOSEC) 20 MG CAPSULE    Take one daily   OYSTER SHELL 500 MG TABS    Take 2 tablets by mouth. Take 1 tablet by mouth twice daily.   POLYETHYL GLYCOL-PROPYL GLYCOL (SYSTANE) 0.4-0.3 % SOLN    Apply 1 drop to eye. Both eyes as needed for comfort   PSYLLIUM (METAMUCIL) 58.6 % POWDER    Take 1 packet by mouth. Take teaspoonful mixed in 8oz fluide daily  as needed   SIMETHICONE (MYLICON) 557 MG CHEWABLE TABLET    Chew 125 mg by mouth. Take one three  times daily before meals   SODIUM CHLORIDE (DEEP SEA NASAL SPRAY) 0.65 % NASAL SPRAY    Place 1 spray into the nose. Use 1 spray in each nostril as needed   VITAMIN A 32202 UNIT CAPSULE    Take 10,000 Units by mouth daily.  Modified Medications   No medications on file  Discontinued Medications   No medications on file     Physical Exam: Physical Exam  Constitutional: She is oriented to person, place, and time. She appears well-developed and well-nourished. No distress.  HENT:  Head: Normocephalic and atraumatic.  Right Ear: External ear normal.  Left Ear: External ear normal.  Mouth/Throat: Oropharynx is clear and moist.  Eyes: Conjunctivae and EOM are normal. Pupils are equal, round, and reactive to light. Right eye exhibits no discharge. Left eye exhibits no discharge. No scleral icterus.  Neck: Normal range of motion. No JVD present. No tracheal deviation present. No thyromegaly present.  Cardiovascular: Normal rate, regular rhythm and normal heart sounds.  Exam reveals no friction rub.   No murmur heard. Pulmonary/Chest: Effort normal. No stridor. No respiratory  distress. She has no wheezes. She has no rales. She exhibits no tenderness.  Abdominal: Soft. Bowel sounds are normal. She exhibits no distension and no mass. There is no tenderness. There is no guarding.  Musculoskeletal: She exhibits edema (BLE--trace. ). She exhibits no tenderness.  Ambulates with walker. Severe kyphosis. Trace edema BLE  Lymphadenopathy:    She has no cervical adenopathy.  Neurological: She is alert and oriented to person, place, and time. She has normal reflexes. She displays normal reflexes. No cranial nerve deficit. She exhibits normal muscle tone. Coordination normal.  Skin: Skin is warm and dry. She is not diaphoretic.  BLE pigmented change and dependent rubor seen   Psychiatric: She has a normal mood and affect. Her behavior is normal. Judgment and thought content normal.    Filed Vitals:   01/26/14 1359  BP: 124/74  Pulse: 74  Temp: 98 F (36.7 C)  TempSrc: Tympanic  Resp: 18      Labs reviewed: Basic Metabolic Panel:  Recent Labs  11/01/13  NA 134*  K 3.5  BUN 12  CREATININE 0.8  TSH 1.87   Liver Function Tests:  Recent Labs  11/01/13  AST 21  ALT 10  ALKPHOS 88   CBC:  Recent Labs  11/01/13  WBC 4.3  HGB 14.0  HCT 41  PLT 182   Past Procedures:  10/14/12 EKG normal sinus rhythm  Assessment/Plan Unspecified hypothyroidism Corrected with Levothyroxine 24mcg, TSH 1.866 11/01/13  Hyponatremia Last Na 130 09/2013-134 11/01/13. Furosemide 40mg  daily contributes to the problem. Update CMP   Anemia associated with acute blood loss Resolved. Hgb 14/0 11/01/13  Anxiety Takes Lorazepam 1mg  to help sleep and rest.    Other malaise and fatigue Will obtain Cath UA C/S, CBC, CMP  Hypertonicity of bladder Better symptomatic controlled with Myrbetriq 25mg  daily. 2-3x/night.    GERD (gastroesophageal reflux disease) Asymptomatic on Omeprazole 20mg     Venous insufficiency trace pedal edema presents with Furosemide 40mg  daily.  BNP56.2 07/16/12. EF 65% 10/19/12. F/u BMP Na 130, Bun/creat 9/0.7 09/2013. Pigmented and BLE and dependent rubor. Update CMP     Family/ Staff Communication:  Observe the patient.   Goals of Care: AL  Labs/tests ordered: Cath UA C/S, CBC, CMP.

## 2014-01-26 NOTE — Assessment & Plan Note (Signed)
Takes Lorazepam 1mg to help sleep and rest.    

## 2014-01-26 NOTE — Assessment & Plan Note (Signed)
Will obtain Cath UA C/S, CBC, CMP

## 2014-01-31 LAB — CBC AND DIFFERENTIAL
HCT: 38 % (ref 36–46)
Hemoglobin: 13.1 g/dL (ref 12.0–16.0)
Platelets: 179 10*3/uL (ref 150–399)
WBC: 4.1 10^3/mL

## 2014-01-31 LAB — HEPATIC FUNCTION PANEL
ALT: 12 U/L (ref 7–35)
AST: 21 U/L (ref 13–35)
Alkaline Phosphatase: 85 U/L (ref 25–125)

## 2014-01-31 LAB — BASIC METABOLIC PANEL
BUN: 6 mg/dL (ref 4–21)
Creatinine: 0.7 mg/dL (ref 0.5–1.1)
GLUCOSE: 127 mg/dL
POTASSIUM: 3.8 mmol/L (ref 3.4–5.3)
SODIUM: 130 mmol/L — AB (ref 137–147)

## 2014-02-09 ENCOUNTER — Encounter: Payer: Self-pay | Admitting: Nurse Practitioner

## 2014-02-09 ENCOUNTER — Other Ambulatory Visit: Payer: Self-pay | Admitting: Nurse Practitioner

## 2014-03-31 ENCOUNTER — Other Ambulatory Visit: Payer: Self-pay | Admitting: *Deleted

## 2014-03-31 MED ORDER — LORAZEPAM 1 MG PO TABS: ORAL_TABLET | ORAL | Status: DC

## 2014-03-31 NOTE — Telephone Encounter (Signed)
[facility] 

## 2014-04-13 ENCOUNTER — Non-Acute Institutional Stay: Payer: Medicare Other | Admitting: Nurse Practitioner

## 2014-04-13 ENCOUNTER — Encounter: Payer: Self-pay | Admitting: Nurse Practitioner

## 2014-04-13 VITALS — BP 100/62 | HR 56 | Wt 106.0 lb

## 2014-04-13 DIAGNOSIS — N318 Other neuromuscular dysfunction of bladder: Secondary | ICD-10-CM

## 2014-04-13 DIAGNOSIS — F419 Anxiety disorder, unspecified: Secondary | ICD-10-CM

## 2014-04-13 DIAGNOSIS — Z299 Encounter for prophylactic measures, unspecified: Secondary | ICD-10-CM

## 2014-04-13 DIAGNOSIS — E871 Hypo-osmolality and hyponatremia: Secondary | ICD-10-CM

## 2014-04-13 DIAGNOSIS — F411 Generalized anxiety disorder: Secondary | ICD-10-CM

## 2014-04-13 DIAGNOSIS — K5900 Constipation, unspecified: Secondary | ICD-10-CM

## 2014-04-13 DIAGNOSIS — E039 Hypothyroidism, unspecified: Secondary | ICD-10-CM

## 2014-04-13 DIAGNOSIS — I872 Venous insufficiency (chronic) (peripheral): Secondary | ICD-10-CM

## 2014-04-13 DIAGNOSIS — K219 Gastro-esophageal reflux disease without esophagitis: Secondary | ICD-10-CM

## 2014-04-13 NOTE — Assessment & Plan Note (Signed)
Corrected with Levothyroxine 12mcg, TSH 1.866 11/01/13

## 2014-04-13 NOTE — Assessment & Plan Note (Signed)
Better symptomatic controlled with Myrbetriq 25mg daily. 0x/night.   

## 2014-04-13 NOTE — Assessment & Plan Note (Signed)
trace pedal edema presents with Furosemide 40mg daily. BNP56.2 07/16/12. EF 65% 10/19/12. F/u BMP Na 130, Bun/creat 9/0.7 09/2013. Pigmented and BLE and dependent rubor.    

## 2014-04-13 NOTE — Progress Notes (Signed)
Patient ID: Laura Benitez, female   DOB: 1929/03/04, 80 y.o.   MRN: [id number]    Code Status: DNR  Chief Complaint  Patient presents with  . Medical Management of Chronic Issues    thyroid, anemia, anxiety      Allergies  Allergen Reactions  . Milk-Related Compounds Other (See Comments)    Milk products cause patient to have migraine headaches.  Pt can tolerate butter.    Chief Complaint  Patient presents with  . Medical Management of Chronic Issues    thyroid, anemia, anxiety     HPI: Patient is a 80 y.o. female seen in the AL at [facility] today for chronic medical conditions.  Problem List Items Addressed This Visit   Anxiety (Chronic)     Takes Lorazepam 1mg  to help sleep and rest.       Constipation     Stable, take Metamucil     GERD (gastroesophageal reflux disease)     C/o indigestion, will increase Omeprazole 40mg  and change Naproxen to prn.       Hypertonicity of bladder     Better symptomatic controlled with Myrbetriq 25mg  daily. 0x/night.       Hyponatremia     Last Na 130 09/2013-134 11/01/13. Furosemide 40mg  daily contributes to the problem. Update BMP      Unspecified hypothyroidism     Corrected with Levothyroxine 32mcg, TSH 1.866 11/01/13     Venous insufficiency     trace pedal edema presents with Furosemide 40mg  daily. BNP56.2 07/16/12. EF 65% 10/19/12. F/u BMP Na 130, Bun/creat 9/0.7 09/2013. Pigmented and BLE and dependent rubor.        Other Visit Diagnoses   Preventive measure    -  Primary    Relevant Orders       DNR (Do Not Resuscitate)       Review of Systems:  Review of Systems  Constitutional: Positive for malaise/fatigue. Negative for fever, chills, weight loss and diaphoresis.  HENT: Positive for hearing loss. Negative for congestion, ear discharge and sore throat.   Eyes: Negative for blurred vision, double vision, photophobia, pain, discharge and redness.  Respiratory: Negative for cough, sputum production,  shortness of breath (exertional. ) and wheezing.   Cardiovascular: Positive for leg swelling. Negative for chest pain, palpitations, orthopnea, claudication and PND.       Trace edema BLE  Gastrointestinal: Negative for heartburn, nausea, vomiting, abdominal pain, diarrhea and constipation.  Genitourinary: Negative for dysuria, urgency, frequency, hematuria and flank pain.       Sleeps through night since Myrbetriq  Musculoskeletal: Negative for back pain, falls, joint pain, myalgias and neck pain.  Skin: Negative for itching and rash.       Pigmented BLE.   Neurological: Negative for dizziness, tingling, tremors, sensory change, speech change, focal weakness, seizures, loss of consciousness, weakness and headaches.  Endo/Heme/Allergies: Negative for environmental allergies. Does not bruise/bleed easily.  Psychiatric/Behavioral: Positive for memory loss. Negative for depression and hallucinations. The patient is not nervous/anxious and does not have insomnia.      Past Medical History  Diagnosis Date  . Anemia   . Depression   . Anxiety   . Fracture of lumbar spine     History in 2000  . Allergy   . Migraine headache   . GERD (gastroesophageal reflux disease)   . UTI (urinary tract infection)   . Otalgia, unspecified 10/14/2012  . Stage I pressure ulcer of back 10/14/2012  . Shortness of  breath 10/14/2012  . 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 Chronic fatigue syndrome 02/09/2012  . Abnormality of gait 01/30/2012  . Senile osteoporosis 12/25/2011  . Congestive heart failure, unspecified 12/10/2011  . Acute bronchitis 12/04/2011  . Closed fracture of lateral malleolus 11/27/2011  . Edema 10/30/2011  . Peripheral vascular disease, unspecified 10/20/2011  . Urinary frequency 09/25/2011  . Throat pain 08/13/2011  . Candidiasis of other urogenital sites 07/21/2011  . Unspecified hypothyroidism 07/21/2011  . Personality change due to conditions classified elsewhere 07/21/2011  . Cracked tooth  07/21/2011  . Candidiasis of mouth 07/16/2011  . Neoplasm of uncertain behavior of skin 07/16/2011  . Lumbago 07/16/2011  . Dysphagia 07/16/2011  . Hyposmolality and/or hyponatremia 07/15/2011  . Acute posthemorrhagic anemia 07/15/2011  . Major depressive disorder, single episode, unspecified 07/15/2011  . Unspecified constipation 07/15/2011  . Pain in joint, pelvic region and thigh 07/15/2011  . Other closed fractures of distal end of radius (alone) 07/15/2011  . Closed fracture of distal end of ulna (alone) 07/15/2011  . Other orthopedic aftercare(V54.89) 07/15/2011   Past Surgical History  Procedure Laterality Date  . Tonsillectomy      @age  4  . Total abdominal hysterectomy    . Hip arthroplasty  07/10/2011    Procedure: ARTHROPLASTY BIPOLAR HIP;  Surgeon: Rozanna Box, MD;  Location: [facility];  Service: Orthopedics;  Laterality: Right;  . Orif wrist fracture  07/10/2011    Procedure: OPEN REDUCTION INTERNAL FIXATION (ORIF) WRIST FRACTURE;  Surgeon: Rozanna Box, MD;  Location: [facility];  Service: Orthopedics;  Laterality: Right;   Social History:   reports that she has never smoked. She has never used smokeless tobacco. She reports that she does not drink alcohol or use illicit drugs.  Family History  Problem Relation Age of Onset  . Lung cancer Father   . Colon cancer Neg Hx     Medications: Patient's Medications  New Prescriptions   No medications on file  Previous Medications   ACTONEL 150 MG TABLET    Take 150 mg by mouth every 30 (thirty) days. Take  1 tablet once a month on the 18th of the month.   ALUM & MAG HYDROXIDE-SIMETH (MINTOX) 194-174-08 MG/5ML SUSPENSION    Take 30 mLs by mouth 3 (three) times daily. Take 30 ml three times daily as needed for indigestion.   FLUTICASONE (FLONASE) 50 MCG/ACT NASAL SPRAY    Place 2 sprays into the nose daily. Daily   FUROSEMIDE (LASIX) 40 MG TABLET    Take 40 mg by mouth daily. Take one daily   LEVOTHYROXINE (SYNTHROID,  LEVOTHROID) 50 MCG TABLET    Take 50 mcg by mouth daily. Take 1 tablet by mouth daily.   LORATADINE (CLARITIN) 10 MG TABLET    Take 10 mg by mouth daily. As needed for allergies   LORAZEPAM (ATIVAN) 1 MG TABLET    Take one tablet by mouth at bedtime for insomnia   MYRBETRIQ 25 MG TB24    Take 25 mg by mouth daily. Take one tablet daily   NAPROXEN SODIUM (ALEVE) 220 MG CAPS    Take by mouth as needed. Take 1 tablet by mouth twice daily.   OMEPRAZOLE (PRILOSEC) 20 MG CAPSULE    Take one daily   OYSTER SHELL 500 MG TABS    Take 2 tablets by mouth. Take 1 tablet by mouth twice daily.   POLYETHYL GLYCOL-PROPYL GLYCOL (SYSTANE) 0.4-0.3 % SOLN    Apply 1 drop to eye.  Both eyes as needed for comfort   PSYLLIUM (METAMUCIL) 58.6 % POWDER    Take 1 packet by mouth. Take teaspoonful mixed in 8oz fluide daily as needed   SIMETHICONE (MYLICON) 546 MG CHEWABLE TABLET    Chew 125 mg by mouth. Take one three  times daily before meals   SODIUM CHLORIDE (DEEP SEA NASAL SPRAY) 0.65 % NASAL SPRAY    Place 1 spray into the nose. Use 1 spray in each nostril as needed   VITAMIN A 56812 UNIT CAPSULE    Take 10,000 Units by mouth daily.  Modified Medications   No medications on file  Discontinued Medications   No medications on file     Physical Exam: Physical Exam  Constitutional: She is oriented to person, place, and time. She appears well-developed and well-nourished. No distress.  HENT:  Head: Normocephalic and atraumatic.  Right Ear: External ear normal.  Left Ear: External ear normal.  Mouth/Throat: Oropharynx is clear and moist.  Eyes: Conjunctivae and EOM are normal. Pupils are equal, round, and reactive to light. Right eye exhibits no discharge. Left eye exhibits no discharge. No scleral icterus.  Neck: Normal range of motion. No JVD present. No tracheal deviation present. No thyromegaly present.  Cardiovascular: Normal rate, regular rhythm and normal heart sounds.  Exam reveals no friction rub.   No  murmur heard. Pulmonary/Chest: Effort normal. No stridor. No respiratory distress. She has no wheezes. She has no rales. She exhibits no tenderness.  Abdominal: Soft. Bowel sounds are normal. She exhibits no distension and no mass. There is no tenderness. There is no guarding.  Musculoskeletal: She exhibits edema (BLE--trace. ). She exhibits no tenderness.  Ambulates with walker. Severe kyphosis. Trace edema BLE  Lymphadenopathy:    She has no cervical adenopathy.  Neurological: She is alert and oriented to person, place, and time. She has normal reflexes. She displays normal reflexes. No cranial nerve deficit. She exhibits normal muscle tone. Coordination normal.  Skin: Skin is warm and dry. She is not diaphoretic.  BLE pigmented change and dependent rubor seen   Psychiatric: She has a normal mood and affect. Her behavior is normal. Judgment and thought content normal.    Filed Vitals:   04/13/14 1407  BP: 100/62  Pulse: 56  Weight: 106 lb (48.081 kg)      Labs reviewed: Basic Metabolic Panel:  Recent Labs  11/01/13 01/31/14  NA 134* 130*  K 3.5 3.8  BUN 12 6  CREATININE 0.8 0.7  TSH 1.87  --    Liver Function Tests:  Recent Labs  11/01/13 01/31/14  AST 21 21  ALT 10 12  ALKPHOS 88 85   CBC:  Recent Labs  11/01/13 01/31/14  WBC 4.3 4.1  HGB 14.0 13.1  HCT 41 38  PLT 182 179   Past Procedures:  10/14/12 EKG normal sinus rhythm  Assessment/Plan GERD (gastroesophageal reflux disease) C/o indigestion, will increase Omeprazole 40mg  and change Naproxen to prn.     Hypertonicity of bladder Better symptomatic controlled with Myrbetriq 25mg  daily. 0x/night.     Venous insufficiency trace pedal edema presents with Furosemide 40mg  daily. BNP56.2 07/16/12. EF 65% 10/19/12. F/u BMP Na 130, Bun/creat 9/0.7 09/2013. Pigmented and BLE and dependent rubor.     Unspecified hypothyroidism Corrected with Levothyroxine 35mcg, TSH 1.866 11/01/13   Anxiety Takes  Lorazepam 1mg  to help sleep and rest.     Constipation Stable, take Metamucil   Hyponatremia Last Na 130 09/2013-134 11/01/13. Furosemide 40mg  daily  contributes to the problem. Update BMP      Family/ Staff Communication:  Observe the patient.   Goals of Care: AL  Labs/tests ordered: BMP

## 2014-04-13 NOTE — Assessment & Plan Note (Signed)
C/o indigestion, will increase Omeprazole 40mg  and change Naproxen to prn.

## 2014-04-13 NOTE — Assessment & Plan Note (Signed)
Takes Lorazepam 1mg to help sleep and rest.    

## 2014-04-13 NOTE — Assessment & Plan Note (Signed)
Last Na 130 09/2013-134 11/01/13. Furosemide 40mg  daily contributes to the problem. Update BMP

## 2014-04-13 NOTE — Assessment & Plan Note (Signed)
Stable, take Metamucil   

## 2014-04-18 LAB — BASIC METABOLIC PANEL
BUN: 6 mg/dL (ref 4–21)
Creatinine: 0.7 mg/dL (ref 0.5–1.1)
GLUCOSE: 83 mg/dL
POTASSIUM: 3.6 mmol/L (ref 3.4–5.3)
SODIUM: 136 mmol/L — AB (ref 137–147)

## 2014-04-20 ENCOUNTER — Other Ambulatory Visit: Payer: Self-pay | Admitting: Nurse Practitioner

## 2014-04-20 DIAGNOSIS — E871 Hypo-osmolality and hyponatremia: Secondary | ICD-10-CM

## 2014-10-12 ENCOUNTER — Encounter: Payer: Self-pay | Admitting: Nurse Practitioner

## 2014-10-12 ENCOUNTER — Non-Acute Institutional Stay: Payer: Medicare Other | Admitting: Nurse Practitioner

## 2014-10-12 VITALS — BP 100/64 | HR 60 | Temp 97.5°F | Wt 102.0 lb

## 2014-10-12 DIAGNOSIS — D62 Acute posthemorrhagic anemia: Secondary | ICD-10-CM

## 2014-10-12 DIAGNOSIS — E871 Hypo-osmolality and hyponatremia: Secondary | ICD-10-CM

## 2014-10-12 DIAGNOSIS — E039 Hypothyroidism, unspecified: Secondary | ICD-10-CM

## 2014-10-12 DIAGNOSIS — N318 Other neuromuscular dysfunction of bladder: Secondary | ICD-10-CM

## 2014-10-12 DIAGNOSIS — L22 Diaper dermatitis: Secondary | ICD-10-CM

## 2014-10-12 DIAGNOSIS — I872 Venous insufficiency (chronic) (peripheral): Secondary | ICD-10-CM

## 2014-10-12 DIAGNOSIS — K219 Gastro-esophageal reflux disease without esophagitis: Secondary | ICD-10-CM

## 2014-10-12 DIAGNOSIS — F419 Anxiety disorder, unspecified: Secondary | ICD-10-CM

## 2014-10-12 DIAGNOSIS — K5900 Constipation, unspecified: Secondary | ICD-10-CM

## 2014-10-12 DIAGNOSIS — B372 Candidiasis of skin and nail: Secondary | ICD-10-CM

## 2014-10-12 NOTE — Assessment & Plan Note (Signed)
Better symptomatic controlled with Myrbetriq 25mg  daily. 0x/night.

## 2014-10-12 NOTE — Assessment & Plan Note (Signed)
Update CBC-hx of blood loss anemia after hip surgery.

## 2014-10-12 NOTE — Assessment & Plan Note (Signed)
trace pedal edema presents with Furosemide 40mg  daily. BNP56.2 07/16/12. EF 65% 10/19/12. F/u BMP Na 130, Bun/creat 9/0.7 09/2013. Pigmented and BLE and dependent rubor.

## 2014-10-12 NOTE — Assessment & Plan Note (Signed)
Takes Lorazepam 1mg  to help sleep and rest.

## 2014-10-12 NOTE — Assessment & Plan Note (Signed)
Corrected with Levothyroxine 72mcg, TSH 1.866 11/01/13. Update TSH

## 2014-10-12 NOTE — Assessment & Plan Note (Signed)
Stable, take Metamucil

## 2014-10-12 NOTE — Assessment & Plan Note (Signed)
11/01/13 Na 134 01/31/14 Na 130 04/18/14 Na 136 10/12/14 update CMP

## 2014-10-12 NOTE — Assessment & Plan Note (Signed)
Stable, continue Omeprazole 40mg  and Naproxen to prn.

## 2014-10-12 NOTE — Progress Notes (Signed)
Patient ID: Laura Benitez, female   DOB: 14-Sep-1928, 81 y.o.   MRN: [id number]    Code Status: DNR  Chief Complaint  Patient presents with  . Medical Management of Chronic Issues    thyroid, anxiety, GERD     Allergies  Allergen Reactions  . Milk-Related Compounds Other (See Comments)    Milk products cause patient to have migraine headaches.  Pt can tolerate butter.    Chief Complaint  Patient presents with  . Medical Management of Chronic Issues    thyroid, anxiety, GERD    HPI: Patient is a 81 y.o. female seen in the clinic at [facility] today for chronic medical conditions.  Problem List Items Addressed This Visit    Venous insufficiency    trace pedal edema presents with Furosemide 40mg  daily. BNP56.2 07/16/12. EF 65% 10/19/12. F/u BMP Na 130, Bun/creat 9/0.7 09/2013. Pigmented and BLE and dependent rubor.         Hypothyroidism - Primary    Corrected with Levothyroxine 91mcg, TSH 1.866 11/01/13. Update TSH        Hyponatremia    11/01/13 Na 134 01/31/14 Na 130 04/18/14 Na 136 10/12/14 update CMP       Hypertonicity of bladder    Better symptomatic controlled with Myrbetriq 25mg  daily. 0x/night.        GERD (gastroesophageal reflux disease)    Stable, continue Omeprazole 40mg  and Naproxen to prn.         Diaper candidiasis   Constipation    Stable, take Metamucil        Anxiety (Chronic)    Takes Lorazepam 1mg  to help sleep and rest.         Anemia associated with acute blood loss    Update CBC-hx of blood loss anemia after hip surgery.          Review of Systems:  Review of Systems  Skin: Positive for rash.     Past Medical History  Diagnosis Date  . Anemia   . Depression   . Anxiety   . Fracture of lumbar spine     History in 2000  . Allergy   . Migraine headache   . GERD (gastroesophageal reflux disease)   . UTI (urinary tract infection)   . Otalgia, unspecified 10/14/2012  . Stage I pressure ulcer of back 10/14/2012   . Shortness of breath 10/14/2012  . 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 Chronic fatigue syndrome 02/09/2012  . Abnormality of gait 01/30/2012  . Senile osteoporosis 12/25/2011  . Congestive heart failure, unspecified 12/10/2011  . Acute bronchitis 12/04/2011  . Closed fracture of lateral malleolus 11/27/2011  . Edema 10/30/2011  . Peripheral vascular disease, unspecified 10/20/2011  . Urinary frequency 09/25/2011  . Throat pain 08/13/2011  . Candidiasis of other urogenital sites 07/21/2011  . Unspecified hypothyroidism 07/21/2011  . Personality change due to conditions classified elsewhere 07/21/2011  . Cracked tooth 07/21/2011  . Candidiasis of mouth 07/16/2011  . Neoplasm of uncertain behavior of skin 07/16/2011  . Lumbago 07/16/2011  . Dysphagia 07/16/2011  . Hyposmolality and/or hyponatremia 07/15/2011  . Acute posthemorrhagic anemia 07/15/2011  . Major depressive disorder, single episode, unspecified 07/15/2011  . Unspecified constipation 07/15/2011  . Pain in joint, pelvic region and thigh 07/15/2011  . Other closed fractures of distal end of radius (alone) 07/15/2011  . Closed fracture of distal end of ulna (alone) 07/15/2011  . Other orthopedic aftercare(V54.89) 07/15/2011   Past Surgical History  Procedure Laterality  Date  . Tonsillectomy      @age  4  . Total abdominal hysterectomy    . Hip arthroplasty  07/10/2011    Procedure: ARTHROPLASTY BIPOLAR HIP;  Surgeon: Rozanna Box, MD;  Location: [facility];  Service: Orthopedics;  Laterality: Right;  . Orif wrist fracture  07/10/2011    Procedure: OPEN REDUCTION INTERNAL FIXATION (ORIF) WRIST FRACTURE;  Surgeon: Rozanna Box, MD;  Location: [facility];  Service: Orthopedics;  Laterality: Right;   Social History:   reports that she has never smoked. She has never used smokeless tobacco. She reports that she does not drink alcohol or use illicit drugs.  Family History  Problem Relation Age of Onset  . Lung cancer Father   . Colon  cancer Neg Hx     Medications: Patient's Medications  New Prescriptions   No medications on file  Previous Medications   ACTONEL 150 MG TABLET    Take 150 mg by mouth every 30 (thirty) days. Take  1 tablet once a month on the 18th of the month.   ALUM & MAG HYDROXIDE-SIMETH (MINTOX) 314-970-26 MG/5ML SUSPENSION    Take 30 mLs by mouth 3 (three) times daily. Take 30 ml three times daily as needed for indigestion.   FLUTICASONE (FLONASE) 50 MCG/ACT NASAL SPRAY    Place 2 sprays into the nose daily. Daily   FUROSEMIDE (LASIX) 40 MG TABLET    Take 40 mg by mouth daily. Take one daily   LEVOTHYROXINE (SYNTHROID, LEVOTHROID) 50 MCG TABLET    Take 50 mcg by mouth daily. Take 1 tablet by mouth daily.   LORAZEPAM (ATIVAN) 1 MG TABLET    Take one tablet by mouth at bedtime for insomnia   MYRBETRIQ 25 MG TB24    Take 25 mg by mouth daily. Take one tablet daily   NAPROXEN SODIUM (ALEVE) 220 MG CAPS    Take by mouth as needed. Take 1 tablet by mouth twice daily.   OMEPRAZOLE (PRILOSEC) 20 MG CAPSULE    Take one daily   OYSTER SHELL 500 MG TABS    Take 2 tablets by mouth. Take 1 tablet by mouth twice daily.   POLYETHYL GLYCOL-PROPYL GLYCOL (SYSTANE) 0.4-0.3 % SOLN    Apply 1 drop to eye. Both eyes as needed for comfort   PSYLLIUM (METAMUCIL) 58.6 % POWDER    Take 1 packet by mouth. Take teaspoonful mixed in 8oz fluide daily as needed   SIMETHICONE (MYLICON) 378 MG CHEWABLE TABLET    Chew 125 mg by mouth. Take one three  times daily before meals   SODIUM CHLORIDE (DEEP SEA NASAL SPRAY) 0.65 % NASAL SPRAY    Place 1 spray into the nose. Use 1 spray in each nostril as needed   VITAMIN A 58850 UNIT CAPSULE    Take 10,000 Units by mouth daily.  Modified Medications   No medications on file  Discontinued Medications   LORATADINE (CLARITIN) 10 MG TABLET    Take 10 mg by mouth daily. As needed for allergies     Physical Exam: Physical Exam  Constitutional: She is oriented to person, place, and time. She  appears well-developed and well-nourished. No distress.  HENT:  Head: Normocephalic and atraumatic.  Right Ear: External ear normal.  Left Ear: External ear normal.  Mouth/Throat: Oropharynx is clear and moist.  Eyes: Conjunctivae and EOM are normal. Pupils are equal, round, and reactive to light. Right eye exhibits no discharge. Left eye exhibits no discharge. No scleral icterus.  Neck: Normal range of motion. No JVD present. No tracheal deviation present. No thyromegaly present.  Cardiovascular: Normal rate, regular rhythm and normal heart sounds.  Exam reveals no friction rub.   No murmur heard. Pulmonary/Chest: Effort normal. No stridor. No respiratory distress. She has no wheezes. She has no rales. She exhibits no tenderness.  Abdominal: Soft. Bowel sounds are normal. She exhibits no distension and no mass. There is no tenderness. There is no guarding.  Musculoskeletal: She exhibits edema (BLE--trace. ). She exhibits no tenderness.  Ambulates with walker. Severe kyphosis. Trace edema BLE  Lymphadenopathy:    She has no cervical adenopathy.  Neurological: She is alert and oriented to person, place, and time. She has normal reflexes. She displays normal reflexes. No cranial nerve deficit. She exhibits normal muscle tone. Coordination normal.  Skin: Skin is warm and dry. She is not diaphoretic.  BLE pigmented change and dependent rubor seen. R+L buttock rash  Psychiatric: She has a normal mood and affect. Her behavior is normal. Judgment and thought content normal.    Filed Vitals:   10/12/14 1349  BP: 100/64  Pulse: 60  Temp: 97.5 F (36.4 C)  TempSrc: Oral  Weight: 102 lb (46.267 kg)      Labs reviewed: Basic Metabolic Panel:  Recent Labs  11/01/13 01/31/14 04/18/14  NA 134* 130* 136*  K 3.5 3.8 3.6  BUN 12 6 6   CREATININE 0.8 0.7 0.7  TSH 1.87  --   --    Liver Function Tests:  Recent Labs  11/01/13 01/31/14  AST 21 21  ALT 10 12  ALKPHOS 88 85    CBC:  Recent Labs  11/01/13 01/31/14  WBC 4.3 4.1  HGB 14.0 13.1  HCT 41 38  PLT 182 179   Past Procedures:  10/14/12 EKG normal sinus rhythm  Assessment/Plan Anxiety Takes Lorazepam 1mg  to help sleep and rest.      Constipation Stable, take Metamucil     Venous insufficiency trace pedal edema presents with Furosemide 40mg  daily. BNP56.2 07/16/12. EF 65% 10/19/12. F/u BMP Na 130, Bun/creat 9/0.7 09/2013. Pigmented and BLE and dependent rubor.      Hypothyroidism Corrected with Levothyroxine 84mcg, TSH 1.866 11/01/13. Update TSH     Hypertonicity of bladder Better symptomatic controlled with Myrbetriq 25mg  daily. 0x/night.     GERD (gastroesophageal reflux disease) Stable, continue Omeprazole 40mg  and Naproxen to prn.      Hyponatremia 11/01/13 Na 134 01/31/14 Na 130 04/18/14 Na 136 10/12/14 update CMP    Anemia associated with acute blood loss Update CBC-hx of blood loss anemia after hip surgery.      Family/ Staff Communication:  Observe the patient.   Goals of Care: AL  Labs/tests ordered: CBC, CMP, TSH

## 2014-10-19 LAB — HEPATIC FUNCTION PANEL
ALT: 10 U/L (ref 7–35)
AST: 21 U/L (ref 13–35)
Alkaline Phosphatase: 87 U/L (ref 25–125)
Bilirubin, Total: 0.7 mg/dL

## 2014-10-19 LAB — CBC AND DIFFERENTIAL
HEMATOCRIT: 41 % (ref 36–46)
Hemoglobin: 13.9 g/dL (ref 12.0–16.0)
PLATELETS: 176 10*3/uL (ref 150–399)
WBC: 4.2 10*3/mL

## 2014-10-19 LAB — BASIC METABOLIC PANEL
BUN: 8 mg/dL (ref 4–21)
CREATININE: 0.6 mg/dL (ref 0.5–1.1)
GLUCOSE: 76 mg/dL
Potassium: 4.1 mmol/L (ref 3.4–5.3)
Sodium: 137 mmol/L (ref 137–147)

## 2014-10-19 LAB — TSH: TSH: 2.22 u[IU]/mL (ref 0.41–5.90)

## 2014-10-26 ENCOUNTER — Other Ambulatory Visit: Payer: Self-pay | Admitting: Nurse Practitioner

## 2014-10-26 DIAGNOSIS — D62 Acute posthemorrhagic anemia: Secondary | ICD-10-CM

## 2014-10-26 DIAGNOSIS — E039 Hypothyroidism, unspecified: Secondary | ICD-10-CM

## 2014-10-26 DIAGNOSIS — E871 Hypo-osmolality and hyponatremia: Secondary | ICD-10-CM

## 2014-12-26 ENCOUNTER — Other Ambulatory Visit: Payer: Self-pay | Admitting: *Deleted

## 2014-12-26 MED ORDER — LORAZEPAM 1 MG PO TABS: ORAL_TABLET | ORAL | Status: DC

## 2014-12-26 NOTE — Telephone Encounter (Signed)
[facility] 

## 2015-01-11 ENCOUNTER — Encounter: Payer: Self-pay | Admitting: Internal Medicine

## 2015-01-11 ENCOUNTER — Non-Acute Institutional Stay: Payer: Medicare Other | Admitting: Internal Medicine

## 2015-01-11 VITALS — BP 100/64 | HR 68 | Temp 97.7°F | Wt 97.0 lb

## 2015-01-11 DIAGNOSIS — I739 Peripheral vascular disease, unspecified: Secondary | ICD-10-CM | POA: Diagnosis not present

## 2015-01-11 DIAGNOSIS — E039 Hypothyroidism, unspecified: Secondary | ICD-10-CM | POA: Diagnosis not present

## 2015-01-11 DIAGNOSIS — I25118 Atherosclerotic heart disease of native coronary artery with other forms of angina pectoris: Secondary | ICD-10-CM

## 2015-01-11 DIAGNOSIS — M25551 Pain in right hip: Secondary | ICD-10-CM | POA: Diagnosis not present

## 2015-01-11 DIAGNOSIS — I2510 Atherosclerotic heart disease of native coronary artery without angina pectoris: Secondary | ICD-10-CM

## 2015-01-11 DIAGNOSIS — M25559 Pain in unspecified hip: Secondary | ICD-10-CM | POA: Insufficient documentation

## 2015-01-11 DIAGNOSIS — K219 Gastro-esophageal reflux disease without esophagitis: Secondary | ICD-10-CM

## 2015-01-11 DIAGNOSIS — D62 Acute posthemorrhagic anemia: Secondary | ICD-10-CM

## 2015-01-11 DIAGNOSIS — I872 Venous insufficiency (chronic) (peripheral): Secondary | ICD-10-CM

## 2015-01-11 HISTORY — DX: Peripheral vascular disease, unspecified: I73.9

## 2015-01-11 HISTORY — DX: Atherosclerotic heart disease of native coronary artery without angina pectoris: I25.10

## 2015-01-11 MED ORDER — ISOSORBIDE MONONITRATE ER 30 MG PO TB24: ORAL_TABLET | ORAL | Status: DC

## 2015-01-11 NOTE — Progress Notes (Signed)
Patient ID: Laura Benitez, female   DOB: Aug 18, 1929, 81 y.o.   MRN: [id number]    HISTORY AND PHYSICAL  Location:  [place] Room Number: 093 Place of Service: Clinic (12)   Extended Emergency Contact Information Primary Emergency Contact: [place] Phone: [phone] Mobile Phone: [phone] Relation: Friend  Advanced Directive information    Chief Complaint  Patient presents with  . Medical Management of Chronic Issues    thyroid, anxiety, CHF, anemia    HPI:  Easily fatigued if she walks more than 100 feet. Also develops some left parasternal chest pain after 100 feet. Must rest about 5 minutes. Symptoms present over 2 years. Symptoms are not changing. No prior history of angina  Anemia associated with acute blood loss: resolved  Hip pain, right: mild  Hypothyroidism, unspecified hypothyroidism type; compensated  Gastroesophageal reflux disease without esophagitis: asymptomatic  Venous insufficiency: chronic in both legs.  PAD: both legs    Past Medical History  Diagnosis Date  . Anemia   . Depression   . Anxiety   . Fracture of lumbar spine     History in 2000  . Allergy   . Migraine headache   . GERD (gastroesophageal reflux disease)   . UTI (urinary tract infection)   . Otalgia, unspecified 10/14/2012  . Stage I pressure ulcer of back 10/14/2012  . Shortness of breath 10/14/2012  . 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 Chronic fatigue syndrome 02/09/2012  . Abnormality of gait 01/30/2012  . Senile osteoporosis 12/25/2011  . Congestive heart failure, unspecified 12/10/2011  . Acute bronchitis 12/04/2011  . Closed fracture of lateral malleolus 11/27/2011  . Edema 10/30/2011  . Peripheral vascular disease, unspecified 10/20/2011  . Urinary frequency 09/25/2011  . Throat pain 08/13/2011  . Candidiasis of other urogenital sites 07/21/2011  . Unspecified hypothyroidism 07/21/2011  . Personality change due  to conditions classified elsewhere 07/21/2011  . Cracked tooth 07/21/2011  . Candidiasis of mouth 07/16/2011  . Neoplasm of uncertain behavior of skin 07/16/2011  . Lumbago 07/16/2011  . Dysphagia 07/16/2011  . Hyposmolality and/or hyponatremia 07/15/2011  . Acute posthemorrhagic anemia 07/15/2011  . Major depressive disorder, single episode, unspecified 07/15/2011  . Unspecified constipation 07/15/2011  . Pain in joint, pelvic region and thigh 07/15/2011  . Other closed fractures of distal end of radius (alone) 07/15/2011  . Closed fracture of distal end of ulna (alone) 07/15/2011  . Other orthopedic aftercare(V54.89) 07/15/2011    Past Surgical History  Procedure Laterality Date  . Tonsillectomy      @age  4  . Total abdominal hysterectomy    . Hip arthroplasty  07/10/2011    Procedure: ARTHROPLASTY BIPOLAR HIP;  Surgeon: Rozanna Box, MD;  Location: [facility];  Service: Orthopedics;  Laterality: Right;  . Orif wrist fracture  07/10/2011    Procedure: OPEN REDUCTION INTERNAL FIXATION (ORIF) WRIST FRACTURE;  Surgeon: Rozanna Box, MD;  Location: [facility];  Service: Orthopedics;  Laterality: Right;    Patient Care Team: [name], MD as PCP - General (Internal Medicine)  History   Social History  . Marital Status: Single    Spouse Name: N/A  . Number of Children: 0  . Years of Education: N/A   Occupational History  . retired Pharmacist, hospital    Social History Main Topics  . Smoking status: Never Smoker   . Smokeless tobacco: Never Used  . Alcohol Use: No  . Drug Use: No  .  Sexual Activity: Not Currently   Other Topics Concern  . Not on file   Social History Narrative   L[facility] at [facility]     reports that she has never smoked. She has never used smokeless tobacco. She reports that she does not drink alcohol or use illicit drugs.  Family History  Problem Relation Age of Onset  . Lung cancer Father   . Colon cancer Neg Hx    Family Status  Relation  Status Death Age  . Mother Deceased   . Father Deceased     Immunization History  Administered Date(s) Administered  . Influenza-Unspecified 06/23/2013, 06/22/2014  . Pneumococcal Polysaccharide-23 09/08/2013    Allergies  Allergen Reactions  . Milk-Related Compounds Other (See Comments)    Milk products cause patient to have migraine headaches.  Pt can tolerate butter.    Medications: Patient's Medications  New Prescriptions   No medications on file  Previous Medications   ACTONEL 150 MG TABLET    Take 150 mg by mouth every 30 (thirty) days. Take  1 tablet once a month on the 18th of the month.   ALUM & MAG HYDROXIDE-SIMETH (MINTOX) 939-030-09 MG/5ML SUSPENSION    Take 30 mLs by mouth 3 (three) times daily. Take 30 ml three times daily as needed for indigestion.   FLUTICASONE (FLONASE) 50 MCG/ACT NASAL SPRAY    Place 2 sprays into the nose daily. Daily   FUROSEMIDE (LASIX) 40 MG TABLET    Take 40 mg by mouth daily. Take one daily   LEVOTHYROXINE (SYNTHROID, LEVOTHROID) 50 MCG TABLET    Take 50 mcg by mouth daily. Take 1 tablet by mouth daily.   LORAZEPAM (ATIVAN) 1 MG TABLET    Take one tablet by mouth at bedtime for insomnia   MYRBETRIQ 25 MG TB24    Take 25 mg by mouth daily. Take one tablet daily   OMEPRAZOLE (PRILOSEC) 20 MG CAPSULE    Take one daily   OYSTER SHELL 500 MG TABS    Take 2 tablets by mouth. Take 1 tablet by mouth twice daily.   POLYETHYL GLYCOL-PROPYL GLYCOL (SYSTANE) 0.4-0.3 % SOLN    Apply 1 drop to eye. Both eyes as needed for comfort   PSYLLIUM (METAMUCIL) 58.6 % POWDER    Take 1 packet by mouth. Take teaspoonful mixed in 8oz fluide daily as needed   SIMETHICONE (MYLICON) 233 MG CHEWABLE TABLET    Chew 125 mg by mouth. Take one three  times daily before meals   SODIUM CHLORIDE (DEEP SEA NASAL SPRAY) 0.65 % NASAL SPRAY    Place 1 spray into the nose. Use 1 spray in each nostril as needed   VITAMIN A 00762 UNIT CAPSULE    Take 10,000 Units by mouth daily.    Modified Medications   No medications on file  Discontinued Medications   NAPROXEN SODIUM (ALEVE) 220 MG CAPS    Take by mouth as needed. Take 1 tablet by mouth twice daily.    Review of Systems  Constitutional: Positive for fatigue. Negative for fever, chills and diaphoresis.  HENT: Positive for hearing loss. Negative for congestion, ear discharge and sore throat.   Eyes: Negative for photophobia, pain, discharge and redness.  Respiratory: Positive for chest tightness and shortness of breath (exertional. ). Negative for cough and wheezing.   Cardiovascular: Positive for leg swelling. Negative for chest pain and palpitations.       Trace edema BLE  Gastrointestinal: Negative for nausea, vomiting, abdominal  pain, diarrhea and constipation.       Hx GERD.  Genitourinary: Negative for dysuria, urgency, frequency, hematuria and flank pain.       Sleeps through night since Myrbetriq  Musculoskeletal: Negative for myalgias, back pain and neck pain.  Skin: Positive for rash.       Pigmented BLE.   Allergic/Immunologic: Negative for environmental allergies.  Neurological: Negative for dizziness, tremors, seizures, weakness and headaches.       MEMORY LOSS  Hematological: Does not bruise/bleed easily.  Psychiatric/Behavioral: Positive for confusion. Negative for hallucinations. The patient is not nervous/anxious.     Filed Vitals:   01/11/15 0955  BP: 100/64  Pulse: 68  Temp: 97.7 F (36.5 C)  TempSrc: Oral  Weight: 97 lb (43.999 kg)  SpO2: 94%   Body mass index is 18.34 kg/(m^2).  Physical Exam  Constitutional: She is oriented to person, place, and time. She appears well-developed and well-nourished. No distress.  HENT:  Head: Normocephalic and atraumatic.  Right Ear: External ear normal.  Left Ear: External ear normal.  Mouth/Throat: Oropharynx is clear and moist.  Eyes: Conjunctivae and EOM are normal. Pupils are equal, round, and reactive to light. Right eye exhibits no  discharge. Left eye exhibits no discharge. No scleral icterus.  Neck: Normal range of motion. No JVD present. No tracheal deviation present. No thyromegaly present.  Cardiovascular: Normal rate, regular rhythm and normal heart sounds.  Exam reveals no friction rub.   No murmur heard. Absent DP and PT bilaterally. Chronic venous insufficiency.  Pulmonary/Chest: Effort normal. No stridor. No respiratory distress. She has no wheezes. She has no rales. She exhibits no tenderness.  Abdominal: Soft. Bowel sounds are normal. She exhibits no distension and no mass. There is no tenderness. There is no guarding.  Musculoskeletal: She exhibits edema (BLE--trace. ) and tenderness (lower legs bilaterally).  Ambulates with walker. Severe kyphosis. Trace edema BLE. Dependent rubor.  Lymphadenopathy:    She has no cervical adenopathy.  Neurological: She is alert and oriented to person, place, and time. She has normal reflexes. She displays normal reflexes. No cranial nerve deficit. She exhibits normal muscle tone. Coordination normal.  Skin: Skin is warm and dry. She is not diaphoretic.  BLE pigmented change and dependent rubor seen. R+L buttock rash  Psychiatric: She has a normal mood and affect. Her behavior is normal. Judgment and thought content normal.     Labs reviewed: Lab on 10/26/2014  Component Date Value Ref Range Status  . Hemoglobin 10/19/2014 13.9  12.0 - 16.0 g/dL Final  . HCT 10/19/2014 41  36 - 46 % Final  . Platelets 10/19/2014 176  150 - 399 K/L Final  . WBC 10/19/2014 4.2   Final  . Glucose 10/19/2014 76   Final  . BUN 10/19/2014 8  4 - 21 mg/dL Final  . Creatinine 10/19/2014 0.6  0.5 - 1.1 mg/dL Final  . Potassium 10/19/2014 4.1  3.4 - 5.3 mmol/L Final  . Sodium 10/19/2014 137  137 - 147 mmol/L Final  . Alkaline Phosphatase 10/19/2014 87  25 - 125 U/L Final  . ALT 10/19/2014 10  7 - 35 U/L Final  . AST 10/19/2014 21  13 - 35 U/L Final  . Bilirubin, Total 10/19/2014 0.7   Final    . TSH 10/19/2014 2.22  0.41 - 5.90 uIU/mL Final    No results found.   Assessment/Plan  1. Anemia associated with acute blood loss resolved  2. Hip pain, right Mild residual  pain from prior femoral fx in 2012  3. Hypothyroidism, unspecified hypothyroidism type compensated  4. Gastroesophageal reflux disease without esophagitis sasymptomatic  5. Coronary artery disease involving native coronary artery of native heart with other form of angina pectoris - isosorbide mononitrate (IMDUR) 30 MG 24 hr tablet; One daily to help prevent angina  Dispense: 30 tablet; Refill: 6  6. Venous insufficiency observe  7. PAD (peripheral artery disease) observe

## 2015-01-16 NOTE — Addendum Note (Signed)
Addended by: [name] on: 01/16/2015 11:21 AM   Modules accepted: Level of Service

## 2015-01-25 ENCOUNTER — Encounter: Payer: Self-pay | Admitting: Internal Medicine

## 2015-01-25 ENCOUNTER — Non-Acute Institutional Stay: Payer: Medicare Other | Admitting: Internal Medicine

## 2015-01-25 VITALS — BP 100/62 | HR 72 | Temp 97.4°F | Wt 94.0 lb

## 2015-01-25 DIAGNOSIS — I25118 Atherosclerotic heart disease of native coronary artery with other forms of angina pectoris: Secondary | ICD-10-CM

## 2015-01-25 DIAGNOSIS — F329 Major depressive disorder, single episode, unspecified: Secondary | ICD-10-CM

## 2015-01-25 DIAGNOSIS — R5381 Other malaise: Secondary | ICD-10-CM

## 2015-01-25 DIAGNOSIS — F32A Depression, unspecified: Secondary | ICD-10-CM

## 2015-01-25 MED ORDER — SERTRALINE HCL 50 MG PO TABS: ORAL_TABLET | ORAL | Status: DC

## 2015-01-25 NOTE — Progress Notes (Signed)
Patient ID: Laura Benitez, female   DOB: 05-24-1929, 81 y.o.   MRN: [id number]    [place] Room Number: [phone]  Place of Service: Clinic (12)     Allergies  Allergen Reactions  . Milk-Related Compounds Other (See Comments)    Milk products cause patient to have migraine headaches.  Pt can tolerate butter.    Chief Complaint  Patient presents with  . Medical Management of Chronic Issues    2 week follow up on Coronary artery disease after starting Imdur    HPI:  At the time of her visit 2 weeks ago patient was having increased fatigue which seem to get worse as she walked. Additionally there was some tightness in the chest. Isosorbide mononitrate was added, but she has not experienced any improvement in her symptoms. She denies any substernal chest pain or palpitations. Electrocardiogram today showed a sinus bradycardia with a rate of 59. There was no evidence of cardiac ischemia.  She is not sleeping well and is frequently up and down through the night.  Patient admits to history of depression although she says that she does not feel particularly depressed at this time.  Medications: Patient's Medications  New Prescriptions   No medications on file  Previous Medications   ACTONEL 150 MG TABLET    Take 150 mg by mouth every 30 (thirty) days. Take  1 tablet once a month on the 18th of the month.   ALUM & MAG HYDROXIDE-SIMETH (MINTOX) 244-010-27 MG/5ML SUSPENSION    Take 30 mLs by mouth 3 (three) times daily. Take 30 ml three times daily as needed for indigestion.   FLUTICASONE (FLONASE) 50 MCG/ACT NASAL SPRAY    Place 2 sprays into the nose daily. Daily   FUROSEMIDE (LASIX) 40 MG TABLET    Take 40 mg by mouth daily. Take one daily   ISOSORBIDE MONONITRATE (IMDUR) 30 MG 24 HR TABLET    One daily to help prevent angina   LEVOTHYROXINE (SYNTHROID, LEVOTHROID) 50 MCG TABLET    Take 50 mcg by mouth daily. Take 1 tablet by mouth daily.   LORAZEPAM  (ATIVAN) 1 MG TABLET    Take one tablet by mouth at bedtime for insomnia   MYRBETRIQ 25 MG TB24    Take 25 mg by mouth daily. Take one tablet daily   OMEPRAZOLE (PRILOSEC) 20 MG CAPSULE    Take one daily   OYSTER SHELL 500 MG TABS    Take 2 tablets by mouth. Take 1 tablet by mouth twice daily.   POLYETHYL GLYCOL-PROPYL GLYCOL (SYSTANE) 0.4-0.3 % SOLN    Apply 1 drop to eye. Both eyes as needed for comfort   PSYLLIUM (METAMUCIL) 58.6 % POWDER    Take 1 packet by mouth. Take teaspoonful mixed in 8oz fluide daily as needed   SIMETHICONE (MYLICON) 253 MG CHEWABLE TABLET    Chew 125 mg by mouth. Take one three  times daily before meals   SODIUM CHLORIDE (DEEP SEA NASAL SPRAY) 0.65 % NASAL SPRAY    Place 1 spray into the nose. Use 1 spray in each nostril as needed   VITAMIN A 66440 UNIT CAPSULE    Take 10,000 Units by mouth daily.  Modified Medications   No medications on file  Discontinued Medications   No medications on file     Review of Systems  Constitutional: Positive for fatigue. Negative for fever, chills and diaphoresis.  HENT: Positive for hearing loss. Negative for congestion, ear  discharge and sore throat.   Eyes: Negative for photophobia, pain, discharge and redness.  Respiratory: Positive for chest tightness and shortness of breath (exertional. ). Negative for cough and wheezing.   Cardiovascular: Positive for leg swelling. Negative for chest pain and palpitations.       Trace edema BLE  Gastrointestinal: Negative for nausea, vomiting, abdominal pain, diarrhea and constipation.       Hx GERD.  Genitourinary: Negative for dysuria, urgency, frequency, hematuria and flank pain.       Sleeps through night since Myrbetriq  Musculoskeletal: Negative for myalgias, back pain and neck pain.  Skin: Positive for rash.       Pigmented BLE.   Allergic/Immunologic: Negative for environmental allergies.  Neurological: Negative for dizziness, tremors, seizures, weakness and headaches.        MEMORY LOSS  Hematological: Does not bruise/bleed easily.  Psychiatric/Behavioral: Positive for confusion. Negative for hallucinations. The patient is not nervous/anxious.     Filed Vitals:   01/25/15 1402  BP: 100/62  Pulse: 72  Temp: 97.4 F (36.3 C)  TempSrc: Oral  Weight: 94 lb (42.638 kg)  SpO2: 91%   Body mass index is 17.77 kg/(m^2).  Physical Exam  Constitutional: She is oriented to person, place, and time. She appears well-developed and well-nourished. No distress.  HENT:  Head: Normocephalic and atraumatic.  Right Ear: External ear normal.  Left Ear: External ear normal.  Mouth/Throat: Oropharynx is clear and moist.  Eyes: Conjunctivae and EOM are normal. Pupils are equal, round, and reactive to light. Right eye exhibits no discharge. Left eye exhibits no discharge. No scleral icterus.  Neck: Normal range of motion. No JVD present. No tracheal deviation present. No thyromegaly present.  Cardiovascular: Normal rate, regular rhythm and normal heart sounds.  Exam reveals no friction rub.   No murmur heard. Absent DP and PT bilaterally. Chronic venous insufficiency.  Pulmonary/Chest: Effort normal. No stridor. No respiratory distress. She has no wheezes. She has no rales. She exhibits no tenderness.  Abdominal: Soft. Bowel sounds are normal. She exhibits no distension and no mass. There is no tenderness. There is no guarding.  Musculoskeletal: She exhibits edema (BLE--trace. ) and tenderness (lower legs bilaterally).  Ambulates with walker. Severe kyphosis. Trace edema BLE. Dependent rubor.  Lymphadenopathy:    She has no cervical adenopathy.  Neurological: She is alert and oriented to person, place, and time. She has normal reflexes. She displays normal reflexes. No cranial nerve deficit. She exhibits normal muscle tone. Coordination normal.  Skin: Skin is warm and dry. She is not diaphoretic.  BLE pigmented change and dependent rubor seen. R+L buttock rash  Psychiatric:  She has a normal mood and affect. Her behavior is normal. Judgment and thought content normal.     Labs reviewed: Lab on 10/26/2014  Component Date Value Ref Range Status  . Hemoglobin 10/19/2014 13.9  12.0 - 16.0 g/dL Final  . HCT 10/19/2014 41  36 - 46 % Final  . Platelets 10/19/2014 176  150 - 399 K/L Final  . WBC 10/19/2014 4.2   Final  . Glucose 10/19/2014 76   Final  . BUN 10/19/2014 8  4 - 21 mg/dL Final  . Creatinine 10/19/2014 0.6  0.5 - 1.1 mg/dL Final  . Potassium 10/19/2014 4.1  3.4 - 5.3 mmol/L Final  . Sodium 10/19/2014 137  137 - 147 mmol/L Final  . Alkaline Phosphatase 10/19/2014 87  25 - 125 U/L Final  . ALT 10/19/2014 10  7 -  35 U/L Final  . AST 10/19/2014 21  13 - 35 U/L Final  . Bilirubin, Total 10/19/2014 0.7   Final  . TSH 10/19/2014 2.22  0.41 - 5.90 uIU/mL Final     Assessment/Plan  1. Coronary artery disease involving native coronary artery of native heart with other form of angina pectoris Discontinued isosorbide mononitrate since there was no improvement while taking this medication  2. Malaise Possibly associated with depression  3. Depression Added sertraline 50 mg each day

## 2015-03-08 ENCOUNTER — Non-Acute Institutional Stay: Payer: Medicare Other | Admitting: Internal Medicine

## 2015-03-08 ENCOUNTER — Encounter: Payer: Self-pay | Admitting: Internal Medicine

## 2015-03-08 VITALS — BP 98/58 | HR 68 | Temp 97.4°F | Wt 94.0 lb

## 2015-03-08 DIAGNOSIS — R5381 Other malaise: Secondary | ICD-10-CM | POA: Diagnosis not present

## 2015-03-08 DIAGNOSIS — F32A Depression, unspecified: Secondary | ICD-10-CM

## 2015-03-08 DIAGNOSIS — F329 Major depressive disorder, single episode, unspecified: Secondary | ICD-10-CM

## 2015-03-08 NOTE — Progress Notes (Signed)
atient ID: Laura Benitez, female   DOB: June 18, 1929, 81 y.o.   MRN: [id number]    [place] Room Number: [phone]  Place of Service: Clinic (12)     Allergies  Allergen Reactions  . Milk-Related Compounds Other (See Comments)    Milk products cause patient to have migraine headaches.  Pt can tolerate butter.    Chief Complaint  Patient presents with  . Medical Management of Chronic Issues    6 week follow-up coronary artery disease, depression  . Fatigue    increase per nurse    HPI:  No improvement in the fatigue symptoms. Denies chest pain. Isosorbide has not been helpful. It was discontinued 01/25/2015.  Speculated the depression was creating part of the problems with fatigue. Sertraline was added at 50 mg daily on 01/25/2015. Patient says she is not feeling much better since this drug was started. She has had no adverse reaction to it.  Medications: Patient's Medications  New Prescriptions   No medications on file  Previous Medications   ACTONEL 150 MG TABLET    Take 150 mg by mouth every 30 (thirty) days. Take  1 tablet once a month on the 18th of the month.   ALUM & MAG HYDROXIDE-SIMETH (MINTOX) 053-976-73 MG/5ML SUSPENSION    Take 30 mLs by mouth 3 (three) times daily. Take 30 ml three times daily as needed for indigestion.   FLUTICASONE (FLONASE) 50 MCG/ACT NASAL SPRAY    Place 2 sprays into the nose daily. Daily   FUROSEMIDE (LASIX) 40 MG TABLET    Take 40 mg by mouth daily. Take one daily   LEVOTHYROXINE (SYNTHROID, LEVOTHROID) 50 MCG TABLET    Take 50 mcg by mouth daily. Take 1 tablet by mouth daily.   LORAZEPAM (ATIVAN) 1 MG TABLET    Take one tablet by mouth at bedtime for insomnia   MYRBETRIQ 25 MG TB24    Take 25 mg by mouth daily. Take one tablet daily   OMEPRAZOLE (PRILOSEC) 20 MG CAPSULE    Take one daily   OYSTER SHELL 500 MG TABS    Take 2 tablets by mouth. Take 1 tablet by mouth twice daily.   POLYETHYL GLYCOL-PROPYL GLYCOL  (SYSTANE) 0.4-0.3 % SOLN    Apply 1 drop to eye. Both eyes as needed for comfort   PSYLLIUM (METAMUCIL) 58.6 % POWDER    Take 1 packet by mouth. Take teaspoonful mixed in 8oz fluide daily as needed   SERTRALINE (ZOLOFT) 50 MG TABLET    One daily to help depression   SIMETHICONE (MYLICON) 419 MG CHEWABLE TABLET    Chew 125 mg by mouth. Take one three  times daily before meals   SODIUM CHLORIDE (DEEP SEA NASAL SPRAY) 0.65 % NASAL SPRAY    Place 1 spray into the nose. Use 1 spray in each nostril as needed   VITAMIN A 37902 UNIT CAPSULE    Take 10,000 Units by mouth daily.  Modified Medications   No medications on file  Discontinued Medications   No medications on file     Review of Systems  Constitutional: Positive for fatigue. Negative for fever, chills and diaphoresis.  HENT: Positive for hearing loss. Negative for congestion, ear discharge and sore throat.   Eyes: Negative for photophobia, pain, discharge and redness.  Respiratory: Positive for chest tightness and shortness of breath (exertional. ). Negative for cough and wheezing.   Cardiovascular: Positive for leg swelling. Negative for chest pain and palpitations.  Trace edema BLE  Gastrointestinal: Negative for nausea, vomiting, abdominal pain, diarrhea and constipation.       Hx GERD.  Genitourinary: Negative for dysuria, urgency, frequency, hematuria and flank pain.       Sleeps through night since Myrbetriq  Musculoskeletal: Negative for myalgias, back pain and neck pain.  Skin: Positive for rash.       Pigmented BLE.   Allergic/Immunologic: Negative for environmental allergies.  Neurological: Negative for dizziness, tremors, seizures, weakness and headaches.       MEMORY LOSS  Hematological: Does not bruise/bleed easily.  Psychiatric/Behavioral: Positive for confusion. Negative for hallucinations. The patient is not nervous/anxious.     Filed Vitals:   03/08/15 1702  BP: 98/58  Pulse: 68  Temp: 97.4 F (36.3 C)    TempSrc: Oral  Weight: 94 lb (42.638 kg)  SpO2: 92%   Body mass index is 17.77 kg/(m^2).  Physical Exam  Constitutional: She is oriented to person, place, and time. She appears well-developed and well-nourished. No distress.  HENT:  Head: Normocephalic and atraumatic.  Right Ear: External ear normal.  Left Ear: External ear normal.  Mouth/Throat: Oropharynx is clear and moist.  Eyes: Conjunctivae and EOM are normal. Pupils are equal, round, and reactive to light. Right eye exhibits no discharge. Left eye exhibits no discharge. No scleral icterus.  Neck: Normal range of motion. No JVD present. No tracheal deviation present. No thyromegaly present.  Cardiovascular: Normal rate, regular rhythm and normal heart sounds.  Exam reveals no friction rub.   No murmur heard. Absent DP and PT bilaterally. Chronic venous insufficiency.  Pulmonary/Chest: Effort normal. No stridor. No respiratory distress. She has no wheezes. She has no rales. She exhibits no tenderness.  Abdominal: Soft. Bowel sounds are normal. She exhibits no distension and no mass. There is no tenderness. There is no guarding.  Musculoskeletal: She exhibits edema (BLE--trace. ) and tenderness (lower legs bilaterally).  Ambulates with walker. Severe kyphosis. Trace edema BLE. Dependent rubor.  Lymphadenopathy:    She has no cervical adenopathy.  Neurological: She is alert and oriented to person, place, and time. She has normal reflexes. She displays normal reflexes. No cranial nerve deficit. She exhibits normal muscle tone. Coordination normal.  Skin: Skin is warm and dry. She is not diaphoretic.  BLE pigmented change and dependent rubor seen. R+L buttock rash  Psychiatric: She has a normal mood and affect. Her behavior is normal. Judgment and thought content normal.     Labs reviewed: No visits with results within 3 Month(s) from this visit. Latest known visit with results is:  Lab on 10/26/2014  Component Date Value Ref  Range Status  . Hemoglobin 10/19/2014 13.9  12.0 - 16.0 g/dL Final  . HCT 10/19/2014 41  36 - 46 % Final  . Platelets 10/19/2014 176  150 - 399 K/L Final  . WBC 10/19/2014 4.2   Final  . Glucose 10/19/2014 76   Final  . BUN 10/19/2014 8  4 - 21 mg/dL Final  . Creatinine 10/19/2014 0.6  0.5 - 1.1 mg/dL Final  . Potassium 10/19/2014 4.1  3.4 - 5.3 mmol/L Final  . Sodium 10/19/2014 137  137 - 147 mmol/L Final  . Alkaline Phosphatase 10/19/2014 87  25 - 125 U/L Final  . ALT 10/19/2014 10  7 - 35 U/L Final  . AST 10/19/2014 21  13 - 35 U/L Final  . Bilirubin, Total 10/19/2014 0.7   Final  . TSH 10/19/2014 2.22  0.41 - 5.90  uIU/mL Final     Assessment/Plan  1. Malaise persistent  2. Depression -increase sertraline to 100mg  daily

## 2015-03-13 LAB — HEPATIC FUNCTION PANEL
ALT: 9 U/L (ref 7–35)
AST: 16 U/L (ref 13–35)
Alkaline Phosphatase: 69 U/L (ref 25–125)
BILIRUBIN, TOTAL: 0.6 mg/dL

## 2015-03-13 LAB — BASIC METABOLIC PANEL
BUN: 8 mg/dL (ref 4–21)
Creatinine: 0.6 mg/dL (ref 0.5–1.1)
GLUCOSE: 83 mg/dL
POTASSIUM: 3.8 mmol/L (ref 3.4–5.3)
Sodium: 135 mmol/L — AB (ref 137–147)

## 2015-03-13 MED ORDER — SERTRALINE HCL 100 MG PO TABS: ORAL_TABLET | ORAL | Status: AC

## 2015-04-19 ENCOUNTER — Encounter: Payer: Medicare Other | Admitting: Cardiovascular Disease

## 2015-04-19 NOTE — Progress Notes (Signed)
Patient ID: Laura Benitez, female   DOB: [birth year], 81 y.o.   MRN: [id number] 81 y.o. referred from [facility] for ? Circulation problems.  Last seen over 3 years ago  She has had chronic discoloration of LE;s  Detert pigmentation and bluish discoloration in dependancy.  Previous femoral neck fracture fixed by Dr Marcelino Scot.  No ulcers, no pain in feet and no claudication symptoms.  Ambulates with walker.  No history of vascular disease, CAD.  Nonsmoker.  Recent visit to primary for constipation.  She appears to have some dementia as her memory is quite poor.  Denies history of DVT  More depression malaise and fatigue  On escalating doses of sertraline per Dr Nyoka Cowden  ROS: Denies fever, malais, weight loss, blurry vision, decreased visual acuity, cough, sputum, SOB, hemoptysis, pleuritic pain, palpitaitons, heartburn, abdominal pain, melena, lower extremity edema, claudication, or rash.  All other systems reviewed and negative   General: Affect appropriate Frail elderly female HEENT: normal Neck supple with no adenopathy JVP normal no bruits no thyromegaly Lungs clear with no wheezing and good diaphragmatic motion Heart:  S1/S2 no murmur,rub, gallop or click PMI normal Abdomen: benighn, BS positve, no tenderness, no AAA no bruit.  No HSM or HJR Distal pulses intact with no bruits Trace edema venous insuf. Chronic with bilateral dependant rubor disappears with leg elevation Neuro non-focal Skin warm and dry No muscular weakness  Medications Current Outpatient Prescriptions  Medication Sig Dispense Refill  . ACTONEL 150 MG tablet Take 150 mg by mouth every 30 (thirty) days. Take  1 tablet once a month on the 18th of the month.    Marland Kitchen alum & mag hydroxide-simeth (MINTOX) [phone] MG/5ML suspension Take 30 mLs by mouth 3 (three) times daily. Take 30 ml three times daily as needed for indigestion.    . fluticasone (FLONASE) 50 MCG/ACT nasal spray Place 2 sprays into the nose daily. Daily    .  furosemide (LASIX) 40 MG tablet Take 40 mg by mouth daily. Take one daily    . levothyroxine (SYNTHROID, LEVOTHROID) 50 MCG tablet Take 50 mcg by mouth daily. Take 1 tablet by mouth daily.    Marland Kitchen LORazepam (ATIVAN) 1 MG tablet Take one tablet by mouth at bedtime for insomnia 30 tablet 5  . MYRBETRIQ 25 MG TB24 Take 25 mg by mouth daily. Take one tablet daily    . omeprazole (PRILOSEC) 20 MG capsule Take one daily    . Oyster Shell 500 MG TABS Take 2 tablets by mouth. Take 1 tablet by mouth twice daily.    Vladimir Faster Glycol-Propyl Glycol (SYSTANE) 0.4-0.3 % SOLN Apply 1 drop to eye. Both eyes as needed for comfort    . psyllium (METAMUCIL) 58.6 % powder Take 1 packet by mouth. Take teaspoonful mixed in 8oz fluide daily as needed    . sertraline (ZOLOFT) 100 MG tablet One daily to help depression and anxiety 30 tablet 3  . simethicone (MYLICON) 599 MG chewable tablet Chew 125 mg by mouth. Take one three  times daily before meals    . sodium chloride (DEEP SEA NASAL SPRAY) 0.65 % nasal spray Place 1 spray into the nose. Use 1 spray in each nostril as needed    . vitamin A 10000 UNIT capsule Take 10,000 Units by mouth daily.     No current facility-administered medications for this visit.    Allergies Milk-related compounds  Family History: Family History  Problem Relation Age of Onset  . Lung cancer Father   .  Colon cancer Neg Hx     Social History: Social History   Social History  . Marital Status: Single    Spouse Name: N/A  . Number of Children: 0  . Years of Education: N/A   Occupational History  . retired Pharmacist, hospital    Social History Main Topics  . Smoking status: Never Smoker   . Smokeless tobacco: Never Used  . Alcohol Use: No  . Drug Use: No  . Sexual Activity: Not Currently   Other Topics Concern  . Not on file   Social History Narrative   Lives at [facility]    Electrocardiogram:  11/12  NSR rate 83 normal ECG  Assessment and Plan Venous  Insufficiency Depression: Thyroid

## 2015-05-03 ENCOUNTER — Encounter: Payer: Self-pay | Admitting: Internal Medicine

## 2015-05-03 ENCOUNTER — Non-Acute Institutional Stay: Payer: Medicare Other | Admitting: Internal Medicine

## 2015-05-03 VITALS — BP 100/64 | HR 80 | Temp 97.8°F | Wt 83.0 lb

## 2015-05-03 DIAGNOSIS — F32A Depression, unspecified: Secondary | ICD-10-CM

## 2015-05-03 DIAGNOSIS — G309 Alzheimer's disease, unspecified: Secondary | ICD-10-CM

## 2015-05-03 DIAGNOSIS — F329 Major depressive disorder, single episode, unspecified: Secondary | ICD-10-CM | POA: Diagnosis not present

## 2015-05-03 DIAGNOSIS — R5381 Other malaise: Secondary | ICD-10-CM

## 2015-05-03 DIAGNOSIS — F0280 Dementia in other diseases classified elsewhere without behavioral disturbance: Secondary | ICD-10-CM

## 2015-05-03 HISTORY — DX: Dementia in other diseases classified elsewhere, unspecified severity, without behavioral disturbance, psychotic disturbance, mood disturbance, and anxiety: F02.80

## 2015-05-03 MED ORDER — DONEPEZIL HCL 5 MG PO TABS: ORAL_TABLET | ORAL | Status: AC

## 2015-05-03 NOTE — Progress Notes (Signed)
Patient ID: Laura Benitez, female   DOB: [birth year], 81 y.o.   MRN: [id number]    [place] Room Number: 784  Place of Service: Clinic (12)     Allergies  Allergen Reactions  . Milk-Related Compounds Other (See Comments)    Milk products cause patient to have migraine headaches.  Pt can tolerate butter.    Chief Complaint  Patient presents with  . Medical Management of Chronic Issues    HPI:  Malaise - generalized sense of lethargy and loss of energy. No fever. Just doesn't feel well. Spends much of her time in her room. I previously thought that this could be related to depression, but she has not responded well to sertraline. Today's Mini-Mental status exam score 17/30 and her symptoms of generalized malaise and fatigue could certainly be related to a failing memory and Alzheimer's.  Depression - poor response to sertraline. She is able to laugh a couple of times during the exam. Responses are really quite sluggish. She is stable longer and isolates herself in her room much of the time.  Alzheimer's disease - most likely cause of the diminished cognitive status    Medications: Patient's Medications  New Prescriptions   No medications on file  Previous Medications   ACTONEL 150 MG TABLET    Take 150 mg by mouth every 30 (thirty) days. Take  1 tablet once a month on the 18th of the month.   ALUM & MAG HYDROXIDE-SIMETH (MINTOX) [phone] MG/5ML SUSPENSION    Take 30 mLs by mouth 3 (three) times daily. Take 30 ml three times daily as needed for indigestion.   FLUTICASONE (FLONASE) 50 MCG/ACT NASAL SPRAY    Place 2 sprays into the nose daily. Daily   FUROSEMIDE (LASIX) 40 MG TABLET    Take 40 mg by mouth daily. Take one daily   LEVOTHYROXINE (SYNTHROID, LEVOTHROID) 50 MCG TABLET    Take 50 mcg by mouth daily. Take 1 tablet by mouth daily.   LORAZEPAM (ATIVAN) 1 MG TABLET    Take one tablet by mouth at bedtime for insomnia   MYRBETRIQ 25 MG TB24     Take 25 mg by mouth daily. Take one tablet daily   OMEPRAZOLE (PRILOSEC) 20 MG CAPSULE    Take one daily   OYSTER SHELL 500 MG TABS    Take 2 tablets by mouth. Take 1 tablet by mouth twice daily.   POLYETHYL GLYCOL-PROPYL GLYCOL (SYSTANE) 0.4-0.3 % SOLN    Apply 1 drop to eye. Both eyes as needed for comfort   PSYLLIUM (METAMUCIL) 58.6 % POWDER    Take 1 packet by mouth. Take teaspoonful mixed in 8oz fluide daily as needed   SERTRALINE (ZOLOFT) 100 MG TABLET    One daily to help depression and anxiety   SODIUM CHLORIDE (DEEP SEA NASAL SPRAY) 0.65 % NASAL SPRAY    Place 1 spray into the nose. Use 1 spray in each nostril as needed   VITAMIN A 41324 UNIT CAPSULE    Take 10,000 Units by mouth daily.  Modified Medications   No medications on file  Discontinued Medications   SIMETHICONE (MYLICON) 401 MG CHEWABLE TABLET    Chew 125 mg by mouth. Take one three  times daily before meals     Review of Systems  Constitutional: Positive for fatigue. Negative for fever, chills and diaphoresis.  HENT: Positive for hearing loss. Negative for congestion, ear discharge and sore throat.   Eyes: Negative for  photophobia, pain, discharge and redness.  Respiratory: Positive for chest tightness and shortness of breath (exertional. ). Negative for cough and wheezing.   Cardiovascular: Positive for leg swelling. Negative for chest pain and palpitations.       Trace edema BLE  Gastrointestinal: Negative for nausea, vomiting, abdominal pain, diarrhea and constipation.       Hx GERD.  Genitourinary: Negative for dysuria, urgency, frequency, hematuria and flank pain.       Sleeps through night since Myrbetriq  Musculoskeletal: Negative for myalgias, back pain and neck pain.  Skin: Positive for rash.       Pigmented BLE.   Allergic/Immunologic: Negative for environmental allergies.  Neurological: Negative for dizziness, tremors, seizures, weakness and headaches.       MEMORY LOSS  Hematological: Does not  bruise/bleed easily.  Psychiatric/Behavioral: Positive for confusion. Negative for hallucinations. The patient is not nervous/anxious.     Filed Vitals:   05/03/15 1641  BP: 100/64  Pulse: 80  Temp: 97.8 F (36.6 C)  TempSrc: Oral  Weight: 83 lb (37.649 kg)  SpO2: 95%   Body mass index is 15.69 kg/(m^2).  Physical Exam  Constitutional: She is oriented to person, place, and time. She appears well-developed and well-nourished. No distress.  HENT:  Head: Normocephalic and atraumatic.  Right Ear: External ear normal.  Left Ear: External ear normal.  Mouth/Throat: Oropharynx is clear and moist.  Eyes: Conjunctivae and EOM are normal. Pupils are equal, round, and reactive to light. Right eye exhibits no discharge. Left eye exhibits no discharge. No scleral icterus.  Neck: Normal range of motion. No JVD present. No tracheal deviation present. No thyromegaly present.  Cardiovascular: Normal rate, regular rhythm and normal heart sounds.  Exam reveals no friction rub.   No murmur heard. Absent DP and PT bilaterally. Chronic venous insufficiency.  Pulmonary/Chest: Effort normal. No stridor. No respiratory distress. She has no wheezes. She has no rales. She exhibits no tenderness.  Abdominal: Soft. Bowel sounds are normal. She exhibits no distension and no mass. There is no tenderness. There is no guarding.  Musculoskeletal: She exhibits edema (BLE--trace. ) and tenderness (lower legs bilaterally).  Ambulates with walker. Severe kyphosis. Trace edema BLE. Dependent rubor.  Lymphadenopathy:    She has no cervical adenopathy.  Neurological: She is alert and oriented to person, place, and time. She has normal reflexes. She displays normal reflexes. No cranial nerve deficit. She exhibits normal muscle tone. Coordination normal.  05/03/2015 MMSE 18/30. Clock drawing poorly centered.  Skin: Skin is warm and dry. She is not diaphoretic.  BLE pigmented change and dependent rubor seen. R+L buttock  rash  Psychiatric: She has a normal mood and affect. Her behavior is normal. Judgment and thought content normal.     Labs reviewed: Lab Summary Latest Ref Rng 03/13/2015 10/19/2014 04/18/2014 01/31/2014  Hemoglobin 12.0 - 16.0 g/dL (None) 13.9 (None) 13.1  Hematocrit 36 - 46 % (None) 41 (None) 38  White count - (None) 4.2 (None) 4.1  Platelet count 150 - 399 K/L (None) 176 (None) 179  Sodium 137 - 147 mmol/L 135(A) 137 136(A) 130(A)  Potassium 3.4 - 5.3 mmol/L 3.8 4.1 3.6 3.8  Calcium - (None) (None) (None) (None)  Phosphorus - (None) (None) (None) (None)  Creatinine 0.5 - 1.1 mg/dL 0.6 0.6 0.7 0.7  AST 13 - 35 U/L 16 21 (None) 21  Alk Phos 25 - 125 U/L 69 87 (None) 85  Bilirubin - (None) (None) (None) (None)  Glucose - 83  76 83 127  Cholesterol - (None) (None) (None) (None)  HDL cholesterol - (None) (None) (None) (None)  Triglycerides - (None) (None) (None) (None)  LDL Direct - (None) (None) (None) (None)  LDL Calc - (None) (None) (None) (None)  Total protein - (None) (None) (None) (None)  Albumin - (None) (None) (None) (None)   Lab Results  Component Value Date   TSH 2.22 10/19/2014   Lab Results  Component Value Date   BUN 8 03/13/2015   No results found for: HGBA1C     Assessment/Plan  1. Malaise Possibly related to the depression or Alzheimer's disease  2. Depression Continue sertraline 100 mg daily for now  3. Alzheimer's disease *Donepezil. Consider addition of memantine next visit. - donepezil (ARICEPT) 5 MG tablet; One nightly to help preserve memory  Dispense: 28 tablet; Refill: 1  -Advance donepezil to 10 mg at bedtime after 28 nights.

## 2015-05-03 NOTE — Progress Notes (Signed)
Failed clock drawing  

## 2015-05-05 ENCOUNTER — Emergency Department (HOSPITAL_COMMUNITY): Payer: Medicare Other

## 2015-05-05 ENCOUNTER — Encounter (HOSPITAL_COMMUNITY): Payer: Self-pay | Admitting: Emergency Medicine

## 2015-05-05 ENCOUNTER — Inpatient Hospital Stay (HOSPITAL_COMMUNITY)
Admission: EM | Admit: 2015-05-05 | Discharge: 2015-05-12 | DRG: 480 | Disposition: A | Discharge status: Skilled Nursing Facility | Payer: Medicare Other | Attending: Internal Medicine | Admitting: Internal Medicine

## 2015-05-05 DIAGNOSIS — F0280 Dementia in other diseases classified elsewhere without behavioral disturbance: Secondary | ICD-10-CM | POA: Diagnosis present

## 2015-05-05 DIAGNOSIS — I739 Peripheral vascular disease, unspecified: Secondary | ICD-10-CM | POA: Diagnosis present

## 2015-05-05 DIAGNOSIS — Z79899 Other long term (current) drug therapy: Secondary | ICD-10-CM | POA: Diagnosis not present

## 2015-05-05 DIAGNOSIS — Z66 Do not resuscitate: Secondary | ICD-10-CM | POA: Diagnosis present

## 2015-05-05 DIAGNOSIS — Z90710 Acquired absence of both cervix and uterus: Secondary | ICD-10-CM

## 2015-05-05 DIAGNOSIS — F329 Major depressive disorder, single episode, unspecified: Secondary | ICD-10-CM | POA: Diagnosis present

## 2015-05-05 DIAGNOSIS — I509 Heart failure, unspecified: Secondary | ICD-10-CM | POA: Diagnosis present

## 2015-05-05 DIAGNOSIS — E871 Hypo-osmolality and hyponatremia: Secondary | ICD-10-CM | POA: Diagnosis present

## 2015-05-05 DIAGNOSIS — Z91011 Allergy to milk products: Secondary | ICD-10-CM

## 2015-05-05 DIAGNOSIS — Z01810 Encounter for preprocedural cardiovascular examination: Secondary | ICD-10-CM | POA: Diagnosis not present

## 2015-05-05 DIAGNOSIS — D696 Thrombocytopenia, unspecified: Secondary | ICD-10-CM | POA: Diagnosis present

## 2015-05-05 DIAGNOSIS — S72002A Fracture of unspecified part of neck of left femur, initial encounter for closed fracture: Secondary | ICD-10-CM | POA: Diagnosis not present

## 2015-05-05 DIAGNOSIS — G309 Alzheimer's disease, unspecified: Secondary | ICD-10-CM | POA: Diagnosis present

## 2015-05-05 DIAGNOSIS — E039 Hypothyroidism, unspecified: Secondary | ICD-10-CM | POA: Diagnosis present

## 2015-05-05 DIAGNOSIS — W19XXXA Unspecified fall, initial encounter: Secondary | ICD-10-CM | POA: Diagnosis present

## 2015-05-05 DIAGNOSIS — S72142A Displaced intertrochanteric fracture of left femur, initial encounter for closed fracture: Secondary | ICD-10-CM | POA: Diagnosis present

## 2015-05-05 DIAGNOSIS — Z7983 Long term (current) use of bisphosphonates: Secondary | ICD-10-CM

## 2015-05-05 DIAGNOSIS — M810 Age-related osteoporosis without current pathological fracture: Secondary | ICD-10-CM | POA: Diagnosis present

## 2015-05-05 DIAGNOSIS — L8990 Pressure ulcer of unspecified site, unspecified stage: Secondary | ICD-10-CM | POA: Diagnosis present

## 2015-05-05 DIAGNOSIS — D62 Acute posthemorrhagic anemia: Secondary | ICD-10-CM | POA: Diagnosis not present

## 2015-05-05 DIAGNOSIS — S7223XA Displaced subtrochanteric fracture of unspecified femur, initial encounter for closed fracture: Secondary | ICD-10-CM | POA: Diagnosis present

## 2015-05-05 DIAGNOSIS — I2510 Atherosclerotic heart disease of native coronary artery without angina pectoris: Secondary | ICD-10-CM | POA: Diagnosis present

## 2015-05-05 DIAGNOSIS — E43 Unspecified severe protein-calorie malnutrition: Secondary | ICD-10-CM | POA: Diagnosis present

## 2015-05-05 DIAGNOSIS — L89102 Pressure ulcer of unspecified part of back, stage 2: Secondary | ICD-10-CM | POA: Diagnosis present

## 2015-05-05 DIAGNOSIS — E876 Hypokalemia: Secondary | ICD-10-CM | POA: Diagnosis present

## 2015-05-05 DIAGNOSIS — Y92129 Unspecified place in nursing home as the place of occurrence of the external cause: Secondary | ICD-10-CM

## 2015-05-05 DIAGNOSIS — I272 Other secondary pulmonary hypertension: Secondary | ICD-10-CM | POA: Diagnosis present

## 2015-05-05 DIAGNOSIS — I071 Rheumatic tricuspid insufficiency: Secondary | ICD-10-CM | POA: Diagnosis present

## 2015-05-05 DIAGNOSIS — E860 Dehydration: Secondary | ICD-10-CM | POA: Diagnosis present

## 2015-05-05 DIAGNOSIS — T501X5A Adverse effect of loop [high-ceiling] diuretics, initial encounter: Secondary | ICD-10-CM | POA: Diagnosis present

## 2015-05-05 DIAGNOSIS — L89322 Pressure ulcer of left buttock, stage 2: Secondary | ICD-10-CM | POA: Diagnosis present

## 2015-05-05 DIAGNOSIS — I25118 Atherosclerotic heart disease of native coronary artery with other forms of angina pectoris: Secondary | ICD-10-CM | POA: Diagnosis not present

## 2015-05-05 DIAGNOSIS — I959 Hypotension, unspecified: Secondary | ICD-10-CM | POA: Diagnosis present

## 2015-05-05 DIAGNOSIS — Z419 Encounter for procedure for purposes other than remedying health state, unspecified: Secondary | ICD-10-CM

## 2015-05-05 DIAGNOSIS — M25552 Pain in left hip: Secondary | ICD-10-CM | POA: Diagnosis present

## 2015-05-05 LAB — CBC WITH DIFFERENTIAL/PLATELET
BASOS PCT: 0 %
Basophils Absolute: 0 10*3/uL (ref 0.0–0.1)
Eosinophils Absolute: 0 10*3/uL (ref 0.0–0.7)
Eosinophils Relative: 0 %
HEMATOCRIT: 36.2 % (ref 36.0–46.0)
HEMOGLOBIN: 13 g/dL (ref 12.0–15.0)
Lymphocytes Relative: 7 %
Lymphs Abs: 0.6 10*3/uL — ABNORMAL LOW (ref 0.7–4.0)
MCH: 31.6 pg (ref 26.0–34.0)
MCHC: 35.9 g/dL (ref 30.0–36.0)
MCV: 88.1 fL (ref 78.0–100.0)
MONOS PCT: 6 %
Monocytes Absolute: 0.5 10*3/uL (ref 0.1–1.0)
NEUTROS ABS: 7.7 10*3/uL (ref 1.7–7.7)
NEUTROS PCT: 87 %
Platelets: 123 10*3/uL — ABNORMAL LOW (ref 150–400)
RBC: 4.11 MIL/uL (ref 3.87–5.11)
RDW: 12.8 % (ref 11.5–15.5)
WBC: 8.8 10*3/uL (ref 4.0–10.5)

## 2015-05-05 LAB — URINALYSIS, ROUTINE W REFLEX MICROSCOPIC
GLUCOSE, UA: NEGATIVE mg/dL
KETONES UR: 15 mg/dL — AB
Leukocytes, UA: NEGATIVE
Nitrite: NEGATIVE
PH: 6 (ref 5.0–8.0)
Protein, ur: NEGATIVE mg/dL
SPECIFIC GRAVITY, URINE: 1.013 (ref 1.005–1.030)
Urobilinogen, UA: 0.2 mg/dL (ref 0.0–1.0)

## 2015-05-05 LAB — I-STAT CHEM 8, ED
BUN: 6 mg/dL (ref 6–20)
Calcium, Ion: 0.96 mmol/L — ABNORMAL LOW (ref 1.13–1.30)
Chloride: 80 mmol/L — ABNORMAL LOW (ref 101–111)
Creatinine, Ser: 0.6 mg/dL (ref 0.44–1.00)
Glucose, Bld: 80 mg/dL (ref 65–99)
HEMATOCRIT: 34 % — AB (ref 36.0–46.0)
HEMOGLOBIN: 11.6 g/dL — AB (ref 12.0–15.0)
POTASSIUM: 2.4 mmol/L — AB (ref 3.5–5.1)
Sodium: 127 mmol/L — ABNORMAL LOW (ref 135–145)
TCO2: 33 mmol/L (ref 0–100)

## 2015-05-05 LAB — COMPREHENSIVE METABOLIC PANEL
ALBUMIN: 3.5 g/dL (ref 3.5–5.0)
ALK PHOS: 78 U/L (ref 38–126)
ALT: 22 U/L (ref 14–54)
ANION GAP: 13 (ref 5–15)
AST: 39 U/L (ref 15–41)
BILIRUBIN TOTAL: 1.2 mg/dL (ref 0.3–1.2)
BUN: 12 mg/dL (ref 6–20)
CALCIUM: 8.2 mg/dL — AB (ref 8.9–10.3)
CO2: 39 mmol/L — AB (ref 22–32)
CREATININE: 0.65 mg/dL (ref 0.44–1.00)
Chloride: 72 mmol/L — ABNORMAL LOW (ref 101–111)
GFR calc Af Amer: >60 mL/min (ref >60)
GFR calc non Af Amer: >60 mL/min (ref >60)
GLUCOSE: 123 mg/dL — AB (ref 65–99)
Potassium: <2 mmol/L — CL (ref 3.5–5.1)
Sodium: 124 mmol/L — ABNORMAL LOW (ref 135–145)
TOTAL PROTEIN: 5.8 g/dL — AB (ref 6.5–8.1)

## 2015-05-05 LAB — BASIC METABOLIC PANEL
ANION GAP: 9 (ref 5–15)
BUN: 8 mg/dL (ref 6–20)
CALCIUM: 7.7 mg/dL — AB (ref 8.9–10.3)
CO2: 36 mmol/L — AB (ref 22–32)
Chloride: 82 mmol/L — ABNORMAL LOW (ref 101–111)
Creatinine, Ser: 0.44 mg/dL (ref 0.44–1.00)
Glucose, Bld: 83 mg/dL (ref 65–99)
Potassium: 2.7 mmol/L — CL (ref 3.5–5.1)
Sodium: 127 mmol/L — ABNORMAL LOW (ref 135–145)

## 2015-05-05 LAB — URINE MICROSCOPIC-ADD ON

## 2015-05-05 LAB — GLUCOSE, CAPILLARY
GLUCOSE-CAPILLARY: 69 mg/dL (ref 65–99)
GLUCOSE-CAPILLARY: 142 mg/dL — AB (ref 65–99)

## 2015-05-05 LAB — MAGNESIUM: MAGNESIUM: 1.6 mg/dL — AB (ref 1.7–2.4)

## 2015-05-05 LAB — MRSA PCR SCREENING: MRSA BY PCR: NEGATIVE

## 2015-05-05 MED ORDER — POTASSIUM CHLORIDE 10 MEQ/100ML IV SOLN
10.0000 meq | INTRAVENOUS | Status: AC
  Administered 2015-05-05 (×3): 10 meq via INTRAVENOUS
  Filled 2015-05-05 (×3): qty 100

## 2015-05-05 MED ORDER — PANTOPRAZOLE SODIUM 40 MG PO TBEC
40.0000 mg | DELAYED_RELEASE_TABLET | Freq: Every day | ORAL | Status: DC
  Filled 2015-05-05: qty 1

## 2015-05-05 MED ORDER — ACETAMINOPHEN 325 MG PO TABS
650.0000 mg | ORAL_TABLET | Freq: Four times a day (QID) | ORAL | Status: DC | PRN
  Administered 2015-05-08: 325 mg via ORAL
  Filled 2015-05-05: qty 2

## 2015-05-05 MED ORDER — POTASSIUM CHLORIDE CRYS ER 20 MEQ PO TBCR
40.0000 meq | EXTENDED_RELEASE_TABLET | Freq: Once | ORAL | Status: DC
  Filled 2015-05-05: qty 2

## 2015-05-05 MED ORDER — POTASSIUM CHLORIDE 10 MEQ/100ML IV SOLN
10.0000 meq | INTRAVENOUS | Status: AC
Start: 2015-05-05 — End: 2015-05-05
  Administered 2015-05-05 (×3): 10 meq via INTRAVENOUS
  Filled 2015-05-05 (×3): qty 100

## 2015-05-05 MED ORDER — LORAZEPAM 1 MG PO TABS
1.0000 mg | ORAL_TABLET | Freq: Every day | ORAL | Status: DC
  Filled 2015-05-05: qty 1

## 2015-05-05 MED ORDER — DONEPEZIL HCL 5 MG PO TABS
5.0000 mg | ORAL_TABLET | Freq: Every day | ORAL | Status: DC
  Filled 2015-05-05: qty 1

## 2015-05-05 MED ORDER — POLYETHYL GLYCOL-PROPYL GLYCOL 0.4-0.3 % OP SOLN: 1.0000 [drp] | OPHTHALMIC | Status: DC | PRN

## 2015-05-05 MED ORDER — SODIUM CHLORIDE 0.9 % IV BOLUS (SEPSIS)
500.0000 mL | Freq: Once | INTRAVENOUS | Status: AC
  Administered 2015-05-05: 500 mL via INTRAVENOUS

## 2015-05-05 MED ORDER — POTASSIUM CHLORIDE 10 MEQ/100ML IV SOLN
10.0000 meq | INTRAVENOUS | Status: AC
  Administered 2015-05-05 (×2): 10 meq via INTRAVENOUS
  Filled 2015-05-05 (×3): qty 100

## 2015-05-05 MED ORDER — MIRABEGRON ER 25 MG PO TB24
25.0000 mg | ORAL_TABLET | Freq: Every day | ORAL | Status: DC
  Filled 2015-05-05 (×2): qty 1

## 2015-05-05 MED ORDER — POLYVINYL ALCOHOL 1.4 % OP SOLN
1.0000 [drp] | OPHTHALMIC | Status: DC | PRN
  Administered 2015-05-09: 2 [drp] via OPHTHALMIC
  Filled 2015-05-05: qty 15

## 2015-05-05 MED ORDER — SODIUM CHLORIDE 0.9 % IV SOLN
INTRAVENOUS | Status: DC
  Administered 2015-05-05: 16:00:00 via INTRAVENOUS

## 2015-05-05 MED ORDER — HYDROMORPHONE HCL 1 MG/ML IJ SOLN: 0.5000 mg | INTRAMUSCULAR | Status: DC | PRN

## 2015-05-05 MED ORDER — DEXTROSE 50 % IV SOLN
INTRAVENOUS | Status: AC
  Administered 2015-05-05: 25 mL
  Filled 2015-05-05: qty 50

## 2015-05-05 MED ORDER — POTASSIUM CHLORIDE IN NACL 40-0.9 MEQ/L-% IV SOLN
INTRAVENOUS | Status: DC
  Administered 2015-05-05: 75 mL/h via INTRAVENOUS
  Filled 2015-05-05 (×2): qty 1000

## 2015-05-05 MED ORDER — BENEPROTEIN PO POWD
1.0000 | Freq: Every day | ORAL | Status: DC
  Filled 2015-05-05: qty 227

## 2015-05-05 MED ORDER — ENOXAPARIN SODIUM 30 MG/0.3ML ~~LOC~~ SOLN
30.0000 mg | SUBCUTANEOUS | Status: AC
  Administered 2015-05-06: 30 mg via SUBCUTANEOUS
  Filled 2015-05-05 (×3): qty 0.3

## 2015-05-05 MED ORDER — ACETAMINOPHEN 650 MG RE SUPP: 650.0000 mg | Freq: Four times a day (QID) | RECTAL | Status: DC | PRN

## 2015-05-05 MED ORDER — RESOURCE THICKENUP CLEAR PO POWD
ORAL | Status: DC | PRN
  Filled 2015-05-05: qty 125

## 2015-05-05 MED ORDER — SIMETHICONE 80 MG PO CHEW
80.0000 mg | CHEWABLE_TABLET | Freq: Three times a day (TID) | ORAL | Status: DC | PRN
  Filled 2015-05-05: qty 1

## 2015-05-05 MED ORDER — SERTRALINE HCL 100 MG PO TABS
100.0000 mg | ORAL_TABLET | Freq: Every day | ORAL | Status: DC
  Filled 2015-05-05: qty 1
  Filled 2015-05-05: qty 2

## 2015-05-05 MED ORDER — SODIUM CHLORIDE 0.9 % IV SOLN
Freq: Once | INTRAVENOUS | Status: AC
  Administered 2015-05-05: 08:00:00 via INTRAVENOUS

## 2015-05-05 MED ORDER — LEVOTHYROXINE SODIUM 50 MCG PO TABS
50.0000 ug | ORAL_TABLET | Freq: Every day | ORAL | Status: DC
  Filled 2015-05-05 (×3): qty 1

## 2015-05-05 MED ORDER — CALCIUM CARBONATE-VITAMIN D 500-200 MG-UNIT PO TABS
1.0000 | ORAL_TABLET | Freq: Two times a day (BID) | ORAL | Status: DC
  Filled 2015-05-05 (×2): qty 1

## 2015-05-05 MED ORDER — ONDANSETRON HCL 4 MG PO TABS: 4.0000 mg | ORAL_TABLET | Freq: Four times a day (QID) | ORAL | Status: DC | PRN

## 2015-05-05 MED ORDER — VITAMIN A 10000 UNITS PO CAPS: 10000.0000 [IU] | ORAL_CAPSULE | Freq: Every day | ORAL | Status: DC

## 2015-05-05 MED ORDER — ONDANSETRON HCL 4 MG/2ML IJ SOLN
4.0000 mg | Freq: Four times a day (QID) | INTRAMUSCULAR | Status: DC | PRN
  Administered 2015-05-09: 4 mg via INTRAVENOUS

## 2015-05-05 NOTE — ED Notes (Signed)
Pt is A&O to self, DOB and current year. Pt in NAD. Will continue to monitor

## 2015-05-05 NOTE — Progress Notes (Signed)
Spoke with Laura Benitez, her emergency contact, tonight regarding patient's condition.  She states the patient's Laura Benitez is her nephew who lives in [place].  She does not have his contact info readily available and will call the hospital operator with that information if she is able to obtain it.  From an ortho standpoint, the recommendation is to perform surgery on the patient once she is medically optimized and we have consent from her HCPOA.  Patient was ambulatory at baseline and would benefit from surgery.  Please contact me with anytime with any updates that may be relevant to her hip fracture.    Azucena Cecil, MD [facility] [phone] 9:31 PM

## 2015-05-05 NOTE — ED Notes (Addendum)
Pt unable to swallow PO meds with thickened liquid. Pt choked with attempt, but resolved quickly. Pt does not want to attempt again. Talked to hospitalist who requested to keep pt NPO until she can have further evaluation by Dr Thereasa Solo

## 2015-05-05 NOTE — H&P (Signed)
[facility] Admission History and Physical       RONIQUA KINTZ BHA:[id number] DOB: August 09, 1929 DOA: 05/05/2015  Referring physician: EDP PCP: [name], MD  Specialists:   Chief Complaint: Fall  HPI: Laura Benitez is a 81 y.o. female with a history of Alzheimer's Disease, CAD, PAD, Hypothyroid, and Osteoporosis who was found on the floor in her room at  the [facility].  The fall was not witnessed.  She had complaints of left hip pain.   She was evaluated in the ED and found tohave multiple electrolyte abnormalities, and imaging studies were performed and X-rays of the Left  Hip revealed a possible cortical fracture of the Left femoral head, so a CT scan of the hip was ordered.   She was also hypotensive and referred for admission.      Review of Systems: Unable to Obtain from the Patient  Past Medical History  Diagnosis Date  . Anemia   . Depression   . Anxiety   . Fracture of lumbar spine     History in 2000  . Allergy   . Migraine headache   . GERD (gastroesophageal reflux disease)   . UTI (urinary tract infection)   . Otalgia, unspecified 10/14/2012  . Stage I pressure ulcer of back 10/14/2012  . Shortness of breath 10/14/2012  . 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 Chronic fatigue syndrome 02/09/2012  . Abnormality of gait 01/30/2012  . Senile osteoporosis 12/25/2011  . Congestive heart failure, unspecified 12/10/2011  . Acute bronchitis 12/04/2011  . Closed fracture of lateral malleolus 11/27/2011  . Edema 10/30/2011  . Peripheral vascular disease, unspecified 10/20/2011  . Urinary frequency 09/25/2011  . Throat pain 08/13/2011  . Candidiasis of other urogenital sites 07/21/2011  . Unspecified hypothyroidism 07/21/2011  . Personality change due to conditions classified elsewhere 07/21/2011  . Cracked tooth 07/21/2011  . Candidiasis of mouth 07/16/2011  . Neoplasm of uncertain behavior of skin 07/16/2011  . Lumbago 07/16/2011  . Dysphagia 07/16/2011  .  Hyposmolality and/or hyponatremia 07/15/2011  . Acute posthemorrhagic anemia 07/15/2011  . Major depressive disorder, single episode, unspecified 07/15/2011  . Unspecified constipation 07/15/2011  . Pain in joint, pelvic region and thigh 07/15/2011  . Other closed fractures of distal end of radius (alone) 07/15/2011  . Closed fracture of distal end of ulna (alone) 07/15/2011  . Other orthopedic aftercare(V54.89) 07/15/2011  . CAD (coronary artery disease) 01/11/2015  . PAD (peripheral artery disease) 01/11/2015  . Alzheimer's disease 05/03/2015     Past Surgical History  Procedure Laterality Date  . Tonsillectomy      @age  4  . Total abdominal hysterectomy    . Hip arthroplasty  07/10/2011    Procedure: ARTHROPLASTY BIPOLAR HIP;  Surgeon: Rozanna Box, MD;  Location: [facility];  Service: Orthopedics;  Laterality: Right;  . Orif wrist fracture  07/10/2011    Procedure: OPEN REDUCTION INTERNAL FIXATION (ORIF) WRIST FRACTURE;  Surgeon: Rozanna Box, MD;  Location: [facility];  Service: Orthopedics;  Laterality: Right;      Prior to Admission medications   Medication Sig Start Date End Date Taking? Authorizing Provider  ACTONEL 150 MG tablet Take 150 mg by mouth every 30 (thirty) days. Take  1 tablet once a month on the 29th of the month. 12/03/12  Yes Historical Provider, MD  alum & mag hydroxide-simeth (MINTOX) 200-200-20 MG/5ML suspension Take 30 mLs by mouth 3 (three) times daily. Take 30 ml three times daily as needed  for indigestion.   Yes Historical Provider, MD  calcium-vitamin D (OSCAL WITH D) 500-200 MG-UNIT per tablet Take 1 tablet by mouth 2 (two) times daily.   Yes Historical Provider, MD  fluticasone (FLONASE) 50 MCG/ACT nasal spray Place 2 sprays into the nose daily. Daily 12/23/12  Yes Historical Provider, MD  furosemide (LASIX) 40 MG tablet Take 40 mg by mouth daily. Take one daily 12/31/12  Yes Historical Provider, MD  levothyroxine (SYNTHROID, LEVOTHROID) 50 MCG tablet Take 50  mcg by mouth daily. Take 1 tablet by mouth daily.   Yes Historical Provider, MD  loperamide (IMODIUM) 2 MG capsule Take 2-4 mg by mouth 3 (three) times daily as needed for diarrhea or loose stools.   Yes Historical Provider, MD  LORazepam (ATIVAN) 1 MG tablet Take one tablet by mouth at bedtime for insomnia 12/26/14  Yes [name], NP  MYRBETRIQ 25 MG TB24 Take 25 mg by mouth daily. Take one tablet daily 12/22/12  Yes Historical Provider, MD  omeprazole (PRILOSEC) 40 MG capsule Take 40 mg by mouth daily.   Yes Historical Provider, MD  Polyethyl Glycol-Propyl Glycol (SYSTANE) 0.4-0.3 % SOLN Apply 1 drop to eye every 4 (four) hours as needed (for eye discomfort). Both eyes as needed for comfort   Yes Historical Provider, MD  protein supplement (RESOURCE BENEPROTEIN) POWD Take 1 scoop by mouth daily. MIX 1 SCOOP IN 4 OZ. OF HONEY THICK H20 AND TAKE BY MOUTH AT 2 PM.   Yes Historical Provider, MD  psyllium (METAMUCIL) 58.6 % powder Take 1 packet by mouth daily as needed (for constipation). Take teaspoonful mixed in 8oz fluide daily as needed   Yes Historical Provider, MD  sertraline (ZOLOFT) 100 MG tablet One daily to help depression and anxiety 03/13/15  Yes [name], MD  simethicone (MYLICON) 537 MG chewable tablet Chew 125 mg by mouth 3 (three) times daily as needed for flatulence.   Yes Historical Provider, MD  sodium chloride (DEEP SEA NASAL SPRAY) 0.65 % nasal spray Place 1 spray into the nose. Use 1 spray in each nostril as needed   Yes Historical Provider, MD  vitamin A 10000 UNIT capsule Take 10,000 Units by mouth daily.   Yes Historical Provider, MD  donepezil (ARICEPT) 5 MG tablet One nightly to help preserve memory 05/03/15   [name], MD     Allergies  Allergen Reactions  . Milk-Related Compounds Other (See Comments)    Milk products cause patient to have migraine headaches.  Pt can tolerate butter.    Social History:  reports that she has never smoked. She has never  used smokeless tobacco. She reports that she does not drink alcohol or use illicit drugs.     Family History  Problem Relation Age of Onset  . Lung cancer Father   . Colon cancer Neg Hx        Physical Exam:  GEN:  Pleasant Confused Elderly Cachectic 82 y.o. Caucasian female examined and in no acute distress; cooperative with exam Filed Vitals:   05/05/15 0206 05/05/15 0208 05/05/15 0407 05/05/15 0450  BP:  101/68 90/58 96/59   Pulse:  79 74 79  Temp:   97.9 F (36.6 C) 97.3 F (36.3 C)  TempSrc:   Oral Oral  Resp:  19 15 23   SpO2: 98% 97% 98% 97%   Blood pressure 96/59, pulse 79, temperature 97.3 F (36.3 C), temperature source Oral, resp. rate 23, SpO2 97 %. PSYCH: She is alert and oriented  x4; does not appear anxious does not appear depressed; affect is normal HEENT: Normocephalic and Atraumatic, Mucous membranes pink; PERRLA; EOM intact; Fundi:  Benign;  No scleral icterus, Nares: Patent, Oropharynx: Clear, Fair Dentition,    Neck:  FROM, No Cervical Lymphadenopathy nor Thyromegaly or Carotid Bruit; No JVD; Breasts:: Not examined CHEST WALL: No tenderness CHEST: Normal respiration, clear to auscultation bilaterally HEART: Regular rate and rhythm; no murmurs rubs or gallops BACK: No kyphosis or scoliosis; No CVA tenderness ABDOMEN: Positive Bowel Sounds, Scaphoid, Soft Non-Tender, No Rebound or Guarding; No Masses, No Organomegaly Rectal Exam: Not done EXTREMITIES: No Cyanosis, Clubbing, or Edema; No Ulcerations. Genitalia: not examined PULSES: 2+ and symmetric SKIN: Normal hydration no rash or ulceration CNS:  Alert and Oriented x 1, No Focal Deficits Vascular: pulses palpable throughout    Labs on Admission:  Basic Metabolic Panel:  Recent Labs Lab 05/05/15 0233  NA 124*  K <2.0*  CL 72*  CO2 39*  GLUCOSE 123*  BUN 12  CREATININE 0.65  CALCIUM 8.2*   Liver Function Tests:  Recent Labs Lab 05/05/15 0233  AST 39  ALT 22  ALKPHOS 78  BILITOT 1.2    PROT 5.8*  ALBUMIN 3.5   No results for input(s): LIPASE, AMYLASE in the last 168 hours. No results for input(s): AMMONIA in the last 168 hours. CBC:  Recent Labs Lab 05/05/15 0233  WBC 8.8  NEUTROABS 7.7  HGB 13.0  HCT 36.2  MCV 88.1  PLT 123*   Cardiac Enzymes: No results for input(s): CKTOTAL, CKMB, CKMBINDEX, TROPONINI in the last 168 hours.  BNP (last 3 results) No results for input(s): BNP in the last 8760 hours.  ProBNP (last 3 results) No results for input(s): PROBNP in the last 8760 hours.  CBG: No results for input(s): GLUCAP in the last 168 hours.  Radiological Exams on Admission: Ct Head Wo Contrast  05/05/2015   CLINICAL DATA:  81 year old female with fall  EXAM: CT HEAD WITHOUT CONTRAST  CT CERVICAL SPINE WITHOUT CONTRAST  TECHNIQUE: Multidetector CT imaging of the head and cervical spine was performed following the standard protocol without intravenous contrast. Multiplanar CT image reconstructions of the cervical spine were also generated.  COMPARISON:  None.  FINDINGS: CT HEAD FINDINGS  There is mild dilatation of the ventricles and sulci compatible with age-related atrophy. Periventricular and deep white matter hypodensities represent chronic microvascular ischemic changes. Left frontal old infarct and encephalomalacia. There is a small focal area of encephalomalacia changes in the anterior left temporal lobe, likely related to an old insult. There is no intracranial hemorrhage. No mass effect or midline shift identified.  The visualized paranasal sinuses and mastoid air cells are well aerated. A small focal lucency noted in the occiput. The calvarium is intact.  CT CERVICAL SPINE FINDINGS  There is no acute fracture or subluxation of the cervical spine.There is advanced osteopenia. Multilevel degenerative changes.The odontoid and spinous processes are intact.There is normal anatomic alignment of the C1-C2 lateral masses. The visualized soft tissues appear  unremarkable.  IMPRESSION: No acute intracranial pathology.  Age-related atrophy and chronic microvascular ischemic disease. 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  Advanced osteopenia.  No acute/traumatic cervical spine pathology.   Electronically Signed   By: Anner Crete M.D.   On: 05/05/2015 03:58   Ct Cervical Spine Wo Contrast  05/05/2015   CLINICAL DATA:  81 year old female with fall  EXAM: CT HEAD WITHOUT CONTRAST  CT CERVICAL SPINE WITHOUT CONTRAST  TECHNIQUE: Multidetector  CT imaging of the head and cervical spine was performed following the standard protocol without intravenous contrast. Multiplanar CT image reconstructions of the cervical spine were also generated.  COMPARISON:  None.  FINDINGS: CT HEAD FINDINGS  There is mild dilatation of the ventricles and sulci compatible with age-related atrophy. Periventricular and deep white matter hypodensities represent chronic microvascular ischemic changes. Left frontal old infarct and encephalomalacia. There is a small focal area of encephalomalacia changes in the anterior left temporal lobe, likely related to an old insult. There is no intracranial hemorrhage. No mass effect or midline shift identified.  The visualized paranasal sinuses and mastoid air cells are well aerated. A small focal lucency noted in the occiput. The calvarium is intact.  CT CERVICAL SPINE FINDINGS  There is no acute fracture or subluxation of the cervical spine.There is advanced osteopenia. Multilevel degenerative changes.The odontoid and spinous processes are intact.There is normal anatomic alignment of the C1-C2 lateral masses. The visualized soft tissues appear unremarkable.  IMPRESSION: No acute intracranial pathology.  Age-related atrophy and chronic microvascular ischemic disease. 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  Advanced osteopenia.  No acute/traumatic cervical spine pathology.   Electronically Signed   By: Anner Crete M.D.   On:  05/05/2015 03:58   Dg Hip Unilat With Pelvis 2-3 Views Left  05/05/2015   CLINICAL DATA:  81 year old female with fall and left hip pain.  EXAM: DG HIP (WITH OR WITHOUT PELVIS) 2-3V LEFT  COMPARISON:  None.  FINDINGS: There is severe osteopenia which limits evaluation of the study for fracture. There is a right total hip arthroplasty. There is a focal area of cortical discontinue the in the medial portion of the left femoral head, likely artifactual. A cortical fracture is less likely. Clinical correlation is recommended. No definite fracture identified. There is no dislocation on the provided images. CT may provide better evaluation for fracture if clinically indicated.  The soft tissues are grossly unremarkable.  IMPRESSION: Severe osteopenia.  No definite acute fracture or dislocation.  Apparent cortical discontinuity along the medial aspect of the left femoral head is most likely artifactual and less likely represents a cortical fracture. Clinical correlation is recommended.   Electronically Signed   By: Anner Crete M.D.   On: 05/05/2015 03:51     EKG: Independently reviewed.  Normal Sinus Rhythm Rate=87 with diffuse artifact    Assessment/Plan:   81 y.o. female with  Principal Problem:   1.    Hypokalemia- due to Lasix Rx without KCl supplementation   IV KCl Replacement   Check Magnesium Level   Monitor Electrolytes   Hold Lasix Rx  Active Problems:   2.    Hyponatremia- due to Diuretic Rx and Dehydration   Hold Lasix Rx   IVFs with NSS   Check Urine NA+ and Urine Osm in 24 hours       3.    Hypotension- due to dehydration   IVFs   Hold Lasix     4.    Fall at nursing home   Fall Precautions     5.    Left hip pain   Ct scan ordered to confirm whether a Fracture is present or not      6.    Osteoporosis-   On Actonel Rx     7.    CAD (coronary artery disease)-   Cardiac monitoring     8.    PAD (peripheral artery disease)   chronic  9.     Hypothyroidism   Continue Levothyroxine Rx   Check TSH level    10.    Alzheimer's disease   Continue Aricept Rx    11.    Thrombocytopenia   Monitor PLTs    12.    DVT Prophylaxis   Lovenox        Code Status:      DO NOT RESUSCITATE (DNR)      Family Communication:   No Family Present    Disposition Plan:    Inpatient Status        Time spent:  [facility] Pager [phone]   If [place] Please Contact the Day Rounding Team MD for [facility]  If 7PM-7AM, Please Contact Night-Floor Coverage  [email or web address] Password [email or web address] 05/05/2015, 5:43 AM     ADDENDUM:   Patient was seen and examined on 05/05/2015

## 2015-05-05 NOTE — ED Notes (Signed)
Bed: WA16 Expected date:  Expected time:  Means of arrival:  Comments: EMS 

## 2015-05-05 NOTE — Progress Notes (Signed)
Follow up note.  Patient admitted this morning by Dr Arnoldo Morale. Dr Lita Mains contacted me this morning regarding comminuted and displaced intertrochanteric hip fracture. Spoke with Ortho who has recommended transfer to [facility]. I spoke with Dr Thereasa Solo who has accepted patient to SDU.

## 2015-05-05 NOTE — ED Provider Notes (Signed)
CSN: [id number]     Arrival date & time 05/05/15  0202 History  This chart was scribed for Julianne Rice, MD by Irene Pap, ED Scribe. This patient was seen in room WA16/WA16 and patient care was started at 2:23 AM.     Chief Complaint  Patient presents with  . Fall    Patient had an unwitness fall. Patient has some blood on back of head per EMS.   The history is provided by the patient. No language interpreter was used.  HPI Comments (Level 5 Caveat due to condition of the patient): Laura Benitez is a 81 y.o. female with hx of abnormality of gait, senile osteoporosis, CAD, PAD, and Alzheimer's Disease brought in by EMS who presents to the Emergency Department after being found by nursing home staff on the floor. Per triage note, the pt had an unwitnessed fall and was found to have blood on the back of her head by EMS. Patient is unable to contribute to history.  Past Medical History  Diagnosis Date  . Anemia   . Depression   . Anxiety   . Fracture of lumbar spine     History in 2000  . Allergy   . Migraine headache   . GERD (gastroesophageal reflux disease)   . UTI (urinary tract infection)   . Otalgia, unspecified 10/14/2012  . Stage I pressure ulcer of back 10/14/2012  . Shortness of breath 10/14/2012  . 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 Chronic fatigue syndrome 02/09/2012  . Abnormality of gait 01/30/2012  . Senile osteoporosis 12/25/2011  . Congestive heart failure, unspecified 12/10/2011  . Acute bronchitis 12/04/2011  . Closed fracture of lateral malleolus 11/27/2011  . Edema 10/30/2011  . Peripheral vascular disease, unspecified 10/20/2011  . Urinary frequency 09/25/2011  . Throat pain 08/13/2011  . Candidiasis of other urogenital sites 07/21/2011  . Unspecified hypothyroidism 07/21/2011  . Personality change due to conditions classified elsewhere 07/21/2011  . Cracked tooth 07/21/2011  . Candidiasis of mouth 07/16/2011  . Neoplasm of uncertain behavior of skin 07/16/2011  .  Lumbago 07/16/2011  . Dysphagia 07/16/2011  . Hyposmolality and/or hyponatremia 07/15/2011  . Acute posthemorrhagic anemia 07/15/2011  . Major depressive disorder, single episode, unspecified 07/15/2011  . Unspecified constipation 07/15/2011  . Pain in joint, pelvic region and thigh 07/15/2011  . Other closed fractures of distal end of radius (alone) 07/15/2011  . Closed fracture of distal end of ulna (alone) 07/15/2011  . Other orthopedic aftercare(V54.89) 07/15/2011  . CAD (coronary artery disease) 01/11/2015  . PAD (peripheral artery disease) 01/11/2015  . Alzheimer's disease 05/03/2015   Past Surgical History  Procedure Laterality Date  . Tonsillectomy      @age  4  . Total abdominal hysterectomy    . Hip arthroplasty  07/10/2011    Procedure: ARTHROPLASTY BIPOLAR HIP;  Surgeon: Rozanna Box, MD;  Location: [facility];  Service: Orthopedics;  Laterality: Right;  . Orif wrist fracture  07/10/2011    Procedure: OPEN REDUCTION INTERNAL FIXATION (ORIF) WRIST FRACTURE;  Surgeon: Rozanna Box, MD;  Location: [facility];  Service: Orthopedics;  Laterality: Right;   Family History  Problem Relation Age of Onset  . Lung cancer Father   . Colon cancer Neg Hx    Social History  Substance Use Topics  . Smoking status: Never Smoker   . Smokeless tobacco: Never Used  . Alcohol Use: No   OB History    No data available     Review of  Systems  Unable to perform ROS     Allergies  Milk-related compounds  Home Medications   Prior to Admission medications   Medication Sig Start Date End Date Taking? Authorizing Provider  ACTONEL 150 MG tablet Take 150 mg by mouth every 30 (thirty) days. Take  1 tablet once a month on the 29th of the month. 12/03/12  Yes Historical Provider, MD  alum & mag hydroxide-simeth (MINTOX) 200-200-20 MG/5ML suspension Take 30 mLs by mouth 3 (three) times daily. Take 30 ml three times daily as needed for indigestion.   Yes Historical Provider, MD   calcium-vitamin D (OSCAL WITH D) 500-200 MG-UNIT per tablet Take 1 tablet by mouth 2 (two) times daily.   Yes Historical Provider, MD  fluticasone (FLONASE) 50 MCG/ACT nasal spray Place 2 sprays into the nose daily. Daily 12/23/12  Yes Historical Provider, MD  furosemide (LASIX) 40 MG tablet Take 40 mg by mouth daily. Take one daily 12/31/12  Yes Historical Provider, MD  levothyroxine (SYNTHROID, LEVOTHROID) 50 MCG tablet Take 50 mcg by mouth daily. Take 1 tablet by mouth daily.   Yes Historical Provider, MD  loperamide (IMODIUM) 2 MG capsule Take 2-4 mg by mouth 3 (three) times daily as needed for diarrhea or loose stools.   Yes Historical Provider, MD  LORazepam (ATIVAN) 1 MG tablet Take one tablet by mouth at bedtime for insomnia 12/26/14  Yes [name], NP  MYRBETRIQ 25 MG TB24 Take 25 mg by mouth daily. Take one tablet daily 12/22/12  Yes Historical Provider, MD  omeprazole (PRILOSEC) 40 MG capsule Take 40 mg by mouth daily.   Yes Historical Provider, MD  Polyethyl Glycol-Propyl Glycol (SYSTANE) 0.4-0.3 % SOLN Apply 1 drop to eye every 4 (four) hours as needed (for eye discomfort). Both eyes as needed for comfort   Yes Historical Provider, MD  protein supplement (RESOURCE BENEPROTEIN) POWD Take 1 scoop by mouth daily. MIX 1 SCOOP IN 4 OZ. OF HONEY THICK H20 AND TAKE BY MOUTH AT 2 PM.   Yes Historical Provider, MD  psyllium (METAMUCIL) 58.6 % powder Take 1 packet by mouth daily as needed (for constipation). Take teaspoonful mixed in 8oz fluide daily as needed   Yes Historical Provider, MD  sertraline (ZOLOFT) 100 MG tablet One daily to help depression and anxiety 03/13/15  Yes [name], MD  simethicone (MYLICON) 025 MG chewable tablet Chew 125 mg by mouth 3 (three) times daily as needed for flatulence.   Yes Historical Provider, MD  sodium chloride (DEEP SEA NASAL SPRAY) 0.65 % nasal spray Place 1 spray into the nose. Use 1 spray in each nostril as needed   Yes Historical Provider, MD   vitamin A 10000 UNIT capsule Take 10,000 Units by mouth daily.   Yes Historical Provider, MD  donepezil (ARICEPT) 5 MG tablet One nightly to help preserve memory 05/03/15   [name], MD   BP 96/59 mmHg  Pulse 79  Temp(Src) 97.3 F (36.3 C) (Oral)  Resp 23  SpO2 97% Physical Exam  Constitutional: She appears well-developed and well-nourished. No distress.  Chronically ill-appearing. Drowsy.  HENT:  Head: Normocephalic and atraumatic.  Mouth/Throat: Oropharynx is clear and moist.  Dry mucous membranes  Eyes: EOM are normal. Pupils are equal, round, and reactive to light.  Neck: Normal range of motion. Neck supple.  No posterior midline cervical tenderness to palpation.  Cardiovascular: Normal rate and regular rhythm.  Exam reveals no gallop and no friction rub.   No  murmur heard. Pulmonary/Chest: Effort normal and breath sounds normal. No respiratory distress. She has no wheezes. She has no rales. She exhibits no tenderness.  Abdominal: Soft. Bowel sounds are normal. She exhibits no distension and no mass. There is no tenderness. There is no rebound and no guarding.  Musculoskeletal: Normal range of motion. She exhibits no edema or tenderness.  Kyphosis without midline point thoracic or lumbar tenderness. Patient does have some mild grimacing with range of motion of the left hip. Distal pulses are intact.  Neurological:  Patient appears to be moving all extremities. She is drowsy but is arousable with stimulation. Following very simple commands.  Skin: Skin is warm and dry. No rash noted. No erythema.  Psychiatric: She has a normal mood and affect. Her behavior is normal.  Nursing note and vitals reviewed.   ED Course  Procedures (including critical care time) DIAGNOSTIC STUDIES: Oxygen Saturation is 97% on RA, normal by my interpretation.    COORDINATION OF CARE: 2:26 AM- CT scans and labs  Labs Review Labs Reviewed  CBC WITH DIFFERENTIAL/PLATELET - Abnormal; Notable  for the following:    Platelets 123 (*)    Lymphs Abs 0.6 (*)    All other components within normal limits  COMPREHENSIVE METABOLIC PANEL - Abnormal; Notable for the following:    Sodium 124 (*)    Potassium <2.0 (*)    Chloride 72 (*)    CO2 39 (*)    Glucose, Bld 123 (*)    Calcium 8.2 (*)    Total Protein 5.8 (*)    All other components within normal limits  URINALYSIS, ROUTINE W REFLEX MICROSCOPIC (NOT AT [facility]) - Abnormal; Notable for the following:    Hgb urine dipstick SMALL (*)    Bilirubin Urine SMALL (*)    Ketones, ur 15 (*)    All other components within normal limits  URINE MICROSCOPIC-ADD ON - Abnormal; Notable for the following:    Casts HYALINE CASTS (*)    All other components within normal limits    Imaging Review Ct Head Wo Contrast  05/05/2015   CLINICAL DATA:  81 year old female with fall  EXAM: CT HEAD WITHOUT CONTRAST  CT CERVICAL SPINE WITHOUT CONTRAST  TECHNIQUE: Multidetector CT imaging of the head and cervical spine was performed following the standard protocol without intravenous contrast. Multiplanar CT image reconstructions of the cervical spine were also generated.  COMPARISON:  None.  FINDINGS: CT HEAD FINDINGS  There is mild dilatation of the ventricles and sulci compatible with age-related atrophy. Periventricular and deep white matter hypodensities represent chronic microvascular ischemic changes. Left frontal old infarct and encephalomalacia. There is a small focal area of encephalomalacia changes in the anterior left temporal lobe, likely related to an old insult. There is no intracranial hemorrhage. No mass effect or midline shift identified.  The visualized paranasal sinuses and mastoid air cells are well aerated. A small focal lucency noted in the occiput. The calvarium is intact.  CT CERVICAL SPINE FINDINGS  There is no acute fracture or subluxation of the cervical spine.There is advanced osteopenia. Multilevel degenerative changes.The odontoid and  spinous processes are intact.There is normal anatomic alignment of the C1-C2 lateral masses. The visualized soft tissues appear unremarkable.  IMPRESSION: No acute intracranial pathology.  Age-related atrophy and chronic microvascular ischemic disease. 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  Advanced osteopenia.  No acute/traumatic cervical spine pathology.   Electronically Signed   By: Anner Crete M.D.   On: 05/05/2015 03:58  Ct Cervical Spine Wo Contrast  05/05/2015   CLINICAL DATA:  81 year old female with fall  EXAM: CT HEAD WITHOUT CONTRAST  CT CERVICAL SPINE WITHOUT CONTRAST  TECHNIQUE: Multidetector CT imaging of the head and cervical spine was performed following the standard protocol without intravenous contrast. Multiplanar CT image reconstructions of the cervical spine were also generated.  COMPARISON:  None.  FINDINGS: CT HEAD FINDINGS  There is mild dilatation of the ventricles and sulci compatible with age-related atrophy. Periventricular and deep white matter hypodensities represent chronic microvascular ischemic changes. Left frontal old infarct and encephalomalacia. There is a small focal area of encephalomalacia changes in the anterior left temporal lobe, likely related to an old insult. There is no intracranial hemorrhage. No mass effect or midline shift identified.  The visualized paranasal sinuses and mastoid air cells are well aerated. A small focal lucency noted in the occiput. The calvarium is intact.  CT CERVICAL SPINE FINDINGS  There is no acute fracture or subluxation of the cervical spine.There is advanced osteopenia. Multilevel degenerative changes.The odontoid and spinous processes are intact.There is normal anatomic alignment of the C1-C2 lateral masses. The visualized soft tissues appear unremarkable.  IMPRESSION: No acute intracranial pathology.  Age-related atrophy and chronic microvascular ischemic disease. 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   Advanced osteopenia.  No acute/traumatic cervical spine pathology.   Electronically Signed   By: Anner Crete M.D.   On: 05/05/2015 03:58   Dg Hip Unilat With Pelvis 2-3 Views Left  05/05/2015   CLINICAL DATA:  81 year old female with fall and left hip pain.  EXAM: DG HIP (WITH OR WITHOUT PELVIS) 2-3V LEFT  COMPARISON:  None.  FINDINGS: There is severe osteopenia which limits evaluation of the study for fracture. There is a right total hip arthroplasty. There is a focal area of cortical discontinue the in the medial portion of the left femoral head, likely artifactual. A cortical fracture is less likely. Clinical correlation is recommended. No definite fracture identified. There is no dislocation on the provided images. CT may provide better evaluation for fracture if clinically indicated.  The soft tissues are grossly unremarkable.  IMPRESSION: Severe osteopenia.  No definite acute fracture or dislocation.  Apparent cortical discontinuity along the medial aspect of the left femoral head is most likely artifactual and less likely represents a cortical fracture. Clinical correlation is recommended.   Electronically Signed   By: Anner Crete M.D.   On: 05/05/2015 03:51      EKG Interpretation   Date/Time:  Saturday May 05 2015 02:06:50 EDT Ventricular Rate:  87 PR Interval:  118 QRS Duration: 108 QT Interval:  480 QTC Calculation: 577 R Axis:   57 Text Interpretation:  Sinus rhythm Ventricular bigeminy Borderline short  PR interval Low voltage, extremity leads Probable anteroseptal infarct,  old Abnormal T, consider ischemia, lateral leads Prolonged QT interval  Confirmed by Lita Mains  MD, DAVID ([id number]) on 05/05/2015 5:39:12 AM      MDM   Final diagnoses:  Hypokalemia  Hyponatremia  Fall, initial encounter   I personally performed the services described in this documentation, which was scribed in my presence. The recorded information has been reviewed and is  accurate.  Cortical irregularity seen on plain films. We'll get CT to rule out acute fracture. Discussed with Dr. Arnoldo Morale. Will admit to step down bed.  Spoke with Dr.Xu regarding left hip fracture. Asked to have the patient transferred to [facility].  Discussed with Dr. Sherrie Mustache. Will arrange transfer to [facility]. Dr Thereasa Solo will be accepting MD.    Julianne Rice, MD 05/05/15 [phone]

## 2015-05-05 NOTE — ED Notes (Signed)
[facility] here to transfer pt

## 2015-05-05 NOTE — ED Notes (Signed)
Critical Lab K = 2.7,  Reported by tech Judson Roch.  Will tell MD.

## 2015-05-05 NOTE — ED Notes (Signed)
Pt reports that she uses thickener for her liquids at [facility]. Hospitalist notified to place order

## 2015-05-05 NOTE — ED Notes (Signed)
[facility] called to receive report, currently en route

## 2015-05-05 NOTE — ED Notes (Signed)
Dr Coralyn Pear called and sts that he put the transfer order in. Was advised that he had to also place order for bed request, level of care and length of stay so bed placement could assign bed

## 2015-05-05 NOTE — ED Notes (Signed)
Pt resting quietly with even, unlabored respirations. VSS. Pt in NAD and will continue to monitor

## 2015-05-05 NOTE — ED Notes (Signed)
rn spoke with Dr Coralyn Pear, verified pt is to receive 7 runs of potassium.

## 2015-05-05 NOTE — ED Notes (Signed)
Have sent message via Amion to hospitalist to change meds to IV where possible

## 2015-05-05 NOTE — ED Notes (Signed)
The nurse that cares for pt at [facility] called and stated that pt normally ambulates in the assisted facility with a walker but has not left her room in the past week and that she wanted [place] to have the correct information.

## 2015-05-06 ENCOUNTER — Encounter (HOSPITAL_COMMUNITY): Payer: Self-pay | Admitting: *Deleted

## 2015-05-06 DIAGNOSIS — L8990 Pressure ulcer of unspecified site, unspecified stage: Secondary | ICD-10-CM | POA: Diagnosis present

## 2015-05-06 LAB — MAGNESIUM
MAGNESIUM: 2.9 mg/dL — AB (ref 1.7–2.4)
Magnesium: 1.5 mg/dL — ABNORMAL LOW (ref 1.7–2.4)

## 2015-05-06 LAB — BASIC METABOLIC PANEL
ANION GAP: 10 (ref 5–15)
Anion gap: 7 (ref 5–15)
BUN: <5 mg/dL — ABNORMAL LOW (ref 6–20)
BUN: 5 mg/dL — AB (ref 6–20)
CALCIUM: 8 mg/dL — AB (ref 8.9–10.3)
CHLORIDE: 89 mmol/L — AB (ref 101–111)
CO2: 33 mmol/L — ABNORMAL HIGH (ref 22–32)
CO2: 31 mmol/L (ref 22–32)
CREATININE: 0.53 mg/dL (ref 0.44–1.00)
Calcium: 7.8 mg/dL — ABNORMAL LOW (ref 8.9–10.3)
Chloride: 91 mmol/L — ABNORMAL LOW (ref 101–111)
Creatinine, Ser: 0.53 mg/dL (ref 0.44–1.00)
GFR calc Af Amer: >60 mL/min (ref >60)
GFR calc Af Amer: >60 mL/min (ref >60)
GLUCOSE: 78 mg/dL (ref 65–99)
GLUCOSE: 133 mg/dL — AB (ref 65–99)
POTASSIUM: 2.5 mmol/L — AB (ref 3.5–5.1)
Potassium: 4 mmol/L (ref 3.5–5.1)
SODIUM: 129 mmol/L — AB (ref 135–145)
Sodium: 132 mmol/L — ABNORMAL LOW (ref 135–145)

## 2015-05-06 LAB — GLUCOSE, CAPILLARY
GLUCOSE-CAPILLARY: 146 mg/dL — AB (ref 65–99)
Glucose-Capillary: 81 mg/dL (ref 65–99)
Glucose-Capillary: 71 mg/dL (ref 65–99)
Glucose-Capillary: 119 mg/dL — ABNORMAL HIGH (ref 65–99)

## 2015-05-06 LAB — PROTIME-INR
INR: 1.18 (ref 0.00–1.49)
Prothrombin Time: 15.2 seconds (ref 11.6–15.2)

## 2015-05-06 LAB — CBC
HEMATOCRIT: 32.3 % — AB (ref 36.0–46.0)
HEMOGLOBIN: 11.4 g/dL — AB (ref 12.0–15.0)
MCH: 31.8 pg (ref 26.0–34.0)
MCHC: 35.3 g/dL (ref 30.0–36.0)
MCV: 90 fL (ref 78.0–100.0)
Platelets: ADEQUATE 10*3/uL (ref 150–400)
RBC: 3.59 MIL/uL — ABNORMAL LOW (ref 3.87–5.11)
RDW: 13.3 % (ref 11.5–15.5)
WBC: 6.5 10*3/uL (ref 4.0–10.5)

## 2015-05-06 LAB — APTT: aPTT: 29 seconds (ref 24–37)

## 2015-05-06 MED ORDER — LEVOTHYROXINE SODIUM 100 MCG IV SOLR
25.0000 ug | Freq: Every day | INTRAVENOUS | Status: DC
  Administered 2015-05-06: 25 ug via INTRAVENOUS
  Filled 2015-05-06: qty 5

## 2015-05-06 MED ORDER — MORPHINE SULFATE (PF) 2 MG/ML IV SOLN
1.0000 mg | INTRAVENOUS | Status: DC | PRN
  Administered 2015-05-09: 2 mg via INTRAVENOUS
  Filled 2015-05-06: qty 1

## 2015-05-06 MED ORDER — SERTRALINE HCL 100 MG PO TABS
100.0000 mg | ORAL_TABLET | Freq: Every day | ORAL | Status: DC
  Administered 2015-05-06 – 2015-05-12 (×6): 100 mg via ORAL
  Filled 2015-05-06 (×7): qty 1

## 2015-05-06 MED ORDER — MAGNESIUM SULFATE 4 GM/100ML IV SOLN
4.0000 g | Freq: Once | INTRAVENOUS | Status: AC
  Administered 2015-05-06: 4 g via INTRAVENOUS
  Filled 2015-05-06: qty 100

## 2015-05-06 MED ORDER — KCL IN DEXTROSE-NACL 20-5-0.9 MEQ/L-%-% IV SOLN
INTRAVENOUS | Status: DC
  Administered 2015-05-06 – 2015-05-07 (×2): via INTRAVENOUS
  Filled 2015-05-06 (×3): qty 1000

## 2015-05-06 MED ORDER — POTASSIUM CHLORIDE 10 MEQ/100ML IV SOLN
10.0000 meq | INTRAVENOUS | Status: DC
  Administered 2015-05-06 (×3): 10 meq via INTRAVENOUS
  Filled 2015-05-06: qty 100

## 2015-05-06 MED ORDER — LEVOTHYROXINE SODIUM 50 MCG PO TABS
50.0000 ug | ORAL_TABLET | Freq: Every day | ORAL | Status: DC
  Administered 2015-05-07 – 2015-05-12 (×6): 50 ug via ORAL
  Filled 2015-05-06 (×7): qty 1

## 2015-05-06 MED ORDER — RESOURCE THICKENUP CLEAR PO POWD
ORAL | Status: DC | PRN
  Filled 2015-05-06: qty 125

## 2015-05-06 MED ORDER — POTASSIUM CHLORIDE 10 MEQ/100ML IV SOLN
10.0000 meq | INTRAVENOUS | Status: AC
  Administered 2015-05-06 (×4): 10 meq via INTRAVENOUS
  Filled 2015-05-06 (×4): qty 100

## 2015-05-06 MED ORDER — POTASSIUM CHLORIDE 10 MEQ/100ML IV SOLN: 10.0000 meq | INTRAVENOUS | Status: DC

## 2015-05-06 MED ORDER — MIRABEGRON ER 25 MG PO TB24
25.0000 mg | ORAL_TABLET | Freq: Every day | ORAL | Status: DC
  Administered 2015-05-06 – 2015-05-12 (×6): 25 mg via ORAL
  Filled 2015-05-06 (×7): qty 1

## 2015-05-06 MED ORDER — POTASSIUM CHLORIDE IN NACL 20-0.9 MEQ/L-% IV SOLN
INTRAVENOUS | Status: DC
  Filled 2015-05-06 (×2): qty 1000

## 2015-05-06 MED ORDER — DONEPEZIL HCL 5 MG PO TABS
5.0000 mg | ORAL_TABLET | Freq: Every day | ORAL | Status: DC
  Administered 2015-05-06 – 2015-05-11 (×5): 5 mg via ORAL
  Filled 2015-05-06 (×7): qty 1

## 2015-05-06 NOTE — Progress Notes (Signed)
Utilization Review Completed.Laura Benitez T9/18/2016  

## 2015-05-06 NOTE — Evaluation (Addendum)
Clinical/Bedside Swallow Evaluation Patient Details  Name: Laura Benitez MRN: [id number] Date of Birth: 1929-04-09  Today's Date: 05/06/2015 Time: SLP Start Time (ACUTE ONLY): 70 SLP Stop Time (ACUTE ONLY): 1523 SLP Time Calculation (min) (ACUTE ONLY): 18 min  Past Medical History:  Past Medical History  Diagnosis Date  . Anemia   . Depression   . Anxiety   . Fracture of lumbar spine     History in 2000  . Allergy   . Migraine headache   . GERD (gastroesophageal reflux disease)   . UTI (urinary tract infection)   . Otalgia, unspecified 10/14/2012  . Stage I pressure ulcer of back 10/14/2012  . Shortness of breath 10/14/2012  . 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 Chronic fatigue syndrome 02/09/2012  . Abnormality of gait 01/30/2012  . Senile osteoporosis 12/25/2011  . Congestive heart failure, unspecified 12/10/2011  . Acute bronchitis 12/04/2011  . Closed fracture of lateral malleolus 11/27/2011  . Edema 10/30/2011  . Peripheral vascular disease, unspecified 10/20/2011  . Urinary frequency 09/25/2011  . Throat pain 08/13/2011  . Candidiasis of other urogenital sites 07/21/2011  . Unspecified hypothyroidism 07/21/2011  . Personality change due to conditions classified elsewhere 07/21/2011  . Cracked tooth 07/21/2011  . Candidiasis of mouth 07/16/2011  . Neoplasm of uncertain behavior of skin 07/16/2011  . Lumbago 07/16/2011  . Dysphagia 07/16/2011  . Hyposmolality and/or hyponatremia 07/15/2011  . Acute posthemorrhagic anemia 07/15/2011  . Major depressive disorder, single episode, unspecified 07/15/2011  . Unspecified constipation 07/15/2011  . Pain in joint, pelvic region and thigh 07/15/2011  . Other closed fractures of distal end of radius (alone) 07/15/2011  . Closed fracture of distal end of ulna (alone) 07/15/2011  . Other orthopedic aftercare(V54.89) 07/15/2011  . CAD (coronary artery disease) 01/11/2015  . PAD (peripheral artery disease) 01/11/2015  . Alzheimer's disease  05/03/2015   Past Surgical History:  Past Surgical History  Procedure Laterality Date  . Tonsillectomy      @age  4  . Total abdominal hysterectomy    . Hip arthroplasty  07/10/2011    Procedure: ARTHROPLASTY BIPOLAR HIP;  Surgeon: Rozanna Box, MD;  Location: [facility];  Service: Orthopedics;  Laterality: Right;  . Orif wrist fracture  07/10/2011    Procedure: OPEN REDUCTION INTERNAL FIXATION (ORIF) WRIST FRACTURE;  Surgeon: Rozanna Box, MD;  Location: [facility];  Service: Orthopedics;  Laterality: Right;   HPI:  81 y.o. female with a history of Alzheimer's Disease, CAD, PAD, Hypothyroid, dysphagia (2012), GERD, acute bronchitis, who was found on the floor and sustained t SNF with left hip pain.Per MD note pt found to have multiple electrolyte abnormalities. X-rays of the revealed a left minimally displaced IT hip fx. Surgery possibly Monday. No CXR perormed. RN reports pt stated she uses thickener at SNF and was unable to swallow meds with thickened liquids and swallow assessment ordered.No ST documentation found.   Assessment / Plan / Recommendation Clinical Impression  Pt unable to recall viscocity of liquids at SNF; stated "they are pretty thick" and on "regular texture" diet. Pt tends to exhibit neck extention but achieves flexion when requested. Oral delays (mild) across consistencies noted. No wet vocal quality, cough or throat clear present with nectar thick or Dys 3 texture. Eyes reddened somewhat. Aspiration risk increased due to history of dysphagia and weakened condition now with hip fx. Recommend Dys 3 texture and nectar liquids, pills whole in applesauce, cup sips preferred. ST to follow pt acutely.  Aspiration Risk  Moderate    Diet Recommendation Dysphagia 3 (Mech soft);Nectar   Medication Administration: Whole meds with puree Compensations: Slow rate;Small sips/bites;Check for pocketing    Other  Recommendations Oral Care Recommendations: Oral care BID Other  Recommendations: Order thickener from pharmacy   Follow Up Recommendations       Frequency and Duration min 2x/week  2 weeks   Pertinent Vitals/Pain none    SLP Swallow Goals     Swallow Study Prior Functional Status       General Other Pertinent Information: 81 y.o. female with a history of Alzheimer's Disease, CAD, PAD, Hypothyroid, dysphagia (2012), GERD, acute bronchitis, who was found on the floor and sustained t SNF with left hip pain.Per MD note pt found to have multiple electrolyte abnormalities. X-rays of the revealed a left minimally displaced IT hip fx. Surgery possibly Monday. No CXR perormed. RN reports pt stated she uses thickener at SNF and was unable to swallow meds with thickened liquids and swallow assessment ordered.No ST documentation found. Type of Study: Bedside swallow evaluation Previous Swallow Assessment:  (none found in EPIC) Diet Prior to this Study:  (pt doesn't recall consistency of liquids and "regular food") Temperature Spikes Noted: No Respiratory Status: Supplemental O2 delivered via (comment) History of Recent Intubation: No Behavior/Cognition: Alert;Cooperative;Pleasant mood;Requires cueing Oral Cavity - Dentition: Adequate natural dentition/normal for age Self-Feeding Abilities: Able to feed self;Needs set up Patient Positioning: Upright in bed Baseline Vocal Quality: Normal Volitional Cough: Strong Volitional Swallow: Able to elicit    Oral/Motor/Sensory Function Overall Oral Motor/Sensory Function: Appears within functional limits for tasks assessed   Ice Chips Ice chips: Not tested   Thin Liquid Thin Liquid: Not tested (Pt stated on thick liquids at SNF)    Nectar Thick Nectar Thick Liquid: Impaired Presentation: Cup Oral Phase Impairments: Impaired anterior to posterior transit Oral phase functional implications: Prolonged oral transit Pharyngeal Phase Impairments:  (appeared functional)   Honey Thick Honey Thick Liquid: Not tested    Puree Puree: Impaired Presentation: Spoon Oral Phase Impairments: Impaired anterior to posterior transit Oral Phase Functional Implications: Prolonged oral transit Pharyngeal Phase Impairments:  (WFL)   Solid   GO    Solid: Impaired Oral Phase Impairments: Impaired anterior to posterior transit Oral Phase Functional Implications:  (mild delay)       Houston Siren 05/06/2015,3:38 PM   Orbie Pyo Colvin Caroli.Ed [employer] [phone]

## 2015-05-06 NOTE — Progress Notes (Signed)
At 2147  Pt's blood sugar was 69. Pt oriented x3, "shaky" 1/2 amp of d50 given. Rechecked blood sugar 30 minutes later and noted to be 146

## 2015-05-06 NOTE — Consult Note (Signed)
ORTHOPAEDIC CONSULTATION  REQUESTING PHYSICIAN: Cherene Altes, MD  Chief Complaint: Left hip fx  HPI: Laura Benitez is a 81 y.o. female who presents with left hip fx s/p unwitnessed fall at her SNF.  C/o severe left hip pain that is constant.  Details of her history are severely limited due to her severe alzheimer's.  CT scan showed left hip fx.  Patient was also hypotensive and had multiple electrolyte abnormalities in the ER.  Past Medical History  Diagnosis Date  . Anemia   . Depression   . Anxiety   . Fracture of lumbar spine     History in 2000  . Allergy   . Migraine headache   . GERD (gastroesophageal reflux disease)   . UTI (urinary tract infection)   . Otalgia, unspecified 10/14/2012  . Stage I pressure ulcer of back 10/14/2012  . Shortness of breath 10/14/2012  . 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 Chronic fatigue syndrome 02/09/2012  . Abnormality of gait 01/30/2012  . Senile osteoporosis 12/25/2011  . Congestive heart failure, unspecified 12/10/2011  . Acute bronchitis 12/04/2011  . Closed fracture of lateral malleolus 11/27/2011  . Edema 10/30/2011  . Peripheral vascular disease, unspecified 10/20/2011  . Urinary frequency 09/25/2011  . Throat pain 08/13/2011  . Candidiasis of other urogenital sites 07/21/2011  . Unspecified hypothyroidism 07/21/2011  . Personality change due to conditions classified elsewhere 07/21/2011  . Cracked tooth 07/21/2011  . Candidiasis of mouth 07/16/2011  . Neoplasm of uncertain behavior of skin 07/16/2011  . Lumbago 07/16/2011  . Dysphagia 07/16/2011  . Hyposmolality and/or hyponatremia 07/15/2011  . Acute posthemorrhagic anemia 07/15/2011  . Major depressive disorder, single episode, unspecified 07/15/2011  . Unspecified constipation 07/15/2011  . Pain in joint, pelvic region and thigh 07/15/2011  . Other closed fractures of distal end of radius (alone) 07/15/2011  . Closed fracture of distal end of ulna (alone) 07/15/2011  . Other  orthopedic aftercare(V54.89) 07/15/2011  . CAD (coronary artery disease) 01/11/2015  . PAD (peripheral artery disease) 01/11/2015  . Alzheimer's disease 05/03/2015   Past Surgical History  Procedure Laterality Date  . Tonsillectomy      @age  4  . Total abdominal hysterectomy    . Hip arthroplasty  07/10/2011    Procedure: ARTHROPLASTY BIPOLAR HIP;  Surgeon: Rozanna Box, MD;  Location: [facility];  Service: Orthopedics;  Laterality: Right;  . Orif wrist fracture  07/10/2011    Procedure: OPEN REDUCTION INTERNAL FIXATION (ORIF) WRIST FRACTURE;  Surgeon: Rozanna Box, MD;  Location: [facility];  Service: Orthopedics;  Laterality: Right;   Social History   Social History  . Marital Status: Single    Spouse Name: N/A  . Number of Children: 0  . Years of Education: N/A   Occupational History  . retired Pharmacist, hospital    Social History Main Topics  . Smoking status: Never Smoker   . Smokeless tobacco: Never Used  . Alcohol Use: No  . Drug Use: No  . Sexual Activity: Not Currently   Other Topics Concern  . None   Social History Narrative   Lives at [place]   Family History  Problem Relation Age of Onset  . Lung cancer Father   . Colon cancer Neg Hx    Allergies  Allergen Reactions  . Milk-Related Compounds Other (See Comments)    Milk products cause patient to have migraine headaches.  Pt can tolerate butter.   Prior to Admission medications   Medication  Sig Start Date End Date Taking? Authorizing Provider  ACTONEL 150 MG tablet Take 150 mg by mouth every 30 (thirty) days. Take  1 tablet once a month on the 29th of the month. 12/03/12  Yes Historical Provider, MD  alum & mag hydroxide-simeth (MINTOX) 200-200-20 MG/5ML suspension Take 30 mLs by mouth 3 (three) times daily. Take 30 ml three times daily as needed for indigestion.   Yes Historical Provider, MD  calcium-vitamin D (OSCAL WITH D) 500-200 MG-UNIT per tablet Take 1 tablet by mouth 2 (two) times daily.   Yes  Historical Provider, MD  fluticasone (FLONASE) 50 MCG/ACT nasal spray Place 2 sprays into the nose daily. Daily 12/23/12  Yes Historical Provider, MD  furosemide (LASIX) 40 MG tablet Take 40 mg by mouth daily. Take one daily 12/31/12  Yes Historical Provider, MD  levothyroxine (SYNTHROID, LEVOTHROID) 50 MCG tablet Take 50 mcg by mouth daily. Take 1 tablet by mouth daily.   Yes Historical Provider, MD  loperamide (IMODIUM) 2 MG capsule Take 2-4 mg by mouth 3 (three) times daily as needed for diarrhea or loose stools.   Yes Historical Provider, MD  LORazepam (ATIVAN) 1 MG tablet Take one tablet by mouth at bedtime for insomnia 12/26/14  Yes [name], NP  MYRBETRIQ 25 MG TB24 Take 25 mg by mouth daily. Take one tablet daily 12/22/12  Yes Historical Provider, MD  omeprazole (PRILOSEC) 40 MG capsule Take 40 mg by mouth daily.   Yes Historical Provider, MD  Polyethyl Glycol-Propyl Glycol (SYSTANE) 0.4-0.3 % SOLN Apply 1 drop to eye every 4 (four) hours as needed (for eye discomfort). Both eyes as needed for comfort   Yes Historical Provider, MD  protein supplement (RESOURCE BENEPROTEIN) POWD Take 1 scoop by mouth daily. MIX 1 SCOOP IN 4 OZ. OF HONEY THICK H20 AND TAKE BY MOUTH AT 2 PM.   Yes Historical Provider, MD  psyllium (METAMUCIL) 58.6 % powder Take 1 packet by mouth daily as needed (for constipation). Take teaspoonful mixed in 8oz fluide daily as needed   Yes Historical Provider, MD  sertraline (ZOLOFT) 100 MG tablet One daily to help depression and anxiety 03/13/15  Yes [name], MD  simethicone (MYLICON) 811 MG chewable tablet Chew 125 mg by mouth 3 (three) times daily as needed for flatulence.   Yes Historical Provider, MD  sodium chloride (DEEP SEA NASAL SPRAY) 0.65 % nasal spray Place 1 spray into the nose. Use 1 spray in each nostril as needed   Yes Historical Provider, MD  vitamin A 10000 UNIT capsule Take 10,000 Units by mouth daily.   Yes Historical Provider, MD  donepezil (ARICEPT)  5 MG tablet One nightly to help preserve memory 05/03/15   [name], MD   Ct Head Wo Contrast  05/05/2015   CLINICAL DATA:  81 year old female with fall  EXAM: CT HEAD WITHOUT CONTRAST  CT CERVICAL SPINE WITHOUT CONTRAST  TECHNIQUE: Multidetector CT imaging of the head and cervical spine was performed following the standard protocol without intravenous contrast. Multiplanar CT image reconstructions of the cervical spine were also generated.  COMPARISON:  None.  FINDINGS: CT HEAD FINDINGS  There is mild dilatation of the ventricles and sulci compatible with age-related atrophy. Periventricular and deep white matter hypodensities represent chronic microvascular ischemic changes. Left frontal old infarct and encephalomalacia. There is a small focal area of encephalomalacia changes in the anterior left temporal lobe, likely related to an old insult. There is no intracranial hemorrhage. No mass  effect or midline shift identified.  The visualized paranasal sinuses and mastoid air cells are well aerated. A small focal lucency noted in the occiput. The calvarium is intact.  CT CERVICAL SPINE FINDINGS  There is no acute fracture or subluxation of the cervical spine.There is advanced osteopenia. Multilevel degenerative changes.The odontoid and spinous processes are intact.There is normal anatomic alignment of the C1-C2 lateral masses. The visualized soft tissues appear unremarkable.  IMPRESSION: No acute intracranial pathology.  Age-related atrophy and chronic microvascular ischemic disease. 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  Advanced osteopenia.  No acute/traumatic cervical spine pathology.   Electronically Signed   By: Anner Crete M.D.   On: 05/05/2015 03:58   Ct Cervical Spine Wo Contrast  05/05/2015   CLINICAL DATA:  81 year old female with fall  EXAM: CT HEAD WITHOUT CONTRAST  CT CERVICAL SPINE WITHOUT CONTRAST  TECHNIQUE: Multidetector CT imaging of the head and cervical spine was  performed following the standard protocol without intravenous contrast. Multiplanar CT image reconstructions of the cervical spine were also generated.  COMPARISON:  None.  FINDINGS: CT HEAD FINDINGS  There is mild dilatation of the ventricles and sulci compatible with age-related atrophy. Periventricular and deep white matter hypodensities represent chronic microvascular ischemic changes. Left frontal old infarct and encephalomalacia. There is a small focal area of encephalomalacia changes in the anterior left temporal lobe, likely related to an old insult. There is no intracranial hemorrhage. No mass effect or midline shift identified.  The visualized paranasal sinuses and mastoid air cells are well aerated. A small focal lucency noted in the occiput. The calvarium is intact.  CT CERVICAL SPINE FINDINGS  There is no acute fracture or subluxation of the cervical spine.There is advanced osteopenia. Multilevel degenerative changes.The odontoid and spinous processes are intact.There is normal anatomic alignment of the C1-C2 lateral masses. The visualized soft tissues appear unremarkable.  IMPRESSION: No acute intracranial pathology.  Age-related atrophy and chronic microvascular ischemic disease. 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  Advanced osteopenia.  No acute/traumatic cervical spine pathology.   Electronically Signed   By: Anner Crete M.D.   On: 05/05/2015 03:58   Ct Pelvis Wo Contrast  05/05/2015   CLINICAL DATA:  Left hip pain after fall. Concern for left hip fracture.  EXAM: CT PELVIS WITHOUT CONTRAST  TECHNIQUE: Multidetector CT imaging of the pelvis was performed following the standard protocol without intravenous contrast.  COMPARISON:  Radiographs earlier this day.  FINDINGS: There is a minimally comminuted and minimally displaced fracture of the intertrochanteric left femur primarily involving the greater trochanter. No extension of the femoral neck. The femoral head is intact.  Severe osteoporosis. Right hip arthroplasty in place. Remote fracture of the right and left inferior pubic rami. Bony pelvis is otherwise intact. Urinary bladder is significantly distended.  IMPRESSION: Minimally comminuted and displaced intertrochanteric left hip fracture. The femoral head is intact.   Electronically Signed   By: Jeb Levering M.D.   On: 05/05/2015 06:25   Dg Hip Unilat With Pelvis 2-3 Views Left  05/05/2015   CLINICAL DATA:  81 year old female with fall and left hip pain.  EXAM: DG HIP (WITH OR WITHOUT PELVIS) 2-3V LEFT  COMPARISON:  None.  FINDINGS: There is severe osteopenia which limits evaluation of the study for fracture. There is a right total hip arthroplasty. There is a focal area of cortical discontinue the in the medial portion of the left femoral head, likely artifactual. A cortical fracture is less likely. Clinical correlation  is recommended. No definite fracture identified. There is no dislocation on the provided images. CT may provide better evaluation for fracture if clinically indicated.  The soft tissues are grossly unremarkable.  IMPRESSION: Severe osteopenia.  No definite acute fracture or dislocation.  Apparent cortical discontinuity along the medial aspect of the left femoral head is most likely artifactual and less likely represents a cortical fracture. Clinical correlation is recommended.   Electronically Signed   By: Anner Crete M.D.   On: 05/05/2015 03:51    Positive ROS: unable due to alzheimer's  Physical Exam: General: NAD Cardiovascular: No pedal edema Respiratory: No cyanosis, no use of accessory musculature GI: No organomegaly, abdomen is soft and non-tender Skin: No lesions in the area of chief complaint Neurologic: Sensation intact distally Psychiatric: Patient is severely demented Lymphatic: No axillary or cervical lymphadenopathy  MUSCULOSKELETAL:  - severe pain with movement of LLE - skin intact - compartments  soft  Assessment: Left minimally displaced IT hip fx  Plan: - will discuss treatment options and family's wishes with nephew today - patient continues to be hypokalemic despite multiple runs of K - please let me know when patient has been medically optimized for surgery - surgery postponed to Monday at the earliest  Thank you for the consult and the opportunity to see Ms. Ernst Bowler Eduard Roux, MD [place] 8:54 AM

## 2015-05-06 NOTE — Progress Notes (Signed)
[place] TEAM 1 - Stepdown/ICU TEAM PROGRESS NOTE  Laura Benitez NKN:[id number] DOB: 1929/01/26 DOA: 05/05/2015 PCP: [name], MD  Admit HPI / Brief Narrative: 81 y.o. female with a history of Alzheimer's Disease, CAD, PAD, Hypothyroidism, and Osteoporosis who was found on the floor in her room at the [facility] SNF. The fall was not witnessed. She had complaints of left hip pain. She was evaluated in the ED and found to have multiple electrolyte abnormalities, and imaging studies revealed a possible cortical fracture of the Left femoral head, so a CT scan of the hip was ordered. She was also hypotensive.   HPI/Subjective: The pt appears to be comfortable, but is lethargic and non-communicative.    Assessment/Plan:  Severe Hypokalemia Likely due to poor intake + lasix tx - replace Mg first - cont to supplement K+  Hypomagnesemia Replace via IV and follow - goal is 2.0  Hyponatremia Appears to be due to simple DH - cont to hydrate and follow   Hypotension - DH Cont volume resuscitation - stop Lasix   L hip fx s/p fall Ortho following - awaiting medical maximization prior to surgical correction   CAD Details of CAD not available presently - pt not able to provide a hx - poor quality EKG - repeat today - no evidence on exam of CHF - no M   PAD  Hypothyroid Cont synthroid  Alz disease Presently too sedate for oral meds - resume as soon as able   Thrombocytopenia   Code Status: NO CODE / DNR  Family Communication: no family present at time of exam Disposition Plan: SDU   Consultants: Ortho   Procedures: None   Antibiotics: None   DVT prophylaxis: lovenox   Objective: Blood pressure 100/65, pulse 77, temperature 98 F (36.7 C), temperature source Oral, resp. rate 17, height 5\' 5"  (1.651 m), weight 37 kg (81 lb 9.1 oz), SpO2 99 %.  Intake/Output Summary (Last 24 hours) at 05/06/15 1200 Last data filed at 05/06/15 0600  Gross per 24 hour    Intake 1082.5 ml  Output    300 ml  Net  782.5 ml   Exam: General: No acute respiratory distress - sedate Lungs: Clear to auscultation bilaterally without wheezes or crackles Cardiovascular: Regular rate and rhythm without murmur gallop or rub normal S1 and S2 Abdomen: Nontender, nondistended, soft, bowel sounds positive, no rebound, no ascites, no appreciable mass Extremities: No significant cyanosis, clubbing, or edema bilateral lower extremities  Data Reviewed: Basic Metabolic Panel:  Recent Labs Lab 05/05/15 0233 05/05/15 1300 05/05/15 1452 05/06/15 0239  NA 124* 127* 127* 132*  K <2.0* 2.7* 2.4* 2.5*  CL 72* 82* 80* 89*  CO2 39* 36*  --  33*  GLUCOSE 123* 83 80 78  BUN 12 8 6  <5*  CREATININE 0.65 0.44 0.60 0.53  CALCIUM 8.2* 7.7*  --  7.8*  MG 1.6*  --   --  1.5*    CBC:  Recent Labs Lab 05/05/15 0233 05/05/15 1452 05/06/15 0239  WBC 8.8  --  6.5  NEUTROABS 7.7  --   --   HGB 13.0 11.6* 11.4*  HCT 36.2 34.0* 32.3*  MCV 88.1  --  90.0  PLT 123*  --  PLATELET CLUMPS NOTED ON SMEAR, COUNT APPEARS ADEQUATE    Liver Function Tests:  Recent Labs Lab 05/05/15 0233  AST 39  ALT 22  ALKPHOS 78  BILITOT 1.2  PROT 5.8*  ALBUMIN 3.5  Coags: No results for input(s): INR in the last 168 hours.  Invalid input(s): PT No results for input(s): APTT in the last 168 hours.  Cardiac Enzymes: No results for input(s): CKTOTAL, CKMB, CKMBINDEX, TROPONINI in the last 168 hours.  CBG:  Recent Labs Lab 05/05/15 2147 05/05/15 2244 05/06/15 0729 05/06/15 1124  GLUCAP 69 142* 81 71    Recent Results (from the past 240 hour(s))  MRSA PCR Screening     Status: None   Collection Time: 05/05/15  7:13 PM  Result Value Ref Range Status   MRSA by PCR NEGATIVE NEGATIVE Final    Comment:        The GeneXpert MRSA Assay (FDA approved for NASAL specimens only), is one component of a comprehensive MRSA colonization surveillance program. It is not intended to  diagnose MRSA infection nor to guide or monitor treatment for MRSA infections.      Studies:   Recent x-ray studies have been reviewed in detail by the Attending Physician  Scheduled Meds:  Scheduled Meds: . calcium-vitamin D  1 tablet Oral BID  . donepezil  5 mg Oral QHS  . enoxaparin (LOVENOX) injection  30 mg Subcutaneous Q24H  . levothyroxine  50 mcg Oral QAC breakfast  . LORazepam  1 mg Oral QHS  . mirabegron ER  25 mg Oral Daily  . pantoprazole  40 mg Oral Daily  . potassium chloride  40 mEq Oral Once  . protein supplement  1 scoop Oral Daily  . sertraline  100 mg Oral Daily  . vitamin A  10,000 Units Oral Daily    Time spent on care of this patient: 35 mins   MCCLUNG,JEFFREY T , MD   [facility] Office  [phone] Pager - Text Page per Shea Evans as per below:  On-Call/Text Page:      [email or web address]      password [email or web address]  If 7PM-7AM, please contact night-coverage [email or web address] Password [email or web address] 05/06/2015, 12:00 PM   LOS: 1 day

## 2015-05-07 LAB — BASIC METABOLIC PANEL
Anion gap: 4 — ABNORMAL LOW (ref 5–15)
CO2: 31 mmol/L (ref 22–32)
CREATININE: 0.53 mg/dL (ref 0.44–1.00)
Calcium: 7.9 mg/dL — ABNORMAL LOW (ref 8.9–10.3)
Chloride: 97 mmol/L — ABNORMAL LOW (ref 101–111)
GFR calc Af Amer: >60 mL/min (ref >60)
GLUCOSE: 115 mg/dL — AB (ref 65–99)
POTASSIUM: 3.7 mmol/L (ref 3.5–5.1)
Sodium: 132 mmol/L — ABNORMAL LOW (ref 135–145)

## 2015-05-07 LAB — MAGNESIUM: MAGNESIUM: 2.3 mg/dL (ref 1.7–2.4)

## 2015-05-07 LAB — GLUCOSE, CAPILLARY
Glucose-Capillary: 101 mg/dL — ABNORMAL HIGH (ref 65–99)
Glucose-Capillary: 112 mg/dL — ABNORMAL HIGH (ref 65–99)

## 2015-05-07 MED ORDER — POTASSIUM CHLORIDE IN NACL 20-0.9 MEQ/L-% IV SOLN
INTRAVENOUS | Status: DC
  Administered 2015-05-07 – 2015-05-10 (×5): via INTRAVENOUS
  Filled 2015-05-07 (×6): qty 1000

## 2015-05-07 NOTE — Clinical Social Work Note (Signed)
Clinical Social Work Assessment  Patient Details  Name: Laura Benitez MRN: [id number] Date of Birth: 18-Mar-1929  Date of referral:  05/06/15               Reason for consult:  Facility Placement ([facility], but will return to [facility] SNF)                Permission sought to share information with:  Family Supports Permission granted to share information::     Name::     Romie Minus Muson  Relationship::  Friend    Housing/Transportation Living arrangements for the past 2 months:  [place] of Information:  Adult Children Patient Interpreter Needed:  None Criminal Activity/Legal Involvement Pertinent to Current Situation/Hospitalization:   No  Significant Relationships:  Friend Lives with:  Facility Resident Do you feel safe going back to the place where you live?  No Need for family participation in patient care:  No (Coment)  Care giving concerns:  N/A   Facilities manager / plan:  CSW met with the pt's friend Romie Minus at the bedside. CSW introduced self and purpose of the visit. Romie Minus confirmed that the pt is an ALF resident at [facility], but will return to the SNF section. CSW answered all questions in which the Romie Minus inquired about. CSW will continue to follow this pt and assist with discharge as needed.   Employment status:  Retired Nurse, adult PT Recommendations:  Not assessed at this time Information / Referral to community resources:   (N/A)  Patient/Family's Response to care:  Romie Minus reported that the care in which the pt has received as been well.   Patient/Family's Understanding of and Emotional Response to Diagnosis, Current Treatment, and Prognosis: Romie Minus could not provided great detail about this admission, but shared knowing the pt has been ill. Romie Minus agreed that the pt will return back to [facility].   Emotional Assessment Appearance:  Appears stated  age Attitude/Demeanor/Rapport:  Unable to Assess Affect (typically observed):  Unable to Assess Orientation:  Oriented to Self Alcohol / Substance use:  Not Applicable Psych involvement (Current and /or in the community):  No (Comment)  Discharge Needs  Concerns to be addressed:  Denies Needs/Concerns at this time Readmission within the last 30 days:  No Current discharge risk:  None Barriers to Discharge:  No Barriers Identified   Quintell Bonnin, LCSW 05/07/2015, 12:19 PM

## 2015-05-07 NOTE — Progress Notes (Signed)
Spoke to [facility] regarding surgery.  They have agreed to proceed with surgery on Wednesday.  They would like Tuesday to speak with the patient once more about her wishes.  We will plan to do the surgery around 3:30 pm Wednesday.    Azucena Cecil, MD [place] 8:38 PM

## 2015-05-07 NOTE — Progress Notes (Signed)
[place] TEAM 1 - Stepdown/ICU TEAM PROGRESS NOTE  Laura Benitez IPJ:[id number] DOB: 02/14/1929 DOA: 05/05/2015 PCP: [name], MD  Admit HPI / Brief Narrative: 81 y.o. female with a history of Alzheimer's Disease, CAD, PAD, Hypothyroidism, and Osteoporosis who was found on the floor in her [facility]. The fall was not witnessed. She had complaints of left hip pain. She was evaluated in the ED and found to have multiple electrolyte abnormalities, and imaging studies revealed a possible cortical fracture of the L femoral head, so a CT scan of the hip was ordered. She was also hypotensive.   HPI/Subjective: The patient is much more alert today.  She is able to answer simple questions.  She does not appear to be uncomfortable.  She cannot tell me where she is or why she is here.  She denies significant pain shortness of breath nausea vomiting or chest discomfort.  Assessment/Plan:  Severe Hypokalemia Likely due to poor intake + lasix tx - replaced within a normal range at present   Hypomagnesemia Replacedvia IV - now normalized   Hyponatremia Appears to be due to simple DH - approaching normal w/ simple volume resuscitation - now within safe range to allow surgery   Hypotension - DH Cont volume resuscitation - BP appears to be stable - review of SNF notes suggests recent baseline BP is ~100/60 therefore she is at/near her baseline   L hip fx s/p fall Ortho following - awaiting family consent prior to surgical correction - pt is now medically maximized if family chooses to proceed w/ OR   CAD Details of CAD not available presently - pt not able to provide a hx - poor quality admit EKG - repeat EKG w/o acute findings - no evidence on exam of CHF - no murmer   PAD  Hypothyroid Cont synthroid  Alz disease Resume usual oral meds    Thrombocytopenia  Mild to moderate - should not prove to be a problem   Code Status: NO CODE / DNR  Family  Communication: no family present at time of exam Disposition Plan: SDU   Consultants: Ortho   Procedures: None   Antibiotics: None   DVT prophylaxis: lovenox   Objective: Blood pressure 90/56, pulse 57, temperature 97.3 F (36.3 C), temperature source Oral, resp. rate 14, height 5\' 5"  (1.651 m), weight 37 kg (81 lb 9.1 oz), SpO2 100 %.  Intake/Output Summary (Last 24 hours) at 05/07/15 1054 Last data filed at 05/07/15 0700  Gross per 24 hour  Intake   1675 ml  Output    100 ml  Net   1575 ml   Exam: General: No acute respiratory distress - alert and conversant  Lungs: Clear to auscultation bilaterally without wheezes / crackles Cardiovascular: Regular rate and rhythm without murmur gallop or rub Abdomen: Nontender, nondistended, soft, bowel sounds positive, no rebound, no ascites, no appreciable mass Extremities: No significant cyanosis, clubbing, edema bilateral lower extremities  Data Reviewed: Basic Metabolic Panel:  Recent Labs Lab 05/05/15 0233 05/05/15 1300 05/05/15 1452 05/06/15 0239 05/06/15 1921 05/07/15 0225  NA 124* 127* 127* 132* 129* 132*  K <2.0* 2.7* 2.4* 2.5* 4.0 3.7  CL 72* 82* 80* 89* 91* 97*  CO2 39* 36*  --  33* 31 31  GLUCOSE 123* 83 80 78 133* 115*  BUN 12 8 6  <5* 5* <5*  CREATININE 0.65 0.44 0.60 0.53 0.53 0.53  CALCIUM 8.2* 7.7*  --  7.8* 8.0* 7.9*  MG 1.6*  --   --  1.5* 2.9* 2.3    CBC:  Recent Labs Lab 05/05/15 0233 05/05/15 1452 05/06/15 0239  WBC 8.8  --  6.5  NEUTROABS 7.7  --   --   HGB 13.0 11.6* 11.4*  HCT 36.2 34.0* 32.3*  MCV 88.1  --  90.0  PLT 123*  --  PLATELET CLUMPS NOTED ON SMEAR, COUNT APPEARS ADEQUATE    Liver Function Tests:  Recent Labs Lab 05/05/15 0233  AST 39  ALT 22  ALKPHOS 78  BILITOT 1.2  PROT 5.8*  ALBUMIN 3.5    Coags:  Recent Labs Lab 05/06/15 1921  INR 1.18    Recent Labs Lab 05/06/15 1921  APTT 29    CBG:  Recent Labs Lab 05/06/15 0729 05/06/15 1124  05/06/15 1607 05/06/15 2114 05/07/15 0735  GLUCAP 81 71 119* 146* 101*    Recent Results (from the past 240 hour(s))  MRSA PCR Screening     Status: None   Collection Time: 05/05/15  7:13 PM  Result Value Ref Range Status   MRSA by PCR NEGATIVE NEGATIVE Final    Comment:        The GeneXpert MRSA Assay (FDA approved for NASAL specimens only), is one component of a comprehensive MRSA colonization surveillance program. It is not intended to diagnose MRSA infection nor to guide or monitor treatment for MRSA infections.      Studies:   Recent x-ray studies have been reviewed in detail by the Attending Physician  Scheduled Meds:  Scheduled Meds: . donepezil  5 mg Oral QHS  . enoxaparin (LOVENOX) injection  30 mg Subcutaneous Q24H  . levothyroxine  50 mcg Oral QAC breakfast  . mirabegron ER  25 mg Oral Daily  . sertraline  100 mg Oral Daily    Time spent on care of this patient: 35 mins   MCCLUNG,JEFFREY T , MD   [facility] Office  [phone] Pager - Text Page per Shea Evans as per below:  On-Call/Text Page:      [email or web address]      password [email or web address]  If 7PM-7AM, please contact night-coverage [email or web address] Password [email or web address] 05/07/2015, 10:54 AM   LOS: 2 days

## 2015-05-07 NOTE — Progress Notes (Signed)
I spoke with patient's nephew who is HCPOA regarding patient's condition and recommendation for surgery.  He will discuss with his family and friends about their wishes for the patient.  He will contact the nursing unit with their decision.  For now, we'll continue to medically maximize her for possible surgery.  Please call me with any updates.  Azucena Cecil, MD [place] 8:14 AM

## 2015-05-07 NOTE — Progress Notes (Signed)
Patient nephew and POA, Herbie Baltimore, called and spoke to this RN. Per Herbie Baltimore, would like to speak to Ortho MD in reference to the possible surgery to discuss risks. Agreeable to proceed with surgery if patient is medically stable. Per Herbie Baltimore, best time to call is after 12 noon as he lives in [place], can call cell number at any time after that.

## 2015-05-07 NOTE — Consult Note (Signed)
WOC wound consult note Reason for Consult: pressure injury Wound type: Stage 2 pressure injury left buttock Stage 2 pressure injury x 2 along spine  Pressure Ulcer POA: Yes Measurement: Spinal x 2 0.5cm x 0.5cm x 0.1cm  Left buttock: 0.5cm x 0.5cm x 0.2cm  Wound bed: all three areas partial thickness, pink, moist Drainage (amount, consistency, odor) minimal, serosanguinous  Periwound: intact Bruising noted on the left hip, assume from fall  Dressing procedure/placement/frequency: Continue silicone foam to protect areas and promote moist wound healing, change every 3 days and PRN soilage.   Discussed POC bedside nurse.  Re consult if needed, will not follow at this time. Thanks  Melody Kellogg, [place] [phone])

## 2015-05-07 NOTE — Progress Notes (Signed)
Initial Nutrition Assessment  DOCUMENTATION CODES:   Severe malnutrition in context of chronic illness, Underweight  INTERVENTION:    Pudding TID between meals to maximize oral intake  NUTRITION DIAGNOSIS:   Malnutrition related to chronic illness as evidenced by severe depletion of body fat, severe depletion of muscle mass.  GOAL:   Patient will meet greater than or equal to 90% of their needs  MONITOR:   PO intake, Supplement acceptance, Labs, Weight trends, Skin  REASON FOR ASSESSMENT:   Malnutrition Screening Tool    ASSESSMENT:   81 y.o. female with a history of Alzheimer's Disease, CAD, PAD, Hypothyroid, and Osteoporosis who was found on the floor in her room at the [facility]. The fall was not witnessed. She had complaints of left hip pain. She was evaluated in the ED and found tohave multiple electrolyte abnormalities, and imaging studies were performed and X-rays of the Left Hip revealed a possible cortical fracture of the Left femoral head.  Nutrition-Focused physical exam completed. Findings are severe fat depletion, severe muscle depletion, and moderate edema. Patient reports usual good intake, ? accuracy of info she provided given hx of Alzheimer's DZ. She consumed 75% of breakfast today. She c/o pain, RN notified.   Diet Order:  DIET DYS 3 Room service appropriate?: Yes; Fluid consistency:: Nectar Thick  Skin:  Wound (see comment) (stage 3 pressure ulcer to back, stage 2 pressure ulcer to buttocks)  Last BM:  9/18  Height:   Ht Readings from Last 1 Encounters:  05/05/15 5\' 5"  (1.651 m)    Weight:   Wt Readings from Last 1 Encounters:  05/05/15 81 lb 9.1 oz (37 kg)    Ideal Body Weight:  56.8 kg  BMI:  Body mass index is 13.57 kg/(m^2).  Estimated Nutritional Needs:   Kcal:  1200  Protein:  55 gm  Fluid:  1.5 L  EDUCATION NEEDS:   No education needs identified at this time  Molli Barrows, [place], [place], [place] Pager  [phone] After Hours Pager [phone]

## 2015-05-08 DIAGNOSIS — W19XXXA Unspecified fall, initial encounter: Secondary | ICD-10-CM

## 2015-05-08 DIAGNOSIS — D696 Thrombocytopenia, unspecified: Secondary | ICD-10-CM

## 2015-05-08 DIAGNOSIS — G309 Alzheimer's disease, unspecified: Secondary | ICD-10-CM

## 2015-05-08 DIAGNOSIS — E038 Other specified hypothyroidism: Secondary | ICD-10-CM

## 2015-05-08 DIAGNOSIS — E871 Hypo-osmolality and hyponatremia: Secondary | ICD-10-CM

## 2015-05-08 DIAGNOSIS — E876 Hypokalemia: Secondary | ICD-10-CM

## 2015-05-08 DIAGNOSIS — I2510 Atherosclerotic heart disease of native coronary artery without angina pectoris: Secondary | ICD-10-CM | POA: Diagnosis present

## 2015-05-08 DIAGNOSIS — I739 Peripheral vascular disease, unspecified: Secondary | ICD-10-CM

## 2015-05-08 DIAGNOSIS — I9589 Other hypotension: Secondary | ICD-10-CM

## 2015-05-08 DIAGNOSIS — S72002A Fracture of unspecified part of neck of left femur, initial encounter for closed fracture: Secondary | ICD-10-CM

## 2015-05-08 LAB — COMPREHENSIVE METABOLIC PANEL
ALBUMIN: 2.7 g/dL — AB (ref 3.5–5.0)
ALT: 15 U/L (ref 14–54)
AST: 24 U/L (ref 15–41)
Alkaline Phosphatase: 68 U/L (ref 38–126)
Anion gap: 4 — ABNORMAL LOW (ref 5–15)
BILIRUBIN TOTAL: 1.1 mg/dL (ref 0.3–1.2)
CHLORIDE: 98 mmol/L — AB (ref 101–111)
CO2: 28 mmol/L (ref 22–32)
CREATININE: 0.46 mg/dL (ref 0.44–1.00)
Calcium: 7.9 mg/dL — ABNORMAL LOW (ref 8.9–10.3)
GFR calc Af Amer: >60 mL/min (ref >60)
GFR calc non Af Amer: >60 mL/min (ref >60)
GLUCOSE: 95 mg/dL (ref 65–99)
POTASSIUM: 4.2 mmol/L (ref 3.5–5.1)
Sodium: 130 mmol/L — ABNORMAL LOW (ref 135–145)
Total Protein: 4.8 g/dL — ABNORMAL LOW (ref 6.5–8.1)

## 2015-05-08 LAB — MAGNESIUM: Magnesium: 1.9 mg/dL (ref 1.7–2.4)

## 2015-05-08 LAB — GLUCOSE, CAPILLARY: Glucose-Capillary: 94 mg/dL (ref 65–99)

## 2015-05-08 LAB — CBC
HEMATOCRIT: 35.5 % — AB (ref 36.0–46.0)
HEMOGLOBIN: 12.4 g/dL (ref 12.0–15.0)
MCH: 32 pg (ref 26.0–34.0)
MCHC: 34.9 g/dL (ref 30.0–36.0)
MCV: 91.5 fL (ref 78.0–100.0)
Platelets: 127 10*3/uL — ABNORMAL LOW (ref 150–400)
RBC: 3.88 MIL/uL (ref 3.87–5.11)
RDW: 13.8 % (ref 11.5–15.5)
WBC: 6.2 10*3/uL (ref 4.0–10.5)

## 2015-05-08 LAB — C DIFFICILE QUICK SCREEN W PCR REFLEX
C Diff antigen: NEGATIVE
C Diff interpretation: NEGATIVE
C Diff toxin: NEGATIVE

## 2015-05-08 NOTE — Progress Notes (Signed)
Speech Language Pathology Treatment: Dysphagia  Patient Details Name: Laura Benitez MRN: [id number] DOB: [birth year] Today's Date: 05/08/2015 Time: 2035-5974 SLP Time Calculation (min) (ACUTE ONLY): 10 min  Assessment / Plan / Recommendation Clinical Impression  During dysphagia treatment, pt demonstrated mild oral and oral pharyngeal dysphagia characterized by multiple swallows with no s/sx of aspiration. Pt refused po after two sips of nectar; pt was paranoid and uncooperative. SLP provided education and rationale to encourage safe po intake. SLP will f/u after hip surgery to check for continued diet tolerance.    HPI Other Pertinent Information: 81 y.o. female with a history of Alzheimer's Disease, CAD, PAD, Hypothyroid, dysphagia (2012), GERD, acute bronchitis, who was found on the floor and sustained t SNF with left hip pain.Per MD note pt found to have multiple electrolyte abnormalities. X-rays of the revealed a left minimally displaced IT hip fx. Surgery possibly Monday. No CXR perormed. RN reports pt stated she uses thickener at SNF and was unable to swallow meds with thickened liquids and swallow assessment ordered.No ST documentation found.   Pertinent Vitals Pain Assessment: Faces Faces Pain Scale: No hurt  SLP Plan  Continue with current plan of care    Recommendations Diet recommendations: Nectar-thick liquid;Dysphagia 3 (mechanical soft) Liquids provided via: Cup;No straw Medication Administration: Whole meds with puree Supervision: Staff to assist with self feeding Compensations: Slow rate;Small sips/bites;Check for pocketing Postural Changes and/or Swallow Maneuvers: Seated upright 90 degrees              Oral Care Recommendations: Oral care BID Plan: Continue with current plan of care    [place], Student-SLP  Lanier Ensign 05/08/2015, 11:40 AM

## 2015-05-08 NOTE — Progress Notes (Signed)
[place] TEAM 1 - Stepdown/ICU TEAM Progress Note  Laura Benitez GEX:[id number] DOB: 02/19/1929 DOA: 05/05/2015 PCP: [name], MD  Admit HPI / Brief Narrative: 81 y.o. WF PMHx Depression, Anxiety, major depressive disorder Alzheimer's Disease, CAD, PVD PAD, CHF Hypothyroidism, and Osteoporosis   Found on the floor in her room atthe [facility]. The fall was not witnessed. She had complaints of left hip pain. She was evaluated in the ED and found to have multiple electrolyte abnormalities, and imaging studies revealed a possible cortical fracture of the L femoral head, so a CT scan of the hip was ordered. She was also hypotensive.    HPI/Subjective: 9/20 A/O x 1(does not know where,when,why), Pleasant follows all commands   Assessment/Plan: L hip fx s/p fall -Ortho following - per note most likely surgical correction in the a.m. -If POA does not agree to surgical intervention consult palliative care for palliative/hospice placement.   Severe Hypokalemia -Resolved continue to monitor  Hypomagnesemia -Resolved continue to monitor    Hyponatremia -Stable within safe range.  Hypotension - DH -Resolved with hydration  -Continue normal saline 78ml/hr  CAD native artery -Details of CAD not available presently - pt not able to provide a hx - poor quality admit EKG - repeat EKG w/o acute findings - no evidence on exam of CHF - no murmer  -Echocardiogram pending  Hypothyroid -Cont synthroid 50 g daily  Alz disease -Continue Aricept 5 mg daily  -Continue Myrbetriq 25 mg daily    Thrombocytopenia  -Mild to moderate - should not prove to be a problem    Code Status: FULL Family Communication: no family present at time of exam Disposition Plan: Repair of hip or palliative/hospice care    Consultants: Orthopedics  Procedure/Significant Events:    Culture   Antibiotics:   DVT prophylaxis: Lovenox   Devices    LINES / TUBES:       Continuous Infusions: . 0.9 % NaCl with KCl 20 mEq / L 75 mL/hr at 05/08/15 1805    Objective: VITAL SIGNS: Temp: 98.7 F (37.1 C) (09/20 1933) Temp Source: Oral (09/20 1933) BP: 140/88 mmHg (09/20 1910) Pulse Rate: 87 (09/20 1602) SPO2; FIO2:   Intake/Output Summary (Last 24 hours) at 05/08/15 2211 Last data filed at 05/08/15 1805  Gross per 24 hour  Intake 2356.25 ml  Output      0 ml  Net 2356.25 ml     Exam: General: A/O x 1(does not know where,when,why),No acute respiratory distress Eyes: Negative headache, eye pain, double vision,negative scleral hemorrhage ENT: Negative Runny nose, negative ear pain, negative gingival bleeding, Neck:  Negative scars, masses, torticollis, lymphadenopathy, JVD Lungs: Clear to auscultation bilaterally without wheezes or crackles Cardiovascular: Regular rate and rhythm without murmur gallop or rub normal S1 and S2 Abdomen:negative abdominal pain, nondistended, positive soft, bowel sounds, no rebound, no ascites, no appreciable mass Extremities: No significant cyanosis, clubbing, or edema bilateral lower extremities Psychiatric:  Pleasantly demented  Neurologic:  Cranial nerves II through XII intact, tongue/uvula midline, all extremities muscle strength 5/5, sensation intact throughout,, negative dysarthria, negative expressive aphasia, negative receptive aphasia.   Data Reviewed: Basic Metabolic Panel:  Recent Labs Lab 05/05/15 0233 05/05/15 1300 05/05/15 1452 05/06/15 0239 05/06/15 1921 05/07/15 0225 05/08/15 0319  NA 124* 127* 127* 132* 129* 132* 130*  K <2.0* 2.7* 2.4* 2.5* 4.0 3.7 4.2  CL 72* 82* 80* 89* 91* 97* 98*  CO2 39* 36*  --  33* 31 31 28  GLUCOSE 123* 83 80 78 133* 115* 95  BUN 12 8 6  <5* 5* <5* <5*  CREATININE 0.65 0.44 0.60 0.53 0.53 0.53 0.46  CALCIUM 8.2* 7.7*  --  7.8* 8.0* 7.9* 7.9*  MG 1.6*  --   --  1.5* 2.9* 2.3 1.9   Liver Function Tests:  Recent Labs Lab 05/05/15 0233 05/08/15 0319   AST 39 24  ALT 22 15  ALKPHOS 78 68  BILITOT 1.2 1.1  PROT 5.8* 4.8*  ALBUMIN 3.5 2.7*   No results for input(s): LIPASE, AMYLASE in the last 168 hours. No results for input(s): AMMONIA in the last 168 hours. CBC:  Recent Labs Lab 05/05/15 0233 05/05/15 1452 05/06/15 0239 05/08/15 0319  WBC 8.8  --  6.5 6.2  NEUTROABS 7.7  --   --   --   HGB 13.0 11.6* 11.4* 12.4  HCT 36.2 34.0* 32.3* 35.5*  MCV 88.1  --  90.0 91.5  PLT 123*  --  PLATELET CLUMPS NOTED ON SMEAR, COUNT APPEARS ADEQUATE 127*   Cardiac Enzymes: No results for input(s): CKTOTAL, CKMB, CKMBINDEX, TROPONINI in the last 168 hours. BNP (last 3 results) No results for input(s): BNP in the last 8760 hours.  ProBNP (last 3 results) No results for input(s): PROBNP in the last 8760 hours.  CBG:  Recent Labs Lab 05/06/15 1124 05/06/15 1607 05/06/15 2114 05/07/15 0735 05/07/15 2122  GLUCAP 71 119* 146* 101* 112*    Recent Results (from the past 240 hour(s))  MRSA PCR Screening     Status: None   Collection Time: 05/05/15  7:13 PM  Result Value Ref Range Status   MRSA by PCR NEGATIVE NEGATIVE Final    Comment:        The GeneXpert MRSA Assay (FDA approved for NASAL specimens only), is one component of a comprehensive MRSA colonization surveillance program. It is not intended to diagnose MRSA infection nor to guide or monitor treatment for MRSA infections.   C difficile quick scan w PCR reflex     Status: None   Collection Time: 05/08/15  5:45 AM  Result Value Ref Range Status   C Diff antigen NEGATIVE NEGATIVE Final   C Diff toxin NEGATIVE NEGATIVE Final   C Diff interpretation Negative for toxigenic C. difficile  Final     Studies:  Recent x-ray studies have been reviewed in detail by the Attending Physician  Scheduled Meds:  Scheduled Meds: . donepezil  5 mg Oral QHS  . levothyroxine  50 mcg Oral QAC breakfast  . mirabegron ER  25 mg Oral Daily  . sertraline  100 mg Oral Daily     Time spent on care of this patient: 40 mins   WOODS, Geraldo Docker , MD  [facility] Office  [phone] Pager [phone]  On-Call/Text Page:      [email or web address]      password [email or web address]  If 7PM-7AM, please contact night-coverage [email or web address] Password [email or web address] 05/08/2015, 10:11 PM   LOS: 3 days   Care during the described time interval was provided by me .  I have reviewed this patient's available data, including medical history, events of note, physical examination, and all test results as part of my evaluation. I have personally reviewed and interpreted all radiology studies.   Dia Crawford, MD [phone] Pager

## 2015-05-08 NOTE — Care Management Note (Addendum)
Case Management Note  Patient Details  Name: Laura Benitez MRN: [id number] Date of Birth: [birth year]  Subjective/Objective:             Admitted with a history of Alzheimer's Disease, CAD, PAD, Hypothyroid, and Osteoporosis who was found on the floor in her room at the [facility] SNF. Brought to the ED and found to have multiple electrolyte abnormalities, fracture of the Left femoral head and hypotensive.       Action/Plan: OR   05/08/18  To repair L hip fx Return to SNF when medically stable. CM to f/u with disposition needs.   Expected Discharge Date:                  Expected Discharge Plan:  Skilled Nursing Facility  In-House Referral:  Clinical Social Work  Discharge planning Services  CM Consult  Post Acute Care Choice:    Choice offered to:     DME Arranged:    DME Agency:     HH Arranged:    [place] Agency:     Status of Service:  In process, will continue to follow  Medicare Important Message Given:    Date Medicare IM Given:    Medicare IM give by:    Date Additional Medicare IM Given:    Additional Medicare Important Message give by:     If discussed at [place] of Stay Meetings, dates discussed:  05/10/15  Additional Comments:    S/P INTRAMEDULLARY (IM) NAIL FEMORAL (Left) 05/09/15    Whitman Hero [place], [place] [phone] 05/08/2015, 11:23 AM

## 2015-05-08 NOTE — Care Management Important Message (Signed)
Important Message  Patient Details  Name: Laura Benitez MRN: [id number] Date of Birth: [birth year]   Medicare Important Message Given:  Yes-second notification given    [name] May 05/08/2015, 11:34 AM

## 2015-05-09 ENCOUNTER — Inpatient Hospital Stay (HOSPITAL_COMMUNITY): Payer: Medicare Other | Admitting: Anesthesiology

## 2015-05-09 ENCOUNTER — Encounter (HOSPITAL_COMMUNITY)
Admission: EM | Disposition: A | Discharge status: Skilled Nursing Facility | Payer: Self-pay | Source: Home / Self Care | Attending: Internal Medicine

## 2015-05-09 ENCOUNTER — Inpatient Hospital Stay (HOSPITAL_COMMUNITY): Payer: Medicare Other

## 2015-05-09 DIAGNOSIS — Z01810 Encounter for preprocedural cardiovascular examination: Secondary | ICD-10-CM

## 2015-05-09 DIAGNOSIS — E43 Unspecified severe protein-calorie malnutrition: Secondary | ICD-10-CM | POA: Diagnosis present

## 2015-05-09 HISTORY — PX: FEMUR IM NAIL: SHX1597

## 2015-05-09 SURGERY — INSERTION, INTRAMEDULLARY ROD, FEMUR
Anesthesia: General | Laterality: Left

## 2015-05-09 MED ORDER — PROPOFOL 10 MG/ML IV BOLUS
INTRAVENOUS | Status: DC | PRN
  Administered 2015-05-09: 50 mg via INTRAVENOUS

## 2015-05-09 MED ORDER — ALUM & MAG HYDROXIDE-SIMETH 200-200-20 MG/5ML PO SUSP: 30.0000 mL | ORAL | Status: DC | PRN

## 2015-05-09 MED ORDER — ESMOLOL BOLUS VIA INFUSION
INTRAVENOUS | Status: DC | PRN
  Administered 2015-05-09: 40 mg via INTRAVENOUS

## 2015-05-09 MED ORDER — ONDANSETRON HCL 4 MG PO TABS: 4.0000 mg | ORAL_TABLET | Freq: Four times a day (QID) | ORAL | Status: DC | PRN

## 2015-05-09 MED ORDER — CEFAZOLIN SODIUM-DEXTROSE 2-3 GM-% IV SOLR
INTRAVENOUS | Status: DC | PRN
  Administered 2015-05-09: 1 g via INTRAVENOUS

## 2015-05-09 MED ORDER — PHENOL 1.4 % MT LIQD: 1.0000 | OROMUCOSAL | Status: DC | PRN

## 2015-05-09 MED ORDER — HYDROCODONE-ACETAMINOPHEN 5-325 MG PO TABS: 1.0000 | ORAL_TABLET | Freq: Four times a day (QID) | ORAL | Status: DC | PRN

## 2015-05-09 MED ORDER — ROCURONIUM BROMIDE 100 MG/10ML IV SOLN
INTRAVENOUS | Status: DC | PRN
  Administered 2015-05-09: 25 mg via INTRAVENOUS

## 2015-05-09 MED ORDER — METHOCARBAMOL 500 MG PO TABS
500.0000 mg | ORAL_TABLET | Freq: Four times a day (QID) | ORAL | Status: DC | PRN
  Filled 2015-05-09: qty 1

## 2015-05-09 MED ORDER — SUCCINYLCHOLINE CHLORIDE 20 MG/ML IJ SOLN
INTRAMUSCULAR | Status: DC | PRN
  Administered 2015-05-09: 60 mg via INTRAVENOUS

## 2015-05-09 MED ORDER — LACTATED RINGERS IV SOLN
INTRAVENOUS | Status: DC | PRN
  Administered 2015-05-09: 16:00:00 via INTRAVENOUS

## 2015-05-09 MED ORDER — CETYLPYRIDINIUM CHLORIDE 0.05 % MT LIQD
7.0000 mL | Freq: Two times a day (BID) | OROMUCOSAL | Status: DC
  Administered 2015-05-09 – 2015-05-11 (×5): 7 mL via OROMUCOSAL

## 2015-05-09 MED ORDER — MORPHINE SULFATE (PF) 2 MG/ML IV SOLN: 0.5000 mg | INTRAVENOUS | Status: DC | PRN

## 2015-05-09 MED ORDER — ROCURONIUM BROMIDE 50 MG/5ML IV SOLN
INTRAVENOUS | Status: AC
  Filled 2015-05-09: qty 1

## 2015-05-09 MED ORDER — MENTHOL 3 MG MT LOZG
1.0000 | LOZENGE | OROMUCOSAL | Status: DC | PRN
  Filled 2015-05-09: qty 9

## 2015-05-09 MED ORDER — ONDANSETRON HCL 4 MG/2ML IJ SOLN
INTRAMUSCULAR | Status: AC
  Filled 2015-05-09: qty 2

## 2015-05-09 MED ORDER — CEFAZOLIN SODIUM-DEXTROSE 2-3 GM-% IV SOLR
INTRAVENOUS | Status: AC
  Filled 2015-05-09: qty 50

## 2015-05-09 MED ORDER — METHOCARBAMOL 1000 MG/10ML IJ SOLN
500.0000 mg | Freq: Four times a day (QID) | INTRAVENOUS | Status: DC | PRN
  Filled 2015-05-09: qty 5

## 2015-05-09 MED ORDER — FENTANYL CITRATE (PF) 250 MCG/5ML IJ SOLN
INTRAMUSCULAR | Status: AC
  Filled 2015-05-09: qty 5

## 2015-05-09 MED ORDER — FENTANYL CITRATE (PF) 250 MCG/5ML IJ SOLN
INTRAMUSCULAR | Status: DC | PRN
Start: 2015-05-09 — End: 2015-05-09
  Administered 2015-05-09: 25 ug via INTRAVENOUS

## 2015-05-09 MED ORDER — ONDANSETRON HCL 4 MG/2ML IJ SOLN: 4.0000 mg | Freq: Four times a day (QID) | INTRAMUSCULAR | Status: DC | PRN

## 2015-05-09 MED ORDER — GLYCOPYRROLATE 0.2 MG/ML IJ SOLN
INTRAMUSCULAR | Status: AC
  Filled 2015-05-09: qty 1

## 2015-05-09 MED ORDER — SUGAMMADEX SODIUM 200 MG/2ML IV SOLN
INTRAVENOUS | Status: AC
  Filled 2015-05-09: qty 2

## 2015-05-09 MED ORDER — SODIUM CHLORIDE 0.9 % IV SOLN: INTRAVENOUS | Status: DC

## 2015-05-09 MED ORDER — 0.9 % SODIUM CHLORIDE (POUR BTL) OPTIME
TOPICAL | Status: DC | PRN
  Administered 2015-05-09: 1000 mL

## 2015-05-09 MED ORDER — FENTANYL CITRATE (PF) 100 MCG/2ML IJ SOLN: 25.0000 ug | INTRAMUSCULAR | Status: DC | PRN

## 2015-05-09 MED ORDER — ENOXAPARIN SODIUM 30 MG/0.3ML ~~LOC~~ SOLN: 30.0000 mg | SUBCUTANEOUS | Status: AC

## 2015-05-09 MED ORDER — ACETAMINOPHEN 650 MG RE SUPP: 650.0000 mg | Freq: Four times a day (QID) | RECTAL | Status: DC | PRN

## 2015-05-09 MED ORDER — PHENYLEPHRINE HCL 10 MG/ML IJ SOLN
INTRAMUSCULAR | Status: DC | PRN
  Administered 2015-05-09: 80 ug via INTRAVENOUS
  Administered 2015-05-09: 120 ug via INTRAVENOUS
  Administered 2015-05-09: 80 ug via INTRAVENOUS
  Administered 2015-05-09: 120 ug via INTRAVENOUS

## 2015-05-09 MED ORDER — SUGAMMADEX SODIUM 200 MG/2ML IV SOLN
INTRAVENOUS | Status: DC | PRN
  Administered 2015-05-09: 200 mg via INTRAVENOUS

## 2015-05-09 MED ORDER — HYDROCODONE-ACETAMINOPHEN 7.5-325 MG PO TABS: 1.0000 | ORAL_TABLET | Freq: Four times a day (QID) | ORAL | Status: AC | PRN

## 2015-05-09 MED ORDER — OXYCODONE HCL 5 MG PO TABS: 5.0000 mg | ORAL_TABLET | ORAL | Status: DC | PRN

## 2015-05-09 MED ORDER — GLYCOPYRROLATE 0.2 MG/ML IJ SOLN
INTRAMUSCULAR | Status: DC | PRN
  Administered 2015-05-09: 0.1 mg via INTRAVENOUS

## 2015-05-09 MED ORDER — ENOXAPARIN SODIUM 40 MG/0.4ML ~~LOC~~ SOLN
40.0000 mg | SUBCUTANEOUS | Status: DC
  Administered 2015-05-10: 40 mg via SUBCUTANEOUS
  Filled 2015-05-09 (×3): qty 0.4

## 2015-05-09 MED ORDER — PROPOFOL 10 MG/ML IV BOLUS
INTRAVENOUS | Status: AC
  Filled 2015-05-09: qty 20

## 2015-05-09 MED ORDER — METOCLOPRAMIDE HCL 5 MG/ML IJ SOLN
5.0000 mg | Freq: Three times a day (TID) | INTRAMUSCULAR | Status: DC | PRN
  Filled 2015-05-09: qty 2

## 2015-05-09 MED ORDER — METOCLOPRAMIDE HCL 5 MG PO TABS
5.0000 mg | ORAL_TABLET | Freq: Three times a day (TID) | ORAL | Status: DC | PRN
  Filled 2015-05-09: qty 2

## 2015-05-09 MED ORDER — ACETAMINOPHEN 325 MG PO TABS: 650.0000 mg | ORAL_TABLET | Freq: Four times a day (QID) | ORAL | Status: DC | PRN

## 2015-05-09 MED ORDER — CEFAZOLIN SODIUM-DEXTROSE 2-3 GM-% IV SOLR
2.0000 g | Freq: Four times a day (QID) | INTRAVENOUS | Status: AC
  Administered 2015-05-09 – 2015-05-10 (×3): 2 g via INTRAVENOUS
  Filled 2015-05-09 (×4): qty 50

## 2015-05-09 SURGICAL SUPPLY — 40 items
BNDG COHESIVE 4X5 TAN STRL (GAUZE/BANDAGES/DRESSINGS) ×3 IMPLANT
COVER PERINEAL POST (MISCELLANEOUS) ×3 IMPLANT
COVER SURGICAL LIGHT HANDLE (MISCELLANEOUS) ×3 IMPLANT
DRAPE C-ARM 42X72 X-RAY (DRAPES) IMPLANT
DRAPE C-ARMOR (DRAPES) IMPLANT
DRAPE INCISE IOBAN 66X45 STRL (DRAPES) IMPLANT
DRAPE ORTHO SPLIT 77X108 STRL (DRAPES)
DRAPE PROXIMA HALF (DRAPES) IMPLANT
DRAPE SURG ORHT 6 SPLT 77X108 (DRAPES) IMPLANT
DRAPE U-SHAPE 47X51 STRL (DRAPES) IMPLANT
DRSG EMULSION OIL 3X3 NADH (GAUZE/BANDAGES/DRESSINGS) IMPLANT
DRSG MEPILEX BORDER 4X4 (GAUZE/BANDAGES/DRESSINGS) ×6 IMPLANT
DRSG MEPILEX BORDER 4X8 (GAUZE/BANDAGES/DRESSINGS) ×3 IMPLANT
DURAPREP 26ML APPLICATOR (WOUND CARE) ×3 IMPLANT
ELECT CAUTERY BLADE 6.4 (BLADE) ×3 IMPLANT
ELECT REM PT RETURN 9FT ADLT (ELECTROSURGICAL) ×3
ELECTRODE REM PT RTRN 9FT ADLT (ELECTROSURGICAL) ×1 IMPLANT
FACESHIELD WRAPAROUND (MASK) ×6 IMPLANT
GLOVE NEODERM STRL 7.5 LF PF (GLOVE) ×2 IMPLANT
GLOVE SURG NEODERM 7.5  LF PF (GLOVE) ×4
GOWN STRL REIN XL XLG (GOWN DISPOSABLE) ×12 IMPLANT
GUIDE PIN 3.2X343 (PIN) ×1
GUIDE PIN 3.2X343MM (PIN) ×2
KIT BASIN OR (CUSTOM PROCEDURE TRAY) ×3 IMPLANT
KIT ROOM TURNOVER OR (KITS) ×3 IMPLANT
LINER BOOT UNIVERSAL DISP (MISCELLANEOUS) ×3 IMPLANT
MANIFOLD NEPTUNE II (INSTRUMENTS) ×3 IMPLANT
NAIL TRIGEN 10MMX40CM-125 LEFT (Nail) ×3 IMPLANT
NS IRRIG 1000ML POUR BTL (IV SOLUTION) ×3 IMPLANT
PACK GENERAL/GYN (CUSTOM PROCEDURE TRAY) ×3 IMPLANT
PAD ARMBOARD 7.5X6 YLW CONV (MISCELLANEOUS) ×6 IMPLANT
PIN GUIDE 3.2X343MM (PIN) ×1 IMPLANT
SCREW LAG COMPR KIT 90/85 (Screw) ×3 IMPLANT
STAPLER VISISTAT 35W (STAPLE) IMPLANT
STOCKINETTE IMPERVIOUS 9X36 MD (GAUZE/BANDAGES/DRESSINGS) IMPLANT
SUT VIC AB 0 CT1 27 (SUTURE) ×2
SUT VIC AB 0 CT1 27XBRD ANBCTR (SUTURE) ×1 IMPLANT
SUT VIC AB 2-0 CT1 27 (SUTURE) ×4
SUT VIC AB 2-0 CT1 TAPERPNT 27 (SUTURE) ×2 IMPLANT
WATER STERILE IRR 1000ML POUR (IV SOLUTION) ×6 IMPLANT

## 2015-05-09 NOTE — H&P (Signed)

## 2015-05-09 NOTE — Progress Notes (Signed)
[place] TEAM 1 - Stepdown/ICU TEAM PROGRESS NOTE  Laura Benitez LTJ:[id number] DOB: 31-May-1929 DOA: 05/05/2015 PCP: [name], MD  Admit HPI / Brief Narrative: 81 y.o. female with a history of Alzheimer's Disease, CAD, PAD, Hypothyroidism, and Osteoporosis who was found on the floor in her room atthe [facility]. The fall was not witnessed. She had complaints of left hip pain. She was evaluated in the ED and found to have multiple electrolyte abnormalities, and imaging studies revealed a possible cortical fracture of the L femoral head, so a CT scan of the hip was ordered. She was also hypotensive.   HPI/Subjective: The pt is seen in the PACU.  She is resting comfortably and in no apparent distress.  She remains a bit lethargic post anesthesia, but follows simple commands reliably.    Assessment/Plan:  Severe Hypokalemia likely due to poor intake + lasix tx - replaced and now normal    Hypomagnesemia now normalized   Hyponatremia Appears to be due to simple DH - approaching normal w/ simple volume resuscitation - follow trend   Hypotension - DH BP now stable - review of SNF notes suggests recent baseline BP is ~100/60 therefore she is at/near her baseline   L hip fx s/p fall Ortho following - to OR today    CAD Details of CAD not available presently - pt not able to provide a hx - poor quality admit EKG - repeat EKG w/o acute findings - no evidence on exam of CHF - no murmer - EF 65-70% on TTE w/ no WMA but w/ grade 1 DD and modest pulm HTN (9mm Hg)  PAD  Hypothyroid Cont synthroid  Alz disease Cont usual oral meds    Thrombocytopenia  Mild to moderate - should not prove to be a problem   Code Status: NO CODE / DNR  Family Communication: no family present at time of exam as pt seen in PACU  Disposition Plan: SDU   Consultants: Ortho   Procedures: None   Antibiotics: None   DVT prophylaxis: lovenox   Objective: Blood pressure 111/75,  pulse 75, temperature 97.6 F (36.4 C), temperature source Oral, resp. rate 15, height 5\' 5"  (1.651 m), weight 37 kg (81 lb 9.1 oz), SpO2 95 %.  Intake/Output Summary (Last 24 hours) at 05/09/15 1504 Last data filed at 05/09/15 1406  Gross per 24 hour  Intake 2857.5 ml  Output      0 ml  Net 2857.5 ml   Exam: General: No acute respiratory distress  Lungs: Clear to auscultation bilaterally  Cardiovascular: Regular rate and rhythm without murmur gallop rub Abdomen: Nontender, nondistended, soft, bowel sounds positive, no rebound, no ascites, no appreciable mass Extremities: No significant cyanosis, clubbing, or edema bilateral lower extremities  Data Reviewed: Basic Metabolic Panel:  Recent Labs Lab 05/05/15 0233 05/05/15 1300 05/05/15 1452 05/06/15 0239 05/06/15 1921 05/07/15 0225 05/08/15 0319  NA 124* 127* 127* 132* 129* 132* 130*  K <2.0* 2.7* 2.4* 2.5* 4.0 3.7 4.2  CL 72* 82* 80* 89* 91* 97* 98*  CO2 39* 36*  --  33* 31 31 28   GLUCOSE 123* 83 80 78 133* 115* 95  BUN 12 8 6  <5* 5* <5* <5*  CREATININE 0.65 0.44 0.60 0.53 0.53 0.53 0.46  CALCIUM 8.2* 7.7*  --  7.8* 8.0* 7.9* 7.9*  MG 1.6*  --   --  1.5* 2.9* 2.3 1.9    CBC:  Recent Labs Lab 05/05/15 0233 05/05/15 1452  05/06/15 0239 05/08/15 0319  WBC 8.8  --  6.5 6.2  NEUTROABS 7.7  --   --   --   HGB 13.0 11.6* 11.4* 12.4  HCT 36.2 34.0* 32.3* 35.5*  MCV 88.1  --  90.0 91.5  PLT 123*  --  PLATELET CLUMPS NOTED ON SMEAR, COUNT APPEARS ADEQUATE 127*    Liver Function Tests:  Recent Labs Lab 05/05/15 0233 05/08/15 0319  AST 39 24  ALT 22 15  ALKPHOS 78 68  BILITOT 1.2 1.1  PROT 5.8* 4.8*  ALBUMIN 3.5 2.7*    Coags:  Recent Labs Lab 05/06/15 1921  INR 1.18    Recent Labs Lab 05/06/15 1921  APTT 29    CBG:  Recent Labs Lab 05/06/15 1607 05/06/15 2114 05/07/15 0735 05/07/15 2122 05/08/15 2252  GLUCAP 119* 146* 101* 112* 94    Recent Results (from the past 240 hour(s))  MRSA  PCR Screening     Status: None   Collection Time: 05/05/15  7:13 PM  Result Value Ref Range Status   MRSA by PCR NEGATIVE NEGATIVE Final    Comment:        The GeneXpert MRSA Assay (FDA approved for NASAL specimens only), is one component of a comprehensive MRSA colonization surveillance program. It is not intended to diagnose MRSA infection nor to guide or monitor treatment for MRSA infections.   C difficile quick scan w PCR reflex     Status: None   Collection Time: 05/08/15  5:45 AM  Result Value Ref Range Status   C Diff antigen NEGATIVE NEGATIVE Final   C Diff toxin NEGATIVE NEGATIVE Final   C Diff interpretation Negative for toxigenic C. difficile  Final     Studies:   Recent x-ray studies have been reviewed in detail by the Attending Physician  Scheduled Meds:  Scheduled Meds: . donepezil  5 mg Oral QHS  . levothyroxine  50 mcg Oral QAC breakfast  . mirabegron ER  25 mg Oral Daily  . sertraline  100 mg Oral Daily    Time spent on care of this patient: 25 mins   [facility] T , MD   [facility] Office  [phone] Pager - Text Page per Shea Evans as per below:  On-Call/Text Page:      [email or web address]      password [email or web address]  If 7PM-7AM, please contact night-coverage [email or web address] Password [email or web address] 05/09/2015, 3:04 PM   LOS: 4 days

## 2015-05-09 NOTE — Anesthesia Postprocedure Evaluation (Signed)
  Anesthesia Post-op Note  Patient: Laura Benitez  Procedure(s) Performed: Procedure(s) (LRB): INTRAMEDULLARY (IM) NAIL FEMORAL (Left)  Patient Location: PACU  Anesthesia Type: General  Level of Consciousness: awake and alert   Airway and Oxygen Therapy: Patient Spontanous Breathing  Post-op Pain: mild  Post-op Assessment: Post-op Vital signs reviewed, Patient's Cardiovascular Status Stable, Respiratory Function Stable, Patent Airway and No signs of Nausea or vomiting  Last Vitals:  Filed Vitals:   05/09/15 1745  BP: 121/80  Pulse: 89  Temp:   Resp: 21    Post-op Vital Signs: stable   Complications: No apparent anesthesia complications

## 2015-05-09 NOTE — Progress Notes (Signed)
  Echocardiogram 2D Echocardiogram has been performed.  Donata Clay 05/09/2015, 10:36 AM

## 2015-05-09 NOTE — Discharge Instructions (Signed)
° ° °  1. Change dressings as needed °2. May shower but keep incisions covered and dry °3. Take lovenox to prevent blood clots °4. Take stool softeners as needed °5. Take pain meds as needed ° °

## 2015-05-09 NOTE — Op Note (Signed)
   Date of Surgery: 05/09/2015  INDICATIONS: [name] is a 81 y.o.-year-old female who sustained a left hip fracture. The risks and benefits of the procedure discussed with the family prior to the procedure and all questions were answered; consent was obtained.  PREOPERATIVE DIAGNOSIS: left hip fracture   POSTOPERATIVE DIAGNOSIS: Same   PROCEDURE: Treatment of intertrochanteric, pertrochanteric, subtrochanteric fracture with intramedullary implant. CPT [phone]   SURGEON: N. Eduard Roux, M.D.   ANESTHESIA: general   IV FLUIDS AND URINE: See anesthesia record   ESTIMATED BLOOD LOSS: 200 cc  IMPLANTS: [employer] InterTAN 10 x 40, 90/85  DRAINS: None.   COMPLICATIONS: None.   DESCRIPTION OF PROCEDURE: The patient was brought to the operating room and placed supine on the operating table. The patient's leg had been signed prior to the procedure. The patient had the anesthesia placed by the anesthesiologist. The prep verification and incision time-outs were performed to confirm that this was the correct patient, site, side and location. The patient had an SCD on the opposite lower extremity. The patient did receive antibiotics prior to the incision and was re-dosed during the procedure as needed at indicated intervals. The patient was positioned on the fracture table with the table in traction and internal rotation to reduce the hip. The well leg was placed in a scissor position and all bony prominences were well-padded. The patient had the lower extremity prepped and draped in the standard surgical fashion. The incision was made 4 finger breadths superior to the greater trochanter. A guide pin was inserted into the tip of the greater trochanter under fluoroscopic guidance. An opening reamer was used to gain access to the femoral canal. The nail length was measured and inserted down the femoral canal to its proper depth. The appropriate version of insertion for the lag screw was found under  fluoroscopy. A pin was inserted up the femoral neck through the jig. Then, a second antirotation pin was inserted inferior to the first pin. The length of the lag screw was then measured. The lag screw was inserted as near to center-center in the head as possible. The antirotation pin was then taken out and an interdigitating compression screw was placed in its place. The leg was taken out of traction, then the interdigitating compression screw was used to compress across the fracture. Compression was visualized on serial xrays. The wound was copiously irrigated with saline and the subcutaneous layer closed with 2.0 vicryl and the skin was reapproximated with staples. The wounds were cleaned and dried a final time and a sterile dressing was placed. The hip was taken through a range of motion at the end of the case under fluoroscopic imaging to visualize the approach-withdraw phenomenon and confirm implant length in the head. The patient was then awakened from anesthesia and taken to the recovery room in stable condition. All counts were correct at the end of the case.   POSTOPERATIVE PLAN: The patient will be weight bearing as tolerated and will return in 2 weeks for staple removal and the patient will receive DVT prophylaxis based on other medications, activity level, and risk ratio of bleeding to thrombosis.   Azucena Cecil, MD [facility] [phone] 5:18 PM

## 2015-05-09 NOTE — Transfer of Care (Signed)
Immediate Anesthesia Transfer of Care Note  Patient: Laura Benitez  Procedure(s) Performed: Procedure(s): INTRAMEDULLARY (IM) NAIL FEMORAL (Left)  Patient Location: PACU  Anesthesia Type:General  Level of Consciousness: awake  Airway & Oxygen Therapy: Patient Spontanous Breathing and Patient connected to face mask oxygen  Post-op Assessment: Report given to RN and Post -op Vital signs reviewed and stable  Post vital signs: Reviewed and stable  Last Vitals:  Filed Vitals:   05/09/15 1120  BP: 111/75  Pulse: 75  Temp: 36.4 C  Resp: 15    Complications: No apparent anesthesia complications

## 2015-05-09 NOTE — Clinical Social Work Note (Signed)
CSW reviewed chart. Once pt is medically stable she will transition to [facility]. CSW will continue to follow and assistant with discharge needs.   Dysheka Bibbs, MSW, LCSWA [phone] 

## 2015-05-09 NOTE — Anesthesia Preprocedure Evaluation (Addendum)
Anesthesia Evaluation  Patient identified by MRN, date of birth, ID band Patient confused    Reviewed: Allergy & Precautions, NPO status , Patient's Chart, lab work & pertinent test results  Airway Mallampati: III  TM Distance: >3 FB Neck ROM: Full    Dental  (+) Dental Advisory Given   Pulmonary    breath sounds clear to auscultation       Cardiovascular + CAD and + Peripheral Vascular Disease   Rhythm:Regular Rate:Normal  EF 60%. Mild MR. Mod TR   Neuro/Psych Alzheimers dz    GI/Hepatic Neg liver ROS, GERD  ,  Endo/Other  Hypothyroidism   Renal/GU negative Renal ROS     Musculoskeletal   Abdominal   Peds  Hematology Plts 127,000   Anesthesia Other Findings   Reproductive/Obstetrics                           Anesthesia Physical Anesthesia Plan  ASA: III  Anesthesia Plan: General   Post-op Pain Management:    Induction: Intravenous  Airway Management Planned: Oral ETT  Additional Equipment:   Intra-op Plan:   Post-operative Plan: Extubation in OR  Informed Consent: I have reviewed the patients History and Physical, chart, labs and discussed the procedure including the risks, benefits and alternatives for the proposed anesthesia with the patient or authorized representative who has indicated his/her understanding and acceptance.     Plan Discussed with: CRNA  Anesthesia Plan Comments:         Anesthesia Quick Evaluation

## 2015-05-09 NOTE — Anesthesia Procedure Notes (Signed)
Procedure Name: Intubation Date/Time: 05/09/2015 4:33 PM Performed by: Collier Bullock Pre-anesthesia Checklist: Patient identified, Emergency Drugs available, Suction available, Patient being monitored and Timeout performed Patient Re-evaluated:Patient Re-evaluated prior to inductionOxygen Delivery Method: Circle system utilized Preoxygenation: Pre-oxygenation with 100% oxygen Intubation Type: IV induction Laryngoscope Size: Mac and 3 Grade View: Grade II Tube type: Oral Tube size: 6.5 mm Number of attempts: 1 Airway Equipment and Method: Stylet Placement Confirmation: ETT inserted through vocal cords under direct vision,  positive ETCO2 and breath sounds checked- equal and bilateral Secured at: 20 cm Tube secured with: Tape Dental Injury: Teeth and Oropharynx as per pre-operative assessment and Injury to lip

## 2015-05-10 ENCOUNTER — Encounter (HOSPITAL_COMMUNITY): Payer: Self-pay | Admitting: Orthopaedic Surgery

## 2015-05-10 DIAGNOSIS — I270 Primary pulmonary hypertension: Secondary | ICD-10-CM

## 2015-05-10 DIAGNOSIS — I071 Rheumatic tricuspid insufficiency: Secondary | ICD-10-CM

## 2015-05-10 DIAGNOSIS — E43 Unspecified severe protein-calorie malnutrition: Secondary | ICD-10-CM

## 2015-05-10 DIAGNOSIS — I272 Pulmonary hypertension, unspecified: Secondary | ICD-10-CM | POA: Diagnosis present

## 2015-05-10 LAB — BASIC METABOLIC PANEL
Anion gap: 8 (ref 5–15)
BUN: 5 mg/dL — AB (ref 6–20)
CALCIUM: 8.5 mg/dL — AB (ref 8.9–10.3)
CO2: 23 mmol/L (ref 22–32)
CREATININE: 0.61 mg/dL (ref 0.44–1.00)
Chloride: 99 mmol/L — ABNORMAL LOW (ref 101–111)
GFR calc Af Amer: >60 mL/min (ref >60)
GFR calc non Af Amer: >60 mL/min (ref >60)
GLUCOSE: 99 mg/dL (ref 65–99)
Potassium: 5.7 mmol/L — ABNORMAL HIGH (ref 3.5–5.1)
Sodium: 130 mmol/L — ABNORMAL LOW (ref 135–145)

## 2015-05-10 LAB — CBC
HEMATOCRIT: 36.1 % (ref 36.0–46.0)
Hemoglobin: 12.2 g/dL (ref 12.0–15.0)
MCH: 30.7 pg (ref 26.0–34.0)
MCHC: 33.8 g/dL (ref 30.0–36.0)
MCV: 90.9 fL (ref 78.0–100.0)
Platelets: 157 10*3/uL (ref 150–400)
RBC: 3.97 MIL/uL (ref 3.87–5.11)
RDW: 14 % (ref 11.5–15.5)
WBC: 13.3 10*3/uL — ABNORMAL HIGH (ref 4.0–10.5)

## 2015-05-10 MED ORDER — ENOXAPARIN SODIUM 30 MG/0.3ML ~~LOC~~ SOLN
30.0000 mg | SUBCUTANEOUS | Status: DC
  Administered 2015-05-11 – 2015-05-12 (×2): 30 mg via SUBCUTANEOUS
  Filled 2015-05-10 (×3): qty 0.3

## 2015-05-10 MED ORDER — DEXTROSE-NACL 5-0.9 % IV SOLN
INTRAVENOUS | Status: DC
  Administered 2015-05-10: 22:00:00 via INTRAVENOUS

## 2015-05-10 NOTE — Progress Notes (Signed)
Nutrition Follow-up  DOCUMENTATION CODES:   Severe malnutrition in context of chronic illness, Underweight  INTERVENTION:    Continue pudding TID between meals to maximize oral intake.  NUTRITION DIAGNOSIS:   Malnutrition related to chronic illness as evidenced by severe depletion of body fat, severe depletion of muscle mass.  Ongoing  GOAL:   Patient will meet greater than or equal to 90% of their needs  Progressing   MONITOR:   PO intake, Supplement acceptance, Labs, Weight trends, Skin  REASON FOR ASSESSMENT:   Malnutrition Screening Tool    ASSESSMENT:   81 y.o. female with a history of Alzheimer's Disease, CAD, PAD, Hypothyroid, and Osteoporosis who was found on the floor in her room at the [facility] SNF. The fall was not witnessed. She had complaints of left hip pain. She was evaluated in the ED and found tohave multiple electrolyte abnormalities, and imaging studies were performed and X-rays of the Left Hip revealed a possible cortical fracture of the Left femoral head.  S/P intramedullary implant for hip fx on 9/21. Patient continues to receive a dysphagia 3 diet with nectar thick liquids. Consuming 25-75% of meals over the past week. Also receiving Pudding TID between meals.   Diet Order:  DIET DYS 3 Room service appropriate?: Yes; Fluid consistency:: Nectar Thick  Skin:  Wound (see comment) (stage 3 pressure ulcer to back, stage 2 pressure ulcer to buttocks)  Last BM:  9/21  Height:   Ht Readings from Last 1 Encounters:  05/05/15 5\' 5"  (1.651 m)    Weight:   Wt Readings from Last 1 Encounters:  05/05/15 81 lb 9.1 oz (37 kg)    Ideal Body Weight:  56.8 kg  BMI:  Body mass index is 13.57 kg/(m^2).  Estimated Nutritional Needs:   Kcal:  1200  Protein:  55 gm  Fluid:  1.5 L  EDUCATION NEEDS:   No education needs identified at this time  Molli Barrows, [place], [place], [place] Pager [phone] After Hours Pager [phone]

## 2015-05-10 NOTE — Progress Notes (Signed)
[place] TEAM 1 - Stepdown/ICU TEAM Progress Note  Laura Benitez QAS:[id number] DOB: 16-Jun-1929 DOA: 05/05/2015 PCP: [name], MD  Admit HPI / Brief Narrative: 81 y.o. WF PMHx Depression, Anxiety, major depressive disorder Alzheimer's Disease, CAD, PVD PAD, CHF Hypothyroidism, and Osteoporosis   [facility]. The fall was not witnessed. She had complaints of left hip pain. She was evaluated in the ED and found to have multiple electrolyte abnormalities, and imaging studies revealed a possible cortical fracture of the L femoral head, so a CT scan of the hip was ordered. She was also hypotensive.    HPI/Subjective: 9/22 A/O x 1(does not know where,when,why), however patient does know she is not hospital, and that it's 2016. Sitting in chair comfortably without any pain. Pleasant follows all commands   Assessment/Plan: L hip fx s/p fall -S/P hip repair see note below  -Will discuss placement in SNF which has rehabilitative services in [place]   Severe Hypokalemia -Patient with slight hyperkalemia today DC'd fluid with potassium in it, will continue to follow closely.   Hypomagnesemia -Resolved continue to monitor    Hyponatremia -Stable within safe range.  Pulmonary hypertension/tricuspid valve regurgitation -Patient's EF within normal range -Strict I&O -Monitor closely for fluid overload  Hypotension - DH -Improving with hydration  -Continue D5-normal saline 64ml/hr  CAD native artery -See pulmonary hypertension  Hypothyroid -Cont synthroid 50 g daily  Alz disease -Continue Aricept 5 mg daily  -Continue Myrbetriq 25 mg daily    Thrombocytopenia  -Resolved   Code Status: FULL Family Communication: no family present at time of exam Disposition Plan: SNF   Consultants: Dr. Leandrew Koyanagi (Orthopedics)   Procedure/Significant Events: 9/21 left hip fracture;Treatment of intertrochanteric, pertrochanteric,  subtrochanteric fracture with intramedullary implant. CPT 79892  9/21 echocardiogram;- Left ventricle:mild LVH. Focal basal hypertrophy. -LVEF= 65% to 70%.  - (grade 1 diastolic dysfunction). - Tricuspid valve: moderate regurgitation. - Pulmonary arteries: PA peak pressure: 43 mm Hg (S).  Culture   Antibiotics:   DVT prophylaxis: Lovenox   Devices    LINES / TUBES:      Continuous Infusions: . dextrose 5 % and 0.9% NaCl      Objective: VITAL SIGNS: Temp: 99.1 F (37.3 C) (09/22 1630) Temp Source: Oral (09/22 1630) BP: 110/93 mmHg (09/22 1630) Pulse Rate: 101 (09/22 1630) SPO2; FIO2:   Intake/Output Summary (Last 24 hours) at 05/10/15 1913 Last data filed at 05/10/15 1400  Gross per 24 hour  Intake 1084.17 ml  Output      0 ml  Net 1084.17 ml     Exam: General: A/O x 1(does not know where,when,why), however patient does know she is not hospital, and that it's 2016.,No acute respiratory distress Eyes: Negative headache, eye pain, double vision,negative scleral hemorrhage ENT: Negative Runny nose, negative ear pain, negative gingival bleeding, Neck:  Negative scars, masses, torticollis, lymphadenopathy, JVD Lungs: Clear to auscultation bilaterally without wheezes or crackles Cardiovascular: Regular rate and rhythm without murmur gallop or rub normal S1 and S2 Abdomen:negative abdominal pain, nondistended, positive soft, bowel sounds, no rebound, no ascites, no appreciable mass Extremities: No significant cyanosis, clubbing, or edema bilateral lower extremities, left lateral hip incision covered and clean negative sign of infection, negative sign of swelling, negative pain to palpation Psychiatric:  Pleasantly demented  Neurologic:  Cranial nerves II through XII intact, tongue/uvula midline, all extremities muscle strength 5/5, sensation intact throughout,, negative dysarthria, negative expressive aphasia, negative  receptive aphasia.   Data Reviewed: Basic  Metabolic Panel:  Recent Labs Lab 05/05/15 0233  05/06/15 0239 05/06/15 1921 05/07/15 0225 05/08/15 0319 05/10/15 0417  NA 124*  < > 132* 129* 132* 130* 130*  K <2.0*  < > 2.5* 4.0 3.7 4.2 5.7*  CL 72*  < > 89* 91* 97* 98* 99*  CO2 39*  < > 33* 31 31 28 23   GLUCOSE 123*  < > 78 133* 115* 95 99  BUN 12  < > <5* 5* <5* <5* 5*  CREATININE 0.65  < > 0.53 0.53 0.53 0.46 0.61  CALCIUM 8.2*  < > 7.8* 8.0* 7.9* 7.9* 8.5*  MG 1.6*  --  1.5* 2.9* 2.3 1.9  --   < > = values in this interval not displayed. Liver Function Tests:  Recent Labs Lab 05/05/15 0233 05/08/15 0319  AST 39 24  ALT 22 15  ALKPHOS 78 68  BILITOT 1.2 1.1  PROT 5.8* 4.8*  ALBUMIN 3.5 2.7*   No results for input(s): LIPASE, AMYLASE in the last 168 hours. No results for input(s): AMMONIA in the last 168 hours. CBC:  Recent Labs Lab 05/05/15 0233 05/05/15 1452 05/06/15 0239 05/08/15 0319 05/10/15 0417  WBC 8.8  --  6.5 6.2 13.3*  NEUTROABS 7.7  --   --   --   --   HGB 13.0 11.6* 11.4* 12.4 12.2  HCT 36.2 34.0* 32.3* 35.5* 36.1  MCV 88.1  --  90.0 91.5 90.9  PLT 123*  --  PLATELET CLUMPS NOTED ON SMEAR, COUNT APPEARS ADEQUATE 127* 157   Cardiac Enzymes: No results for input(s): CKTOTAL, CKMB, CKMBINDEX, TROPONINI in the last 168 hours. BNP (last 3 results) No results for input(s): BNP in the last 8760 hours.  ProBNP (last 3 results) No results for input(s): PROBNP in the last 8760 hours.  CBG:  Recent Labs Lab 05/06/15 1607 05/06/15 2114 05/07/15 0735 05/07/15 2122 05/08/15 2252  GLUCAP 119* 146* 101* 112* 94    Recent Results (from the past 240 hour(s))  MRSA PCR Screening     Status: None   Collection Time: 05/05/15  7:13 PM  Result Value Ref Range Status   MRSA by PCR NEGATIVE NEGATIVE Final    Comment:        The GeneXpert MRSA Assay (FDA approved for NASAL specimens only), is one component of a comprehensive MRSA colonization surveillance program. It is not intended to  diagnose MRSA infection nor to guide or monitor treatment for MRSA infections.   C difficile quick scan w PCR reflex     Status: None   Collection Time: 05/08/15  5:45 AM  Result Value Ref Range Status   C Diff antigen NEGATIVE NEGATIVE Final   C Diff toxin NEGATIVE NEGATIVE Final   C Diff interpretation Negative for toxigenic C. difficile  Final     Studies:  Recent x-ray studies have been reviewed in detail by the Attending Physician  Scheduled Meds:  Scheduled Meds: . antiseptic oral rinse  7 mL Mouth Rinse BID  . donepezil  5 mg Oral QHS  . [START ON 05/11/2015] enoxaparin (LOVENOX) injection  30 mg Subcutaneous Q24H  . levothyroxine  50 mcg Oral QAC breakfast  . mirabegron ER  25 mg Oral Daily  . sertraline  100 mg Oral Daily    Time spent on care of this patient: 40 mins   WOODS, Geraldo Docker , MD  [facility] Office  [phone] Pager - [phone]  On-Call/Text Page:      [email or web address]      password [email or web address]  If 7PM-7AM, please contact night-coverage [email or web address] Password [facility] 05/10/2015, 7:13 PM   LOS: 5 days   Care during the described time interval was provided by me .  I have reviewed this patient's available data, including medical history, events of note, physical examination, and all test results as part of my evaluation. I have personally reviewed and interpreted all radiology studies.   Dia Crawford, MD [phone] Pager

## 2015-05-10 NOTE — Progress Notes (Signed)
   Subjective:  Patient reports pain as mild.    Objective:   VITALS:   Filed Vitals:   05/10/15 0400 05/10/15 0500 05/10/15 0600 05/10/15 0700  BP: 101/71 91/71 102/70 128/85  Pulse: 94 93 95 104  Temp:      TempSrc:      Resp: 18 16 20 22   Height:      Weight:      SpO2: 93% 94% 92% 93%    Neurovascular intact Sensation intact distally Intact pulses distally Dorsiflexion/Plantar flexion intact Incision: dressing C/D/I and no drainage No cellulitis present Compartment soft   Lab Results  Component Value Date   WBC 13.3* 05/10/2015   HGB 12.2 05/10/2015   HCT 36.1 05/10/2015   MCV 90.9 05/10/2015   PLT 157 05/10/2015     Assessment/Plan:  1 Day Post-Op   - Expected postop acute blood loss anemia - will monitor for symptoms - Up with PT/OT - DVT ppx - SCDs, ambulation, lovenox - WBAT operative extremity - Pain control - Discharge planning - SNF  Marianna Payment 05/10/2015, 8:14 AM [phone]

## 2015-05-10 NOTE — Evaluation (Signed)
Occupational Therapy Evaluation Patient Details Name: Laura Benitez MRN: [id number] DOB: [birth year] Today's Date: 05/10/2015    History of Present Illness Laura Benitez is a 81 y.o. female with a history of Alzheimer's Disease, CAD, PAD, Hypothyroid, and Osteoporosis who was found on the floor in her room at the [facility]F. The fall was not witnessed. She had complaints of left hip pain.  CT showed comminuted and displaced L intertrocanteric hip fx, x/p IM nailing   Clinical Impression    Pt admitted with above.  She presents to OT with the below listed deficits.   She will benefit from continued OT to maximize safety and independence with ADLs.  Recommend SNF level rehab and all further OT needs can be addressed at SNF.  Acute OT will sign off at this time.   Follow Up Recommendations  SNF    Equipment Recommendations  None recommended by OT    Recommendations for Other Services       Precautions / Restrictions Precautions Precautions: Fall Restrictions Weight Bearing Restrictions: Yes LLE Weight Bearing: Weight bearing as tolerated      Mobility Bed Mobility Overal bed mobility: Needs Assistance;+2 for physical assistance Bed Mobility: Supine to Sit     Supine to sit: Min assist;+2 for physical assistance     General bed mobility comments: cues for bridging, general technique to EOB.  Assist for w/shift and scooting to EOB  Transfers Overall transfer level: Needs assistance Equipment used: Rolling walker (2 wheeled) Transfers: Sit to/from [facility] Sit to Stand: Mod assist;+2 safety/equipment Stand pivot transfers: Mod assist;+2 safety/equipment       General transfer comment: pt with moderately heavy lean posterior and stepping to chair retropulsive in nature.    Balance Overall balance assessment: Needs assistance Sitting-balance support: Feet supported Sitting balance-Leahy Scale: Fair     Standing balance support: Bilateral  upper extremity supported Standing balance-Leahy Scale: Poor                              ADL Overall ADL's : Needs assistance/impaired Eating/Feeding: Set up;Sitting   Grooming: Wash/dry hands;Wash/dry face;Oral care;Brushing hair;Set up;Sitting   Upper Body Bathing: Minimal assitance;Sitting   Lower Body Bathing: Maximal assistance;Sit to/from stand   Upper Body Dressing : Moderate assistance;Sitting   Lower Body Dressing: Total assistance;Sit to/from stand   Toilet Transfer: Moderate assistance;+2 for safety/equipment   Toileting- Clothing Manipulation and Hygiene: Total assistance;Sit to/from stand       Functional mobility during ADLs: Moderate assistance;+2 for safety/equipment       Vision     Perception     Praxis      Pertinent Vitals/Pain Pain Assessment: Faces Faces Pain Scale: Hurts little more Pain Location: Lt hip Pain Descriptors / Indicators: Grimacing;Guarding Pain Intervention(s): Monitored during session;Repositioned;Limited activity within patient's tolerance     Hand Dominance     Extremity/Trunk Assessment Upper Extremity Assessment Upper Extremity Assessment: Generalized weakness;LUE deficits/detail LUE Deficits / Details: Lt shoulder flexion limted to 90* actively.  [facility] AAROM    Lower Extremity Assessment Lower Extremity Assessment: Defer to PT evaluation LLE Deficits / Details: moves against gravity after intial warm up LLE: Unable to fully assess due to pain   Cervical / Trunk Assessment Cervical / Trunk Assessment: Kyphotic;Other exceptions Cervical / Trunk Exceptions: severe kyphosis   Communication     Cognition Arousal/Alertness: Awake/alert Behavior During Therapy: WFL for tasks assessed/performed Overall Cognitive Status:  History of cognitive impairments - at baseline                     General Comments       Exercises Exercises:  (warm up exercise)     Shoulder Instructions      Home  Living Family/patient expects to be discharged to:: Skilled nursing facility                                 Additional Comments: Pt lived at ALF at [facility]       Prior Functioning/Environment Level of Independence: Needs assistance  Gait / Transfers Assistance Needed: RW for gait ADL's / Homemaking Assistance Needed: assist for bathing        OT Diagnosis: Generalized weakness;Cognitive deficits   OT Problem List: Decreased strength;Decreased activity tolerance;Impaired balance (sitting and/or standing);Decreased safety awareness;Decreased knowledge of use of DME or AE;Decreased knowledge of precautions;Cardiopulmonary status limiting activity;Pain   OT Treatment/Interventions:      OT Goals(Current goals can be found in the care plan section) Acute Rehab OT Goals Patient Stated Goal: to get better  OT Goal Formulation: All assessment and education complete, DC therapy  OT Frequency:     Barriers to D/C:            Co-evaluation PT/OT/SLP Co-Evaluation/Treatment: Yes Reason for Co-Treatment: For patient/therapist safety PT goals addressed during session: Mobility/safety with mobility OT goals addressed during session: ADL's and self-care      End of Session Equipment Utilized During Treatment: Rolling walker;Oxygen Nurse Communication: Mobility status  Activity Tolerance: Patient tolerated treatment well Patient left: in chair;with call bell/phone within reach;with chair alarm set;with family/visitor present   Time: 1230-1310 OT Time Calculation (min): 40 min Charges:  OT General Charges $OT Visit: 1 Procedure OT Evaluation $Initial OT Evaluation Tier I: 1 Procedure G-Codes:    Conarpe, Ellard Artis M 05-15-15, 1:41 PM

## 2015-05-10 NOTE — Progress Notes (Addendum)
UR COMPLETED  

## 2015-05-10 NOTE — Progress Notes (Signed)
Orthopedic Tech Progress Note Patient Details:  Laura Benitez 1929-07-08 [id number] Patient's bed unable to support OHF; furthermore, patient unable to use OHF Patient ID: Laura Benitez, female   DOB: 08/23/1928, 81 y.o.   MRN: [id number]   Laura Benitez 05/10/2015, 1:07 AM

## 2015-05-10 NOTE — Evaluation (Signed)
Physical Therapy Evaluation Patient Details Name: Laura Benitez MRN: [id number] DOB: [birth year] Today's Date: 05/10/2015   History of Present Illness  Laura Benitez is a 81 y.o. female with a history of Alzheimer's Disease, CAD, PAD, Hypothyroid, and Osteoporosis who was found on the floor in her room at the [facility] SNF. The fall was not witnessed. She had complaints of left hip pain.  CT showed comminuted and displaced L intertrocanteric hip fx, x/p IM nailing  Clinical Impression  Pt admitted with/for fall with hip fx and IM nailing on the LEFT.  Pt currently limited functionally due to the problems listed. ( See problems list.)   Pt will benefit from PT to maximize function and safety in order to get ready for next venue listed below.     Follow Up Recommendations SNF    Equipment Recommendations  None recommended by PT    Recommendations for Other Services       Precautions / Restrictions Precautions Precautions: Fall Restrictions Weight Bearing Restrictions: Yes LLE Weight Bearing: Weight bearing as tolerated      Mobility  Bed Mobility Overal bed mobility: Needs Assistance;+2 for physical assistance Bed Mobility: Supine to Sit     Supine to sit: Min assist;+2 for physical assistance     General bed mobility comments: cues for bridging, general technique to EOB.  Assist for w/shift and scooting to EOB  Transfers Overall transfer level: Needs assistance Equipment used: Rolling walker (2 wheeled) Transfers: Sit to/from [facility] Sit to Stand: Mod assist;+2 safety/equipment Stand pivot transfers: Mod assist;+2 safety/equipment       General transfer comment: pt with moderately heavy lean posterior and stepping to chair retropulsive in nature.  Ambulation/Gait Ambulation/Gait assistance: Mod assist;+2 safety/equipment           General Gait Details: steps to the chair with RW and +2  Stairs            Wheelchair Mobility     Modified Rankin (Stroke Patients Only)       Balance Overall balance assessment: Needs assistance Sitting-balance support: Feet supported Sitting balance-Leahy Scale: Fair     Standing balance support: Bilateral upper extremity supported Standing balance-Leahy Scale: Poor                               Pertinent Vitals/Pain Pain Assessment: Faces Faces Pain Scale: Hurts little more Pain Location: Lt hip Pain Descriptors / Indicators: Grimacing;Guarding Pain Intervention(s): Monitored during session;Repositioned;Limited activity within patient's tolerance    Home Living Family/patient expects to be discharged to:: Skilled nursing facility                 Additional Comments: Pt lived at ALF at [facility]     Prior Function Level of Independence: Needs assistance   Gait / Transfers Assistance Needed: RW for gait  ADL's / Homemaking Assistance Needed: assist for bathing        Hand Dominance        Extremity/Trunk Assessment   Upper Extremity Assessment: Generalized weakness;LUE deficits/detail       LUE Deficits / Details: Lt shoulder flexion limted to 90* actively.  WFL AAROM    Lower Extremity Assessment: Defer to PT evaluation   LLE Deficits / Details: moves against gravity after intial warm up  Cervical / Trunk Assessment: Kyphotic;Other exceptions  Communication      Cognition Arousal/Alertness: Awake/alert Behavior During Therapy: WFL for  tasks assessed/performed Overall Cognitive Status: History of cognitive impairments - at baseline                      General Comments General comments (skin integrity, edema, etc.): 02 sats decreased to 86% on RA.  Rebounded to 95% on 2L supplemental 02    Exercises        Assessment/Plan    PT Assessment Patient needs continued PT services  PT Diagnosis Difficulty walking;Generalized weakness;Acute pain   PT Problem List Decreased strength;Decreased activity  tolerance;Decreased mobility;Decreased knowledge of use of DME;Pain  PT Treatment Interventions Gait training;DME instruction;Functional mobility training;Therapeutic activities;Patient/family education;Therapeutic exercise   PT Goals (Current goals can be found in the Care Plan section) Acute Rehab PT Goals Patient Stated Goal: to get better  PT Goal Formulation: With patient Time For Goal Achievement: 05/17/15    Frequency Min 3X/week   Barriers to discharge        Co-evaluation PT/OT/SLP Co-Evaluation/Treatment: Yes Reason for Co-Treatment: For patient/therapist safety PT goals addressed during session: Mobility/safety with mobility OT goals addressed during session: ADL's and self-care       End of Session   Activity Tolerance: Patient tolerated treatment well;Patient limited by pain Patient left: in chair;with call bell/phone within reach;with chair alarm set;with family/visitor present Nurse Communication: Mobility status         Time: 1230-1313 PT Time Calculation (min) (ACUTE ONLY): 43 min   Charges:   PT Evaluation $Initial PT Evaluation Tier I: 1 Procedure PT Treatments $Therapeutic Activity: 8-22 mins   PT G Codes:        Mottinger, Tessie Fass 05/10/2015, 1:39 PM  05/10/2015  Donnella Sham, PT [phone]  (pager)

## 2015-05-10 NOTE — Progress Notes (Addendum)
Referral received by CM for hh , medication and equipment needs. Plan is for pt to return to SNF, CM to f/u with disposition needs. Whitman Hero RN,BSN,CM [phone]

## 2015-05-11 DIAGNOSIS — D62 Acute posthemorrhagic anemia: Secondary | ICD-10-CM

## 2015-05-11 LAB — CBC WITH DIFFERENTIAL/PLATELET
Basophils Absolute: 0 10*3/uL (ref 0.0–0.1)
Basophils Relative: 0 %
EOS ABS: 0 10*3/uL (ref 0.0–0.7)
Eosinophils Relative: 0 %
HCT: 27.4 % — ABNORMAL LOW (ref 36.0–46.0)
HEMOGLOBIN: 9.4 g/dL — AB (ref 12.0–15.0)
LYMPHS ABS: 0.9 10*3/uL (ref 0.7–4.0)
Lymphocytes Relative: 11 %
MCH: 31.2 pg (ref 26.0–34.0)
MCHC: 34.3 g/dL (ref 30.0–36.0)
MCV: 91 fL (ref 78.0–100.0)
MONOS PCT: 11 %
Monocytes Absolute: 0.8 10*3/uL (ref 0.1–1.0)
NEUTROS PCT: 79 %
Neutro Abs: 6.2 10*3/uL (ref 1.7–7.7)
Platelets: 149 10*3/uL — ABNORMAL LOW (ref 150–400)
RBC: 3.01 MIL/uL — ABNORMAL LOW (ref 3.87–5.11)
RDW: 14.2 % (ref 11.5–15.5)
WBC: 7.9 10*3/uL (ref 4.0–10.5)

## 2015-05-11 LAB — PREPARE RBC (CROSSMATCH)

## 2015-05-11 LAB — BASIC METABOLIC PANEL
ANION GAP: 5 (ref 5–15)
BUN: 11 mg/dL (ref 6–20)
CALCIUM: 8 mg/dL — AB (ref 8.9–10.3)
CO2: 27 mmol/L (ref 22–32)
CREATININE: 0.59 mg/dL (ref 0.44–1.00)
Chloride: 96 mmol/L — ABNORMAL LOW (ref 101–111)
GFR calc Af Amer: >60 mL/min (ref >60)
GFR calc non Af Amer: >60 mL/min (ref >60)
GLUCOSE: 142 mg/dL — AB (ref 65–99)
Potassium: 4.7 mmol/L (ref 3.5–5.1)
Sodium: 128 mmol/L — ABNORMAL LOW (ref 135–145)

## 2015-05-11 LAB — ABO/RH: ABO/RH(D): O POS

## 2015-05-11 LAB — MAGNESIUM: Magnesium: 1.6 mg/dL — ABNORMAL LOW (ref 1.7–2.4)

## 2015-05-11 LAB — HEMOGLOBIN AND HEMATOCRIT, BLOOD
HCT: 25.1 % — ABNORMAL LOW (ref 36.0–46.0)
HEMOGLOBIN: 8.9 g/dL — AB (ref 12.0–15.0)

## 2015-05-11 MED ORDER — MAGNESIUM OXIDE 400 (241.3 MG) MG PO TABS
400.0000 mg | ORAL_TABLET | Freq: Once | ORAL | Status: AC
  Administered 2015-05-11: 400 mg via ORAL
  Filled 2015-05-11: qty 1

## 2015-05-11 MED ORDER — SODIUM CHLORIDE 0.9 % IV SOLN
Freq: Once | INTRAVENOUS | Status: AC
  Administered 2015-05-11: via INTRAVENOUS

## 2015-05-11 MED ORDER — FUROSEMIDE 10 MG/ML IJ SOLN
20.0000 mg | Freq: Once | INTRAMUSCULAR | Status: AC
  Administered 2015-05-12: 20 mg via INTRAVENOUS
  Filled 2015-05-11: qty 2

## 2015-05-11 NOTE — Progress Notes (Signed)
Physical Therapy Treatment Patient Details Name: Laura Benitez MRN: [id number] DOB: May 03, 1929 Today's Date: 05/11/2015    History of Present Illness Laura Benitez is a 81 y.o. female with a history of Alzheimer's Disease, CAD, PAD, Hypothyroid, and Osteoporosis who was found on the floor in her room at the [facility] SNF. The fall was not witnessed. She had complaints of left hip pain.  CT showed comminuted and displaced L intertrocanteric hip fx, x/p IM nailing    PT Comments    Progressing slowly.  Emphasis on exercises, standing and there ex.  Could not get pt to focus on standing and walking task as well as on eval, suspect fear.  Follow Up Recommendations  SNF     Equipment Recommendations  None recommended by PT    Recommendations for Other Services       Precautions / Restrictions Precautions Precautions: Fall Restrictions RLE Weight Bearing: Weight bearing as tolerated LLE Weight Bearing: Weight bearing as tolerated    Mobility  Bed Mobility                  Transfers Overall transfer level: Needs assistance Equipment used: Rolling walker (2 wheeled) Transfers: Sit to/from Stand Sit to Stand: Mod assist         General transfer comment: heavy lean posteriorly.  Had to assist with w/shift and hold to help pt advance LE's  Ambulation/Gait   Ambulation Distance (Feet): 2 Feet (forward and back with difficulty) Assistive device: Rolling walker (2 wheeled) Gait Pattern/deviations: Step-to pattern;Decreased step length - right;Decreased step length - left     General Gait Details: weak, shuffled steps with posterior lean.   Stairs            Wheelchair Mobility    Modified Rankin (Stroke Patients Only)       Balance Overall balance assessment: Needs assistance Sitting-balance support: Single extremity supported;Bilateral upper extremity supported Sitting balance-Leahy Scale: Poor Sitting balance - Comments: not able to sit edge of  chair without assist today     Standing balance-Leahy Scale: Poor Standing balance comment: posterior lean                    Cognition Arousal/Alertness: Awake/alert;Lethargic Behavior During Therapy: WFL for tasks assessed/performed Overall Cognitive Status: History of cognitive impairments - at baseline                      Exercises Total Joint Exercises Ankle Circles/Pumps: AROM;Both;10 reps;Seated Quad Sets: AROM;Both;10 reps;Supine Heel Slides: AAROM;Both;10 reps;Seated;Other (comment) (10 reps resisted gross extention)    General Comments        Pertinent Vitals/Pain Pain Assessment: Faces Faces Pain Scale: Hurts a little bit Pain Location: L LE Pain Descriptors / Indicators: Sore Pain Intervention(s): Monitored during session    Home Living                      Prior Function            PT Goals (current goals can now be found in the care plan section) Acute Rehab PT Goals PT Goal Formulation: With patient Time For Goal Achievement: 05/17/15 Progress towards PT goals: Progressing toward goals    Frequency  Min 3X/week    PT Plan Current plan remains appropriate    Co-evaluation             End of Session   Activity Tolerance: Patient tolerated treatment well;Patient limited by  pain Patient left: in chair;with call bell/phone within reach;with family/visitor present     Time: 6394-3200 PT Time Calculation (min) (ACUTE ONLY): 20 min  Charges:  $Therapeutic Exercise: 8-22 mins                    G Codes:      Mottinger, Tessie Fass 05/11/2015, 4:21 PM

## 2015-05-11 NOTE — Progress Notes (Signed)
Speech Language Pathology Treatment: Dysphagia  Patient Details Name: Laura Benitez MRN: [id number] DOB: [birth year] Today's Date: 05/11/2015 Time: 1027-2536 SLP Time Calculation (min) (ACUTE ONLY): 10 min  Assessment / Plan / Recommendation Clinical Impression  Pt frequently requesting to "spit" this morning, orally expectorating small amounts of clear secretions at a time. She more appropriately manages her saliva with Mod cues from SLP to swallow in order to clear her mouth. Cup sips of nectar thick liquids elicit a consistent second swallow, although no overt signs of aspiration are noted. Pt declined solid foods, but NT present said that she tolerated small amounts of purees and soft solids at breakfast. RN does report difficulty taking pills whole in puree - would recommend crushing pills in puree.   HPI Other Pertinent Information: 81 y.o. female with a history of Alzheimer's Disease, CAD, PAD, Hypothyroid, dysphagia (2012), GERD, acute bronchitis, who was found on the floor and sustained t SNF with left hip pain.Per MD note pt found to have multiple electrolyte abnormalities. X-rays of the revealed a left minimally displaced IT hip fx. Surgery possibly Monday. No CXR perormed. RN reports pt stated she uses thickener at SNF and was unable to swallow meds with thickened liquids and swallow assessment ordered.No ST documentation found.   Pertinent Vitals Pain Assessment: Faces Faces Pain Scale: No hurt  SLP Plan  Continue with current plan of care    Recommendations Diet recommendations: Dysphagia 3 (mechanical soft);Nectar-thick liquid Liquids provided via: Cup;No straw Medication Administration: Crushed with puree Supervision: Staff to assist with self feeding Compensations: Slow rate;Small sips/bites;Check for pocketing Postural Changes and/or Swallow Maneuvers: Seated upright 90 degrees       Oral Care Recommendations: Oral care BID Follow up Recommendations: Skilled Nursing  facility Plan: Continue with current plan of care    Germain Osgood, M.A. CCC-SLP [phone]  Germain Osgood 05/11/2015, 11:41 AM

## 2015-05-11 NOTE — Clinical Social Work Note (Addendum)
CSW reviewed chart. CSW initiated insurance authorization. Once pt is medically stable she will transition to [facility] SNF. CSW will continue to follow and assistant with discharge needs.   Authorization given to Juliann Pulse from [facility]   [place], MSW, [place]

## 2015-05-11 NOTE — Care Management Important Message (Signed)
Important Message  Patient Details  Name: Laura Benitez MRN: [id number] Date of Birth: 1929/02/10   Medicare Important Message Given:  Yes-third notification given    Loann Quill 05/11/2015, 10:46 AM

## 2015-05-11 NOTE — Progress Notes (Signed)
[facility] TEAM 1 - Stepdown/ICU TEAM Progress Note  Laura Benitez:[id number] DOB: 09/30/1928 DOA: 05/05/2015 PCP: [name], MD  Admit HPI / Brief Narrative: 81 y.o. WF PMHx Depression, Anxiety, major depressive disorder Alzheimer's Disease, CAD, PVD PAD, CHF Hypothyroidism, and Osteoporosis   Found on the floor in her room atthe [facility]. The fall was not witnessed. She had complaints of left hip pain. She was evaluated in the ED and found to have multiple electrolyte abnormalities, and imaging studies revealed a possible cortical fracture of the L femoral head, so a CT scan of the hip was ordered. She was also hypotensive.    HPI/Subjective: 9/23 patient very lethargic, but arousable A/O x 1(does not know where,when,why), however patient does know she is not hospital, and that it's 2016. Laying in bed feeling very fatigued, has not felt like sitting in chair today  Pleasant follows all commands   Assessment/Plan: L hip fx s/p fall -S/P hip repair see note below  -Will D/C to SNF in A.m.   Severe Hypokalemia -Patient with slight hyperkalemia today DC'd fluid with potassium in it, will continue to follow closely.   Hypomagnesemia -Resolved continue to monitor    Hyponatremia -Stable within safe range.  Pulmonary hypertension/tricuspid valve regurgitation -Patient's EF within normal range -Strict I&O; + 12.5 L -Monitor closely for fluid overload  Hypotension - DH -improved with hydration   CAD native artery -See pulmonary hypertension  Hypothyroid -Cont synthroid 50 g daily  Alz disease -Continue Aricept 5 mg daily  -Continue Myrbetriq 25 mg daily    Thrombocytopenia  -Resolved  Acute blood loss anemia -Patient hemoglobin normally~11, now 8.9 with increased fatigue and lethargy -9/23 Transfuse 2 units PRBC    Code Status: FULL Family Communication: no family present at time of exam Disposition Plan: SNF   Consultants: Dr.  Leandrew Koyanagi (Orthopedics)   Procedure/Significant Events: 9/21 left hip fracture;Treatment of intertrochanteric, pertrochanteric, subtrochanteric fracture with intramedullary implant. CPT 01027  9/21 echocardiogram;- Left ventricle:mild LVH. Focal basal hypertrophy. -LVEF= 65% to 70%.  - (grade 1 diastolic dysfunction). - Tricuspid valve: moderate regurgitation. - Pulmonary arteries: PA peak pressure: 43 mm Hg (S). -9/23 Transfuse 2 units PRBC   Culture   Antibiotics:   DVT prophylaxis: Lovenox   Devices    LINES / TUBES:      Continuous Infusions:    Objective: VITAL SIGNS: Temp: 98.3 F (36.8 C) (09/23 1940) Temp Source: Oral (09/23 1940) BP: 98/66 mmHg (09/23 1522) Pulse Rate: 94 (09/23 1522) SPO2; FIO2:   Intake/Output Summary (Last 24 hours) at 05/11/15 2012 Last data filed at 05/11/15 1552  Gross per 24 hour  Intake 1506.25 ml  Output      0 ml  Net 1506.25 ml     Exam: General: very lethargic, but arousable A/O x 1(does not know where,when,why),No acute respiratory distress Eyes: Negative headache, eye pain, double vision,negative scleral hemorrhage ENT: Negative Runny nose, negative ear pain, negative gingival bleeding, Neck:  Negative scars, masses, torticollis, lymphadenopathy, JVD Lungs: Clear to auscultation bilaterally without wheezes or crackles Cardiovascular: Regular rate and rhythm without murmur gallop or rub normal S1 and S2 Abdomen:negative abdominal pain, nondistended, positive soft, bowel sounds, no rebound, no ascites, no appreciable mass Extremities: No significant cyanosis, clubbing, or edema bilateral lower extremities, left lateral hip incision covered and clean negative sign of infection, negative sign of swelling, negative pain to palpation Psychiatric:  Pleasantly demented  Neurologic:  Cranial nerves II through XII  intact, tongue/uvula midline, all extremities muscle strength 5/5, sensation intact throughout,, negative  dysarthria, negative expressive aphasia, negative receptive aphasia.   Data Reviewed: Basic Metabolic Panel:  Recent Labs Lab 05/06/15 0239 05/06/15 1921 05/07/15 0225 05/08/15 0319 05/10/15 0417 05/11/15 0308  NA 132* 129* 132* 130* 130* 128*  K 2.5* 4.0 3.7 4.2 5.7* 4.7  CL 89* 91* 97* 98* 99* 96*  CO2 33* 31 31 28 23 27   GLUCOSE 78 133* 115* 95 99 142*  BUN <5* 5* <5* <5* 5* 11  CREATININE 0.53 0.53 0.53 0.46 0.61 0.59  CALCIUM 7.8* 8.0* 7.9* 7.9* 8.5* 8.0*  MG 1.5* 2.9* 2.3 1.9  --  1.6*   Liver Function Tests:  Recent Labs Lab 05/05/15 0233 05/08/15 0319  AST 39 24  ALT 22 15  ALKPHOS 78 68  BILITOT 1.2 1.1  PROT 5.8* 4.8*  ALBUMIN 3.5 2.7*   No results for input(s): LIPASE, AMYLASE in the last 168 hours. No results for input(s): AMMONIA in the last 168 hours. CBC:  Recent Labs Lab 05/05/15 0233  05/06/15 0239 05/08/15 0319 05/10/15 0417 05/11/15 0305 05/11/15 1234  WBC 8.8  --  6.5 6.2 13.3* 7.9  --   NEUTROABS 7.7  --   --   --   --  6.2  --   HGB 13.0  < > 11.4* 12.4 12.2 9.4* 8.9*  HCT 36.2  < > 32.3* 35.5* 36.1 27.4* 25.1*  MCV 88.1  --  90.0 91.5 90.9 91.0  --   PLT 123*  --  PLATELET CLUMPS NOTED ON SMEAR, COUNT APPEARS ADEQUATE 127* 157 149*  --   < > = values in this interval not displayed. Cardiac Enzymes: No results for input(s): CKTOTAL, CKMB, CKMBINDEX, TROPONINI in the last 168 hours. BNP (last 3 results) No results for input(s): BNP in the last 8760 hours.  ProBNP (last 3 results) No results for input(s): PROBNP in the last 8760 hours.  CBG:  Recent Labs Lab 05/06/15 1607 05/06/15 2114 05/07/15 0735 05/07/15 2122 05/08/15 2252  GLUCAP 119* 146* 101* 112* 94    Recent Results (from the past 240 hour(s))  MRSA PCR Screening     Status: None   Collection Time: 05/05/15  7:13 PM  Result Value Ref Range Status   MRSA by PCR NEGATIVE NEGATIVE Final    Comment:        The GeneXpert MRSA Assay (FDA approved for NASAL  specimens only), is one component of a comprehensive MRSA colonization surveillance program. It is not intended to diagnose MRSA infection nor to guide or monitor treatment for MRSA infections.   C difficile quick scan w PCR reflex     Status: None   Collection Time: 05/08/15  5:45 AM  Result Value Ref Range Status   C Diff antigen NEGATIVE NEGATIVE Final   C Diff toxin NEGATIVE NEGATIVE Final   C Diff interpretation Negative for toxigenic C. difficile  Final     Studies:  Recent x-ray studies have been reviewed in detail by the Attending Physician  Scheduled Meds:  Scheduled Meds: . sodium chloride   Intravenous Once  . antiseptic oral rinse  7 mL Mouth Rinse BID  . donepezil  5 mg Oral QHS  . enoxaparin (LOVENOX) injection  30 mg Subcutaneous Q24H  . furosemide  20 mg Intravenous Once  . levothyroxine  50 mcg Oral QAC breakfast  . mirabegron ER  25 mg Oral Daily  . sertraline  100 mg Oral  Daily    Time spent on care of this patient: 40 mins   WOODS, Geraldo Docker , MD  [facility] Office  [phone] Pager [phone]  On-Call/Text Page:      [email or web address]      password [email or web address]  If 7PM-7AM, please contact night-coverage [email or web address] Password [email or web address] 05/11/2015, 8:12 PM   LOS: 6 days   Care during the described time interval was provided by me .  I have reviewed this patient's available data, including medical history, events of note, physical examination, and all test results as part of my evaluation. I have personally reviewed and interpreted all radiology studies.   Dia Crawford, MD [phone] Pager

## 2015-05-12 LAB — BASIC METABOLIC PANEL
Anion gap: 8 (ref 5–15)
BUN: 9 mg/dL (ref 6–20)
CALCIUM: 8.1 mg/dL — AB (ref 8.9–10.3)
CO2: 28 mmol/L (ref 22–32)
CREATININE: 0.55 mg/dL (ref 0.44–1.00)
Chloride: 97 mmol/L — ABNORMAL LOW (ref 101–111)
Glucose, Bld: 128 mg/dL — ABNORMAL HIGH (ref 65–99)
Potassium: 3.7 mmol/L (ref 3.5–5.1)
SODIUM: 133 mmol/L — AB (ref 135–145)

## 2015-05-12 LAB — CBC
HCT: 37.6 % (ref 36.0–46.0)
Hemoglobin: 12.9 g/dL (ref 12.0–15.0)
MCH: 30.6 pg (ref 26.0–34.0)
MCHC: 34.3 g/dL (ref 30.0–36.0)
MCV: 89.1 fL (ref 78.0–100.0)
PLATELETS: 122 10*3/uL — AB (ref 150–400)
RBC: 4.22 MIL/uL (ref 3.87–5.11)
RDW: 14.4 % (ref 11.5–15.5)
WBC: 8.6 10*3/uL (ref 4.0–10.5)

## 2015-05-12 LAB — MAGNESIUM: MAGNESIUM: 1.7 mg/dL (ref 1.7–2.4)

## 2015-05-12 MED ORDER — RESOURCE THICKENUP CLEAR PO POWD: ORAL | Status: AC

## 2015-05-12 NOTE — Progress Notes (Signed)
Called report to RN at [facility], pt discharged at 11. Bilateral PIV's removed. Consuelo Pandy RN

## 2015-05-12 NOTE — Discharge Summary (Signed)
Physician Discharge Summary  Laura Benitez TTS:[id number] DOB: April 18, 1929 DOA: 05/05/2015  PCP: [name], MD  Admit date: 05/05/2015 Discharge date: 05/12/2015  Time spent: 40 minutes  Recommendations for Outpatient Follow-up:  L hip fx s/p fall -S/P hip repair see note below  -Will D/C to SNF today. -Patient follow-up with Dr. Erlinda Hong, Marylynn Pearson (orthopedic surgery) in 2 weeks  Severe Hypokalemia -Patient with slight hyperkalemia today DC'd fluid with potassium in it, will continue to follow closely.   Hypomagnesemia -Resolved continue to monitor   Hyponatremia -Stable within safe range.  Pulmonary hypertension/tricuspid valve regurgitation -Patient's EF within normal range -Strict I&O; + 12.5 L -Follow-up with Dr. Jeanmarie Hubert in 7-14 days left hip fracture, pulmonary hypertension, tricuspid valve regurgitation, Alzheimer's disease.  Hypotension - DH -improved with hydration   CAD native artery -See pulmonary hypertension  Hypothyroid -Cont synthroid 50 g daily  Alz disease -Continue Aricept 5 mg daily  -Continue Myrbetriq 25 mg daily    Thrombocytopenia  -Resolved  Acute blood loss anemia -Patient hemoglobin normally~11, now 8.9 with increased fatigue and lethargy -9/23 Transfuse 2 units PRBC -Stable    Discharge Diagnoses:  Principal Problem:   Hypokalemia Active Problems:   Hypothyroidism   CAD (coronary artery disease)   PAD (peripheral artery disease)   Alzheimer's disease   Hyponatremia   Fall at nursing home   Thrombocytopenia   Left hip pain   Hypotension   Hip fracture   Pressure ulcer   CAD in native artery   Closed left hip fracture   Protein-calorie malnutrition, severe   Pulmonary hypertension   Tricuspid valve regurgitation   Acute blood loss anemia   Discharge Condition: Stable  Diet recommendation: Dysphagia 3 nectar thick fluid  Filed Weights   05/05/15 1909 05/12/15 0300  Weight: 37 kg (81 lb 9.1 oz) 39.2 kg (86  lb 6.7 oz)    History of present illness:  82 y.o. WF PMHx Depression, Anxiety, major depressive disorder Alzheimer's Disease, CAD, PVD PAD, CHF Hypothyroidism, and Osteoporosis   Found on the floor in [facility] room atthe [facility]. The fall was not witnessed. She had complaints of left hip pain. She was evaluated in the ED and found to have multiple electrolyte abnormalities, and imaging studies revealed a possible cortical fracture of the L femoral head, so a CT scan of the hip was ordered. She was also hypotensive.  During this hospitalization this pleasantly demented lady was found to have a left hip fracture secondary to a fall at her assisted living facility which she does not recall. Dr. Leandrew Koyanagi (Orthopedics surgery) on 9/21 placed a subtrochanteric fracture with intramedullary implant. CPT [phone] in order to repair patient's hip. Patient has been doing extremely well post surgery, except for some acute blood loss during surgery. This has been replaced and patient is cleared for discharge.  Consultants: Dr. Leandrew Koyanagi (Orthopedics)   Procedure/Significant Events: 9/21 left hip fracture;Treatment of intertrochanteric, pertrochanteric, subtrochanteric fracture with intramedullary implant. CPT 00762  9/21 echocardiogram;- Left ventricle:mild LVH. Focal basal hypertrophy. -LVEF= 65% to 70%.  - (grade 1 diastolic dysfunction). - Tricuspid valve: moderate regurgitation. - Pulmonary arteries: PA peak pressure: 43 mm Hg (S). -9/23 Transfuse 2 units PRBC  DVT prophylaxis: Lovenox    Discharge Exam: Filed Vitals:   05/12/15 0520 05/12/15 0700 05/12/15 0800 05/12/15 1100  BP: 107/75  94/58   Pulse: 85  74   Temp: 98.8 F (37.1 C) 98.4 F (36.9 C)  39 F (36.7 C)  TempSrc: Oral Oral  Oral  Resp: 22  23   Height:      Weight:      SpO2:   98%     General:  A/O x 1(does not know where,when,why), follows all commands, No acute respiratory distress Eyes: Negative  headache, eye pain, double vision,negative scleral hemorrhage ENT: Negative Runny nose, negative ear pain, negative gingival bleeding, Neck: Negative scars, masses, torticollis, lymphadenopathy, JVD Lungs: Clear to auscultation bilaterally without wheezes or crackles Cardiovascular: Regular rate and rhythm without murmur gallop or rub normal S1 and S2 Abdomen:negative abdominal pain, nondistended, positive soft, bowel sounds, no rebound, no ascites, no appreciable mass Extremities: No significant cyanosis, clubbing, or edema bilateral lower extremities, left lateral hip incision covered and clean negative sign of infection, negative sign of swelling, negative pain to palpation   Discharge Instructions      Discharge Instructions    Weight bearing as tolerated    Complete by:  As directed             Medication List    STOP taking these medications        furosemide 40 MG tablet  Commonly known as:  LASIX     LORazepam 1 MG tablet  Commonly known as:  ATIVAN      TAKE these medications        ACTONEL 150 MG tablet  Generic drug:  risedronate  Take 150 mg by mouth every 30 (thirty) days. Take  1 tablet once a month on the 29th of the month.     calcium-vitamin D 500-200 MG-UNIT per tablet  Commonly known as:  OSCAL WITH D  Take 1 tablet by mouth 2 (two) times daily.     DEEP SEA NASAL SPRAY 0.65 % nasal spray  Generic drug:  sodium chloride  Place 1 spray into the nose. Use 1 spray in each nostril as needed     donepezil 5 MG tablet  Commonly known as:  ARICEPT  One nightly to help preserve memory     enoxaparin 30 MG/0.3ML injection  Commonly known as:  LOVENOX  Inject 0.3 mLs (30 mg total) into the skin daily.     fluticasone 50 MCG/ACT nasal spray  Commonly known as:  FLONASE  Place 2 sprays into the nose daily. Daily     HYDROcodone-acetaminophen 7.5-325 MG per tablet  Commonly known as:  NORCO  Take 1-2 tablets by mouth every 6 (six) hours as needed for  moderate pain.     levothyroxine 50 MCG tablet  Commonly known as:  SYNTHROID, LEVOTHROID  Take 50 mcg by mouth daily. Take 1 tablet by mouth daily.     loperamide 2 MG capsule  Commonly known as:  IMODIUM  Take 2-4 mg by mouth 3 (three) times daily as needed for diarrhea or loose stools.     [place] 200-200-20 MG/5ML suspension  Generic drug:  alum & mag hydroxide-simeth  Take 30 mLs by mouth 3 (three) times daily. Take 30 ml three times daily as needed for indigestion.     MYRBETRIQ 25 MG Tb24 tablet  Generic drug:  mirabegron ER  Take 25 mg by mouth daily. Take one tablet daily     omeprazole 40 MG capsule  Commonly known as:  PRILOSEC  Take 40 mg by mouth daily.     protein supplement Powd  Take 1 scoop by mouth daily. MIX 1 SCOOP IN 4 OZ. OF HONEY THICK H20 AND  TAKE BY MOUTH AT 2 PM.     psyllium 58.6 % powder  Commonly known as:  METAMUCIL  Take 1 packet by mouth daily as needed (for constipation). Take teaspoonful mixed in 8oz fluide daily as needed     [place]  Follow directions on package for nectar thick liquids     sertraline 100 MG tablet  Commonly known as:  ZOLOFT  One daily to help depression and anxiety     simethicone 125 MG chewable tablet  Commonly known as:  MYLICON  Chew 885 mg by mouth 3 (three) times daily as needed for flatulence.     SYSTANE 0.4-0.3 % Soln  Generic drug:  Polyethyl Glycol-Propyl Glycol  Apply 1 drop to eye every 4 (four) hours as needed (for eye discomfort). Both eyes as needed for comfort     vitamin A 10000 UNIT capsule  Take 10,000 Units by mouth daily.       Allergies  Allergen Reactions  . Milk-Related Compounds Other (See Comments)    Milk products cause patient to have migraine headaches.  Pt can tolerate butter.   Follow-up Information    Follow up with Marianna Payment, MD In 2 weeks.   Specialty:  Orthopedic Surgery   Why:  For suture removal, For wound re-check   Contact information:    [address] [place] [id number] [phone]       Follow up with GREEN, Viviann Spare, MD.   Specialty:  Internal Medicine   Why:  Follow-up with Dr. Jeanmarie Hubert in 1-2 weeks postdischarge left hip fracture, CHF, hypothyroidism, Alzheimer's disease   Contact information:   [address] [address] [phone]        The results of significant diagnostics from this hospitalization (including imaging, microbiology, ancillary and laboratory) are listed below for reference.    Significant Diagnostic Studies: Ct Head Wo Contrast  05/05/2015   CLINICAL DATA:  81 year old female with fall  EXAM: CT HEAD WITHOUT CONTRAST  CT CERVICAL SPINE WITHOUT CONTRAST  TECHNIQUE: Multidetector CT imaging of the head and cervical spine was performed following the standard protocol without intravenous contrast. Multiplanar CT image reconstructions of the cervical spine were also generated.  COMPARISON:  None.  FINDINGS: CT HEAD FINDINGS  There is mild dilatation of the ventricles and sulci compatible with age-related atrophy. Periventricular and deep white matter hypodensities represent chronic microvascular ischemic changes. Left frontal old infarct and encephalomalacia. There is a small focal area of encephalomalacia changes in the anterior left temporal lobe, likely related to an old insult. There is no intracranial hemorrhage. No mass effect or midline shift identified.  The visualized paranasal sinuses and mastoid air cells are well aerated. A small focal lucency noted in the occiput. The calvarium is intact.  CT CERVICAL SPINE FINDINGS  There is no acute fracture or subluxation of the cervical spine.There is advanced osteopenia. Multilevel degenerative changes.The odontoid and spinous processes are intact.There is normal anatomic alignment of the C1-C2 lateral masses. The visualized soft tissues appear unremarkable.  IMPRESSION: No acute intracranial pathology.  Age-related atrophy  and chronic microvascular ischemic disease. 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  Advanced osteopenia.  No acute/traumatic cervical spine pathology.   Electronically Signed   By: Anner Crete M.D.   On: 05/05/2015 03:58   Ct Cervical Spine Wo Contrast  05/05/2015   CLINICAL DATA:  81 year old female with fall  EXAM: CT HEAD WITHOUT CONTRAST  CT CERVICAL SPINE  WITHOUT CONTRAST  TECHNIQUE: Multidetector CT imaging of the head and cervical spine was performed following the standard protocol without intravenous contrast. Multiplanar CT image reconstructions of the cervical spine were also generated.  COMPARISON:  None.  FINDINGS: CT HEAD FINDINGS  There is mild dilatation of the ventricles and sulci compatible with age-related atrophy. Periventricular and deep white matter hypodensities represent chronic microvascular ischemic changes. Left frontal old infarct and encephalomalacia. There is a small focal area of encephalomalacia changes in the anterior left temporal lobe, likely related to an old insult. There is no intracranial hemorrhage. No mass effect or midline shift identified.  The visualized paranasal sinuses and mastoid air cells are well aerated. A small focal lucency noted in the occiput. The calvarium is intact.  CT CERVICAL SPINE FINDINGS  There is no acute fracture or subluxation of the cervical spine.There is advanced osteopenia. Multilevel degenerative changes.The odontoid and spinous processes are intact.There is normal anatomic alignment of the C1-C2 lateral masses. The visualized soft tissues appear unremarkable.  IMPRESSION: No acute intracranial pathology.  Age-related atrophy and chronic microvascular ischemic disease. 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  Advanced osteopenia.  No acute/traumatic cervical spine pathology.   Electronically Signed   By: Anner Crete M.D.   On: 05/05/2015 03:58   Ct Pelvis Wo Contrast  05/05/2015   CLINICAL DATA:  Left hip  pain after fall. Concern for left hip fracture.  EXAM: CT PELVIS WITHOUT CONTRAST  TECHNIQUE: Multidetector CT imaging of the pelvis was performed following the standard protocol without intravenous contrast.  COMPARISON:  Radiographs earlier this day.  FINDINGS: There is a minimally comminuted and minimally displaced fracture of the intertrochanteric left femur primarily involving the greater trochanter. No extension of the femoral neck. The femoral head is intact. Severe osteoporosis. Right hip arthroplasty in place. Remote fracture of the right and left inferior pubic rami. Bony pelvis is otherwise intact. Urinary bladder is significantly distended.  IMPRESSION: Minimally comminuted and displaced intertrochanteric left hip fracture. The femoral head is intact.   Electronically Signed   By: Jeb Levering M.D.   On: 05/05/2015 06:25   Dg Hip Operative Unilat With Pelvis Left  05/09/2015   CLINICAL DATA:  Left hip fracture.  EXAM: OPERATIVE left HIP (WITH PELVIS IF PERFORMED) 2 VIEWS  TECHNIQUE: Fluoroscopic spot image(s) were submitted for interpretation post-operatively.  COMPARISON:  Radiographs and CT scan dated 05/05/2015  FINDINGS: The patient has undergone open reduction and internal fixation of the proximal left femur fracture. Two screws and and intramedullary nail have been inserted and appear in excellent position. Alignment and position of the fracture fragments is anatomic.  IMPRESSION: Open reduction and internal fixation of left femur fracture.   Electronically Signed   By: Lorriane Shire M.D.   On: 05/09/2015 17:36   Dg Hip Unilat With Pelvis 2-3 Views Left  05/05/2015   CLINICAL DATA:  81 year old female with fall and left hip pain.  EXAM: DG HIP (WITH OR WITHOUT PELVIS) 2-3V LEFT  COMPARISON:  None.  FINDINGS: There is severe osteopenia which limits evaluation of the study for fracture. There is a right total hip arthroplasty. There is a focal area of cortical discontinue the in the medial  portion of the left femoral head, likely artifactual. A cortical fracture is less likely. Clinical correlation is recommended. No definite fracture identified. There is no dislocation on the provided images. CT may provide better evaluation for fracture if clinically indicated.  The soft tissues are grossly unremarkable.  IMPRESSION: Severe osteopenia.  No definite acute fracture or dislocation.  Apparent cortical discontinuity along the medial aspect of the left femoral head is most likely artifactual and less likely represents a cortical fracture. Clinical correlation is recommended.   Electronically Signed   By: Anner Crete M.D.   On: 05/05/2015 03:51    Microbiology: Recent Results (from the past 240 hour(s))  MRSA PCR Screening     Status: None   Collection Time: 05/05/15  7:13 PM  Result Value Ref Range Status   MRSA by PCR NEGATIVE NEGATIVE Final    Comment:        The GeneXpert MRSA Assay (FDA approved for NASAL specimens only), is one component of a comprehensive MRSA colonization surveillance program. It is not intended to diagnose MRSA infection nor to guide or monitor treatment for MRSA infections.   C difficile quick scan w PCR reflex     Status: None   Collection Time: 05/08/15  5:45 AM  Result Value Ref Range Status   C Diff antigen NEGATIVE NEGATIVE Final   C Diff toxin NEGATIVE NEGATIVE Final   C Diff interpretation Negative for toxigenic C. difficile  Final     Labs: Basic Metabolic Panel:  Recent Labs Lab 05/06/15 1921 05/07/15 0225 05/08/15 0319 05/10/15 0417 05/11/15 0308 05/12/15 1002  NA 129* 132* 130* 130* 128* 133*  K 4.0 3.7 4.2 5.7* 4.7 3.7  CL 91* 97* 98* 99* 96* 97*  CO2 31 31 28 23 27 28   GLUCOSE 133* 115* 95 99 142* 128*  BUN 5* <5* <5* 5* 11 9  CREATININE 0.53 0.53 0.46 0.61 0.59 0.55  CALCIUM 8.0* 7.9* 7.9* 8.5* 8.0* 8.1*  MG 2.9* 2.3 1.9  --  1.6* 1.7   Liver Function Tests:  Recent Labs Lab 05/08/15 0319  AST 24  ALT 15   ALKPHOS 68  BILITOT 1.1  PROT 4.8*  ALBUMIN 2.7*   No results for input(s): LIPASE, AMYLASE in the last 168 hours. No results for input(s): AMMONIA in the last 168 hours. CBC:  Recent Labs Lab 05/06/15 0239 05/08/15 0319 05/10/15 0417 05/11/15 0305 05/11/15 1234 05/12/15 1002  WBC 6.5 6.2 13.3* 7.9  --  8.6  NEUTROABS  --   --   --  6.2  --   --   HGB 11.4* 12.4 12.2 9.4* 8.9* 12.9  HCT 32.3* 35.5* 36.1 27.4* 25.1* 37.6  MCV 90.0 91.5 90.9 91.0  --  89.1  PLT PLATELET CLUMPS NOTED ON SMEAR, COUNT APPEARS ADEQUATE 127* 157 149*  --  122*   Cardiac Enzymes: No results for input(s): CKTOTAL, CKMB, CKMBINDEX, TROPONINI in the last 168 hours. BNP: BNP (last 3 results) No results for input(s): BNP in the last 8760 hours.  ProBNP (last 3 results) No results for input(s): PROBNP in the last 8760 hours.  CBG:  Recent Labs Lab 05/06/15 1607 05/06/15 2114 05/07/15 0735 05/07/15 2122 05/08/15 2252  GLUCAP 119* 146* 101* 112* 94       Signed:  Dia Crawford, MD [facility] [phone] pager

## 2015-05-12 NOTE — Progress Notes (Signed)
Pt for discharge to [facility].  CSW facilitated pt discharge needs including contacting facility, faxing pt discharge summary to [facility], notifying pt nephew, Herbie Baltimore via telephone, providing RN phone number to call report, and arranging ambulance transport for pt to [facility].  No further social work needs identified at this time.  CSW signing off.   Alison Murray, MSW, LCSW Clinical Social Work Weekend coverage [phone]

## 2015-05-12 NOTE — Progress Notes (Signed)
Patient ID: Laura Benitez, female   DOB: 05-26-1929, 81 y.o.   MRN: [id number] Vitals and H/H stable post-fixation of left hip fracture.  Dressing clean and intact over small left hip incisions.  Can be up with therapy and assistance with WBAT left hip.

## 2015-05-12 NOTE — Progress Notes (Signed)
Unit 1 PRBC completed, patient given lasix IV as ordered after first unit, no signs of distress, vs stable, no complaints, will continue with 2nd unit PRBC's.

## 2015-05-12 NOTE — Progress Notes (Signed)
Second unit PRBC completed without adverse reactions, vital sign stable, no complaints or discomforts voiced.

## 2015-05-13 LAB — TYPE AND SCREEN
ABO/RH(D): O POS
ANTIBODY SCREEN: NEGATIVE
Unit division: 0
Unit division: 0

## 2015-05-14 ENCOUNTER — Non-Acute Institutional Stay: Payer: Medicare Other | Admitting: Nurse Practitioner

## 2015-05-14 ENCOUNTER — Encounter: Payer: Self-pay | Admitting: Nurse Practitioner

## 2015-05-14 ENCOUNTER — Encounter: Payer: Self-pay | Admitting: Internal Medicine

## 2015-05-14 ENCOUNTER — Non-Acute Institutional Stay (SKILLED_NURSING_FACILITY): Payer: Medicare Other | Admitting: Internal Medicine

## 2015-05-14 DIAGNOSIS — L8990 Pressure ulcer of unspecified site, unspecified stage: Secondary | ICD-10-CM | POA: Diagnosis not present

## 2015-05-14 DIAGNOSIS — E871 Hypo-osmolality and hyponatremia: Secondary | ICD-10-CM | POA: Diagnosis not present

## 2015-05-14 DIAGNOSIS — I739 Peripheral vascular disease, unspecified: Secondary | ICD-10-CM | POA: Diagnosis not present

## 2015-05-14 DIAGNOSIS — I272 Pulmonary hypertension, unspecified: Secondary | ICD-10-CM

## 2015-05-14 DIAGNOSIS — N318 Other neuromuscular dysfunction of bladder: Secondary | ICD-10-CM

## 2015-05-14 DIAGNOSIS — L89153 Pressure ulcer of sacral region, stage 3: Secondary | ICD-10-CM | POA: Insufficient documentation

## 2015-05-14 DIAGNOSIS — G309 Alzheimer's disease, unspecified: Secondary | ICD-10-CM

## 2015-05-14 DIAGNOSIS — R1314 Dysphagia, pharyngoesophageal phase: Secondary | ICD-10-CM | POA: Diagnosis not present

## 2015-05-14 DIAGNOSIS — M25552 Pain in left hip: Secondary | ICD-10-CM

## 2015-05-14 DIAGNOSIS — E876 Hypokalemia: Secondary | ICD-10-CM

## 2015-05-14 DIAGNOSIS — M810 Age-related osteoporosis without current pathological fracture: Secondary | ICD-10-CM

## 2015-05-14 DIAGNOSIS — F329 Major depressive disorder, single episode, unspecified: Secondary | ICD-10-CM

## 2015-05-14 DIAGNOSIS — K219 Gastro-esophageal reflux disease without esophagitis: Secondary | ICD-10-CM

## 2015-05-14 DIAGNOSIS — Z66 Do not resuscitate: Secondary | ICD-10-CM

## 2015-05-14 DIAGNOSIS — F0280 Dementia in other diseases classified elsewhere without behavioral disturbance: Secondary | ICD-10-CM

## 2015-05-14 DIAGNOSIS — F32A Depression, unspecified: Secondary | ICD-10-CM

## 2015-05-14 DIAGNOSIS — E039 Hypothyroidism, unspecified: Secondary | ICD-10-CM

## 2015-05-14 DIAGNOSIS — D62 Acute posthemorrhagic anemia: Secondary | ICD-10-CM

## 2015-05-14 DIAGNOSIS — S72002A Fracture of unspecified part of neck of left femur, initial encounter for closed fracture: Secondary | ICD-10-CM

## 2015-05-14 HISTORY — DX: Dysphagia, pharyngoesophageal phase: R13.14

## 2015-05-14 NOTE — Assessment & Plan Note (Signed)
Continue Actonel 150mg  daily, Cal vit D bid

## 2015-05-14 NOTE — Assessment & Plan Note (Addendum)
Post op Hgb 12.9 05/12/15, update CBC, -9/23 Transfuse 2 units PRBC

## 2015-05-14 NOTE — Assessment & Plan Note (Signed)
Her mood is stable, continue Sertraline 100mg  daily.

## 2015-05-14 NOTE — Assessment & Plan Note (Signed)
Continue mechanical soft, chopped meat, honey thick liquids.

## 2015-05-14 NOTE — Assessment & Plan Note (Signed)
Stable, continue Omeprazole 40mg daily.  

## 2015-05-14 NOTE — Assessment & Plan Note (Signed)
-  Patient's EF within normal range -Strict I&O; + 12.5 L

## 2015-05-14 NOTE — Assessment & Plan Note (Addendum)
-  Patient with slight hyperkalemia upon hospital discharge.  DC'd fluid with potassium in it, K 3.7 05/12/15, will update CMP

## 2015-05-14 NOTE — Assessment & Plan Note (Signed)
Left hip is managed with Norco prn.

## 2015-05-14 NOTE — Assessment & Plan Note (Signed)
Na 133 05/12/15, update serum Na

## 2015-05-14 NOTE — Assessment & Plan Note (Signed)
Mid spine and left buttock, pressure reduction.

## 2015-05-14 NOTE — Assessment & Plan Note (Signed)
Continue Aricept. Last MMSE 17/30 05/04/15

## 2015-05-14 NOTE — Assessment & Plan Note (Signed)
Continue Myrbetriq 

## 2015-05-14 NOTE — Progress Notes (Signed)
lPatient ID: Laura Benitez, female   DOB: [birth year], 81 y.o.   MRN: [id number]  Location:  SNF [facility] Provider:  Marlana Latus NP  Code Status:  DNR Goals of care: Advanced Directive information    Chief Complaint  Patient presents with  . Medical Management of Chronic Issues  . Hospitalization Follow-up     HPI: Patient is a 81 y.o. female seen in the SNF at [facility] today for evaluation of f/p hospital stay from 05/05/2015 to 05/12/2015 for L hip fx repair s/p fall, (orthopedic surgery) in 2 weeks -9/23 Transfuse 2 units PRBC  Review of Systems:  Review of Systems  Constitutional: Negative for fever, chills and diaphoresis.  HENT: Positive for hearing loss. Negative for congestion, ear discharge and sore throat.   Eyes: Negative for photophobia, pain, discharge and redness.  Respiratory: Positive for shortness of breath (exertional. ). Negative for cough and wheezing.        O2 [place]  Cardiovascular: Positive for leg swelling. Negative for chest pain and palpitations.       Trace edema LLE  Gastrointestinal: Negative for nausea, vomiting, abdominal pain, diarrhea and constipation.       Hx GERD.  Genitourinary: Negative for dysuria, urgency, frequency, hematuria and flank pain.       Sleeps through night since Myrbetriq  Musculoskeletal: Positive for joint pain. Negative for myalgias, back pain and neck pain.       Left hip pain.   Skin: Negative for rash.       Pigmented BLE. Mid spine pressure area, left buttock pressure areas. Left hip surgical incision intact  Neurological: Negative for dizziness, tremors, seizures, weakness and headaches.       MEMORY LOSS  Endo/Heme/Allergies: Negative for environmental allergies. Does not bruise/bleed easily.  Psychiatric/Behavioral: Positive for memory loss. Negative for hallucinations. The patient is not nervous/anxious.     Past Medical History  Diagnosis Date  . Anemia   . Depression   . Anxiety   . Fracture of lumbar spine      History in 2000  . Allergy   . Migraine headache   . GERD (gastroesophageal reflux disease)   . UTI (urinary tract infection)   . Otalgia, unspecified 10/14/2012  . Stage I pressure ulcer of back 10/14/2012  . Shortness of breath 10/14/2012  . 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 Chronic fatigue syndrome 02/09/2012  . Abnormality of gait 01/30/2012  . Senile osteoporosis 12/25/2011  . Congestive heart failure, unspecified 12/10/2011  . Acute bronchitis 12/04/2011  . Closed fracture of lateral malleolus 11/27/2011  . Edema 10/30/2011  . Peripheral vascular disease, unspecified 10/20/2011  . Urinary frequency 09/25/2011  . Throat pain 08/13/2011  . Candidiasis of other urogenital sites 07/21/2011  . Unspecified hypothyroidism 07/21/2011  . Personality change due to conditions classified elsewhere 07/21/2011  . Cracked tooth 07/21/2011  . Candidiasis of mouth 07/16/2011  . Neoplasm of uncertain behavior of skin 07/16/2011  . Lumbago 07/16/2011  . Dysphagia 07/16/2011  . Hyposmolality and/or hyponatremia 07/15/2011  . Acute posthemorrhagic anemia 07/15/2011  . Major depressive disorder, single episode, unspecified 07/15/2011  . Unspecified constipation 07/15/2011  . Pain in joint, pelvic region and thigh 07/15/2011  . Other closed fractures of distal end of radius (alone) 07/15/2011  . Closed fracture of distal end of ulna (alone) 07/15/2011  . Other orthopedic aftercare(V54.89) 07/15/2011  . CAD (coronary artery disease) 01/11/2015  . PAD (peripheral artery disease) 01/11/2015  . Alzheimer's disease 05/03/2015  Patient Active Problem List   Diagnosis Date Noted  . Osteoporosis 05/14/2015  . Dysphagia, pharyngoesophageal phase 05/14/2015  . Acute blood loss anemia   . Pulmonary hypertension   . Tricuspid valve regurgitation   . Protein-calorie malnutrition, severe 05/09/2015  . CAD in native artery   . Closed left hip fracture   . Pressure ulcer 05/06/2015  . Hypokalemia 05/05/2015    . Hyponatremia 05/05/2015  . Fall at nursing home 05/05/2015  . Thrombocytopenia 05/05/2015  . Left hip pain 05/05/2015  . Hypotension 05/05/2015  . Hip fracture 05/05/2015  . Fall   . Alzheimer's disease 05/03/2015  . Hip pain 01/11/2015  . CAD (coronary artery disease) 01/11/2015  . PAD (peripheral artery disease) 01/11/2015  . Malaise 01/26/2014  . H/O carotid atherosclerosis 10/29/2013  . Hypothyroidism 01/06/2013  . 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 GERD (gastroesophageal reflux disease) 01/06/2013  . DNR (do not resuscitate) 01/06/2013  . Venous insufficiency 12/17/2011  . Constipation 09/11/2011  . Depression 07/10/2011  . Anxiety 07/10/2011    Allergies  Allergen Reactions  . Milk-Related Compounds Other (See Comments)    Milk products cause patient to have migraine headaches.  Pt can tolerate butter.    Medications: Patient's Medications  New Prescriptions   No medications on file  Previous Medications   ACTONEL 150 MG TABLET    Take 150 mg by mouth every 30 (thirty) days. Take  1 tablet once a month on the 29th of the month.   ALUM & MAG HYDROXIDE-SIMETH (MINTOX) 062-694-85 MG/5ML SUSPENSION    Take 30 mLs by mouth 3 (three) times daily. Take 30 ml three times daily as needed for indigestion.   CALCIUM-VITAMIN D (OSCAL WITH D) 500-200 MG-UNIT PER TABLET    Take 1 tablet by mouth 2 (two) times daily.   DONEPEZIL (ARICEPT) 5 MG TABLET    One nightly to help preserve memory   ENOXAPARIN (LOVENOX) 30 MG/0.3ML INJECTION    Inject 0.3 mLs (30 mg total) into the skin daily.   FLUTICASONE (FLONASE) 50 MCG/ACT NASAL SPRAY    Place 2 sprays into the nose daily. Daily   HYDROCODONE-ACETAMINOPHEN (NORCO) 7.5-325 MG PER TABLET    Take 1-2 tablets by mouth every 6 (six) hours as needed for moderate pain.   LEVOTHYROXINE (SYNTHROID, LEVOTHROID) 50 MCG TABLET    Take 50 mcg by mouth daily. Take 1 tablet by mouth daily.   LOPERAMIDE (IMODIUM) 2 MG CAPSULE    Take 2-4 mg by  mouth 3 (three) times daily as needed for diarrhea or loose stools.   MALTODEXTRIN-XANTHAN GUM (RESOURCE THICKENUP CLEAR) POWD    Follow directions on package for nectar thick liquids   MYRBETRIQ 25 MG TB24    Take 25 mg by mouth daily. Take one tablet daily   OMEPRAZOLE (PRILOSEC) 40 MG CAPSULE    Take 40 mg by mouth daily.   POLYETHYL GLYCOL-PROPYL GLYCOL (SYSTANE) 0.4-0.3 % SOLN    Apply 1 drop to eye every 4 (four) hours as needed (for eye discomfort). Both eyes as needed for comfort   PROTEIN SUPPLEMENT (RESOURCE BENEPROTEIN) POWD    Take 1 scoop by mouth daily. MIX 1 SCOOP IN 4 OZ. OF HONEY THICK H20 AND TAKE BY MOUTH AT 2 PM.   PSYLLIUM (METAMUCIL) 58.6 % POWDER    Take 1 packet by mouth daily as needed (for constipation). Take teaspoonful mixed in 8oz fluide daily as needed   SERTRALINE (ZOLOFT) 100 MG TABLET    One  daily to help depression and anxiety   SIMETHICONE (MYLICON) 384 MG CHEWABLE TABLET    Chew 125 mg by mouth 3 (three) times daily as needed for flatulence.   SODIUM CHLORIDE (DEEP SEA NASAL SPRAY) 0.65 % NASAL SPRAY    Place 1 spray into the nose. Use 1 spray in each nostril as needed   VITAMIN A 66599 UNIT CAPSULE    Take 10,000 Units by mouth daily.  Modified Medications   No medications on file  Discontinued Medications   No medications on file    Physical Exam: Filed Vitals:   05/14/15 1048  BP: 104/62  Pulse: 81  Temp: 98.6 F (37 C)  TempSrc: Tympanic  Resp: 14   There is no weight on file to calculate BMI.  Physical Exam  Constitutional: She is oriented to person, place, and time. She appears well-developed and well-nourished. No distress.  HENT:  Head: Normocephalic and atraumatic.  Right Ear: External ear normal.  Left Ear: External ear normal.  Mouth/Throat: Oropharynx is clear and moist.  Eyes: Conjunctivae and EOM are normal. Pupils are equal, round, and reactive to light. Right eye exhibits no discharge. Left eye exhibits no discharge. No scleral  icterus.  Neck: Normal range of motion. No JVD present. No tracheal deviation present. No thyromegaly present.  Cardiovascular: Normal rate, regular rhythm and normal heart sounds.  Exam reveals no friction rub.   No murmur heard. Absent DP and PT bilaterally. Chronic venous insufficiency.  Pulmonary/Chest: Effort normal. No stridor. No respiratory distress. She has no wheezes. She has no rales. She exhibits no tenderness.  Abdominal: Soft. Bowel sounds are normal. She exhibits no distension and no mass. There is no tenderness. There is no guarding.  Musculoskeletal: She exhibits edema (BLE--trace. ) and tenderness (lower legs bilaterally).  Ambulates with walker. Severe kyphosis. Trace edema LLE. Dependent rubor.  Lymphadenopathy:    She has no cervical adenopathy.  Neurological: She is alert and oriented to person, place, and time. She has normal reflexes. She displays normal reflexes. No cranial nerve deficit. She exhibits normal muscle tone. Coordination normal.  05/03/2015 MMSE 18/30. Clock drawing poorly centered.  Skin: Skin is warm and dry. She is not diaphoretic.  BLE pigmented change and dependent rubor seen. L hip surgical incision intact. Pressure areas mid spine and left buttock.   Psychiatric: She has a normal mood and affect. Her behavior is normal. Judgment and thought content normal.    Labs reviewed: Basic Metabolic Panel:  Recent Labs  05/08/15 0319 05/10/15 0417 05/11/15 0308 05/12/15 1002  NA 130* 130* 128* 133*  K 4.2 5.7* 4.7 3.7  CL 98* 99* 96* 97*  CO2 28 23 27 28   GLUCOSE 95 99 142* 128*  BUN <5* 5* 11 9  CREATININE 0.46 0.61 0.59 0.55  CALCIUM 7.9* 8.5* 8.0* 8.1*  MG 1.9  --  1.6* 1.7    Liver Function Tests:  Recent Labs  03/13/15 05/05/15 0233 05/08/15 0319  AST 16 39 24  ALT 9 22 15   ALKPHOS 69 78 68  BILITOT  --  1.2 1.1  PROT  --  5.8* 4.8*  ALBUMIN  --  3.5 2.7*    CBC:  Recent Labs  05/05/15 0233  05/10/15 0417 05/11/15 0305  05/11/15 1234 05/12/15 1002  WBC 8.8  < > 13.3* 7.9  --  8.6  NEUTROABS 7.7  --   --  6.2  --   --   HGB 13.0  < > 12.2  9.4* 8.9* 12.9  HCT 36.2  < > 36.1 27.4* 25.1* 37.6  MCV 88.1  < > 90.9 91.0  --  89.1  PLT 123*  < > 157 149*  --  122*  < > = values in this interval not displayed.  Lab Results  Component Value Date   TSH 2.22 10/19/2014   No results found for: HGBA1C No results found for: CHOL, HDL, LDLCALC, LDLDIRECT, TRIG, CHOLHDL  Significant Diagnostic Results since last visit: none  Patient Care Team: [name], MD as PCP - General (Internal Medicine) Altamese , MD as Consulting Physician (Orthopedic Surgery) Sydnee Levans, MD as Consulting Physician (Dermatology) Griselda Miner, MD as Consulting Physician (Dermatology)  Assessment/Plan Problem List Items Addressed This Visit    Pulmonary hypertension    -Patient's EF within normal range -Strict I&O; + 12.5 L      Pressure ulcer    Mid spine and left buttock, pressure reduction.       Osteoporosis    Continue Actonel 150mg  daily, Cal vit D bid      Hypothyroidism (Chronic)    Continue Levothyroxine 45mcg, TSH 1.866 11/01/13. Update TSH      Hyponatremia    Na 133 05/12/15, update serum Na      Hypokalemia    -Patient with slight hyperkalemia upon hospital discharge.  DC'd fluid with potassium in it, K 3.7 05/12/15, will update CMP      Hypertonicity of bladder    Continue Myrbetriq.       Hip pain - Primary    Left hip is managed with Norco prn.       GERD (gastroesophageal reflux disease)    Stable, continue Omeprazole 40mg  daily.       Dysphagia, pharyngoesophageal phase    Continue mechanical soft, chopped meat, honey thick liquids.       Depression (Chronic)    Her mood is stable, continue Sertraline 100mg  daily.       Alzheimer's disease (Chronic)    Continue Aricept. Last MMSE 17/30 05/04/15      Acute blood loss anemia    Post op Hgb 12.9 05/12/15, update CBC,  -9/23 Transfuse 2 units PRBC          Family/ staff Communication: Rehab, [place] is to return [place] when [place] is able.   Labs/tests ordered:  CBC, CMP, TSH  ManXie Mast NP [facility] [address], [place] [address] On Call:  [phone] & follow prompts after 5pm & weekends Office Phone:  [phone] Office Fax:  [phone]

## 2015-05-14 NOTE — Progress Notes (Signed)
Patient ID: Laura Benitez, female   DOB: 21-Aug-1928, 81 y.o.   MRN: [id number]    HISTORY AND PHYSICAL  Location:  [place] Room Number: 4B Place of Service: SNF (31)   Extended Emergency Contact Information Primary Emergency Contact: [place] Phone: [phone] Mobile Phone: [phone] Relation: Friend Secondary Emergency Contact: Croteau,Robert  [place] Work Phone: [phone] Mobile Phone: [phone] Relation: Nephew  Advanced Directive information Does patient have an advance directive?: Yes, Type of Advance Directive: Healthcare Power of Attorney, Pre-existing out of facility DNR order (yellow form or pink MOST form): Yellow form placed in chart (order not valid for inpatient use), Does patient want to make changes to advanced directive?: No - Patient declined  Chief Complaint  Patient presents with  . New Admit To SNF    following hospitalization  . Acute Visit    for dysphagia    HPI:  Permitted to skilled nursing facility 05/12/2015 following hospitalization from 05/05/2015 through 05/12/2015. Patient had a fall and a closed fracture of the left hip as a result. Dr. Erlinda Hong did surgery 05/09/2015 with the med intramedullary implant.  She had postoperative blood loss and ultimately required 2 units of blood transfusion.  Dementia is present as a long-term factor. Most likely Alzheimer's disease.  She also manifests dysphagia and was seen acutely today for choking and coughing following swallowing. Patient was discharged on thickened liquids and should remain on this. She had a clinical/bedside swallowing evaluation by speech pathology on 05/06/2015. Oral delays across all consistencies were noted. Recommendations were made for dysphagia 3 texture and nectar liquids,.  Other problems are of hyponatremia and hypokalemia. There are multiple other chronic problems on her past medical history. Most  of which are stable at present time.  Past Medical History  Diagnosis Date  . Anemia   . Depression   . Anxiety   . Fracture of lumbar spine     History in 2000  . Allergy   . Migraine headache   . GERD (gastroesophageal reflux disease)   . UTI (urinary tract infection)   . Otalgia, unspecified 10/14/2012  . Stage I pressure ulcer of back 10/14/2012  . Shortness of breath 10/14/2012  . 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 Chronic fatigue syndrome 02/09/2012  . Abnormality of gait 01/30/2012  . Senile osteoporosis 12/25/2011  . Congestive heart failure, unspecified 12/10/2011  . Acute bronchitis 12/04/2011  . Closed fracture of lateral malleolus 11/27/2011  . Edema 10/30/2011  . Peripheral vascular disease, unspecified 10/20/2011  . Urinary frequency 09/25/2011  . Throat pain 08/13/2011  . Candidiasis of other urogenital sites 07/21/2011  . Unspecified hypothyroidism 07/21/2011  . Personality change due to conditions classified elsewhere 07/21/2011  . Cracked tooth 07/21/2011  . Candidiasis of mouth 07/16/2011  . Neoplasm of uncertain behavior of skin 07/16/2011  . Lumbago 07/16/2011  . Dysphagia 07/16/2011  . Hyposmolality and/or hyponatremia 07/15/2011  . Acute posthemorrhagic anemia 07/15/2011  . Major depressive disorder, single episode, unspecified 07/15/2011  . Unspecified constipation 07/15/2011  . Pain in joint, pelvic region and thigh 07/15/2011  . Other closed fractures of distal end of radius (alone) 07/15/2011  . Closed fracture of distal end of ulna (alone) 07/15/2011  . Other orthopedic aftercare(V54.89) 07/15/2011  . CAD (coronary artery disease) 01/11/2015  . PAD (peripheral artery disease) 01/11/2015  . Alzheimer's disease 05/03/2015  . Acute blood loss anemia   . Dysphagia, pharyngoesophageal phase 05/14/2015  .  Fall     Past Surgical History  Procedure Laterality Date  . Tonsillectomy      @age  4  . Total abdominal hysterectomy    . Hip arthroplasty  07/10/2011     Procedure: ARTHROPLASTY BIPOLAR HIP;  Surgeon: Rozanna Box, MD;  Location: [facility];  Service: Orthopedics;  Laterality: Right;  . Orif wrist fracture  07/10/2011    Procedure: OPEN REDUCTION INTERNAL FIXATION (ORIF) WRIST FRACTURE;  Surgeon: Rozanna Box, MD;  Location: [facility];  Service: Orthopedics;  Laterality: Right;  . Femur im nail Left 05/09/2015    Procedure: INTRAMEDULLARY (IM) NAIL FEMORAL;  Surgeon: Leandrew Koyanagi, MD;  Location: [place];  Service: Orthopedics;  Laterality: Left;    Patient Care Team: [name], MD as PCP - General (Internal Medicine) Altamese Marble [place], MD as Consulting Physician (Orthopedic Surgery) Sydnee Levans, MD as Consulting Physician (Dermatology) Griselda Miner, MD as Consulting Physician (Dermatology)  Social History   Social History  . Marital Status: Single    Spouse Name: N/A  . Number of Children: 0  . Years of Education: N/A   Occupational History  . retired Pharmacist, hospital    Social History Main Topics  . Smoking status: Never Smoker   . Smokeless tobacco: Never Used  . Alcohol Use: No  . Drug Use: No  . Sexual Activity: Not Currently   Other Topics Concern  . Not on file   Social History Narrative   L[facility] at [facility]    reports that she has never smoked. She has never used smokeless tobacco. She reports that she does not drink alcohol or use illicit drugs.  Family History  Problem Relation Age of Onset  . Lung cancer Father   . Colon cancer Neg Hx    Family Status  Relation Status Death Age  . Mother Deceased   . Father Deceased     Immunization History  Administered Date(s) Administered  . Influenza-Unspecified 06/23/2013, 06/22/2014  . Pneumococcal Polysaccharide-23 09/08/2013    Allergies  Allergen Reactions  . Milk-Related Compounds Other (See Comments)    Milk products cause patient to have migraine headaches.  Pt can tolerate butter.    Medications: Patient's Medications  New Prescriptions     No medications on file  Previous Medications   ACTONEL 150 MG TABLET    Take 150 mg by mouth every 30 (thirty) days. Take  1 tablet once a month on the 29th of the month.   ALUM & MAG HYDROXIDE-SIMETH (MINTOX) 578-469-62 MG/5ML SUSPENSION    Take 30 mLs by mouth 3 (three) times daily. Take 30 ml three times daily as needed for indigestion.   CALCIUM-VITAMIN D (OSCAL WITH D) 500-200 MG-UNIT PER TABLET    Take 1 tablet by mouth 2 (two) times daily.   DONEPEZIL (ARICEPT) 5 MG TABLET    One nightly to help preserve memory   ENOXAPARIN (LOVENOX) 30 MG/0.3ML INJECTION    Inject 0.3 mLs (30 mg total) into the skin daily.   FLUTICASONE (FLONASE) 50 MCG/ACT NASAL SPRAY    Place 2 sprays into the nose daily. Daily   HYDROCODONE-ACETAMINOPHEN (NORCO) 7.5-325 MG PER TABLET    Take 1-2 tablets by mouth every 6 (six) hours as needed for moderate pain.   LEVOTHYROXINE (SYNTHROID, LEVOTHROID) 50 MCG TABLET    Take 50 mcg by mouth daily. Take 1 tablet by mouth daily.   LOPERAMIDE (IMODIUM) 2 MG CAPSULE    Take 2-4 mg by mouth 3 (  three) times daily as needed for diarrhea or loose stools.   MALTODEXTRIN-XANTHAN GUM (RESOURCE THICKENUP CLEAR) POWD    Follow directions on package for nectar thick liquids   MYRBETRIQ 25 MG TB24    Take 25 mg by mouth daily. Take one tablet daily   OMEPRAZOLE (PRILOSEC) 40 MG CAPSULE    Take 40 mg by mouth daily.   POLYETHYL GLYCOL-PROPYL GLYCOL (SYSTANE) 0.4-0.3 % SOLN    Apply 1 drop to eye every 4 (four) hours as needed (for eye discomfort). Both eyes as needed for comfort   PROTEIN SUPPLEMENT (RESOURCE BENEPROTEIN) POWD    Take 1 scoop by mouth daily. MIX 1 SCOOP IN 4 OZ. OF HONEY THICK H20 AND TAKE BY MOUTH AT 2 PM.   PSYLLIUM (METAMUCIL) 58.6 % POWDER    Take 1 packet by mouth daily as needed (for constipation). Take teaspoonful mixed in 8oz fluide daily as needed   SERTRALINE (ZOLOFT) 100 MG TABLET    One daily to help depression and anxiety   SIMETHICONE (MYLICON) 161 MG  CHEWABLE TABLET    Chew 125 mg by mouth 3 (three) times daily as needed for flatulence.   SODIUM CHLORIDE (DEEP SEA NASAL SPRAY) 0.65 % NASAL SPRAY    Place 1 spray into the nose. Use 1 spray in each nostril as needed   VITAMIN A 09604 UNIT CAPSULE    Take 10,000 Units by mouth daily.  Modified Medications   No medications on file  Discontinued Medications   No medications on file    Review of Systems  Constitutional: Positive for fatigue. Negative for fever, chills and diaphoresis.       Frail. Thin.  HENT: Positive for hearing loss. Negative for congestion, ear discharge and sore throat.   Eyes: Negative for photophobia, pain, discharge and redness.  Respiratory: Positive for chest tightness and shortness of breath (exertional. ). Negative for cough and wheezing.   Cardiovascular: Positive for leg swelling. Negative for chest pain and palpitations.       Trace edema BLE  Gastrointestinal: Negative for nausea, vomiting, abdominal pain, diarrhea and constipation.       Hx GERD.  Genitourinary: Negative for dysuria, urgency, frequency, hematuria and flank pain.       Sleeps through night since Myrbetriq  Musculoskeletal: Positive for gait problem. Negative for myalgias, back pain and neck pain.       Hospitalization 05/05/2015 4 left hip fracture following a fall.  Skin: Positive for rash.       Pigmented BLE.   Allergic/Immunologic: Negative for environmental allergies.  Neurological: Negative for dizziness, tremors, seizures, weakness and headaches.       MEMORY LOSS most likely secondary to Alzheimer's  Hematological: Does not bruise/bleed easily.  Psychiatric/Behavioral: Positive for confusion. Negative for hallucinations. The patient is not nervous/anxious.     Filed Vitals:   05/14/15 1557  BP: 104/62  Pulse: 81  Temp: 98.6 F (37 C)  Height: 5' (1.524 m)  Weight: 95 lb (43.092 kg)   Body mass index is 18.55 kg/(m^2).  Physical Exam  Constitutional: She is oriented to  person, place, and time. She appears well-developed and well-nourished. No distress.  HENT:  Head: Normocephalic and atraumatic.  Right Ear: External ear normal.  Left Ear: External ear normal.  Mouth/Throat: Oropharynx is clear and moist.  Eyes: Conjunctivae and EOM are normal. Pupils are equal, round, and reactive to light. Right eye exhibits no discharge. Left eye exhibits no discharge. No scleral icterus.  Neck: Normal range of motion. No JVD present. No tracheal deviation present. No thyromegaly present.  Cardiovascular: Normal rate, regular rhythm and normal heart sounds.  Exam reveals no friction rub.   No murmur heard. Absent DP and PT bilaterally. Chronic venous insufficiency.  Pulmonary/Chest: Effort normal. No stridor. No respiratory distress. She has no wheezes. She has no rales. She exhibits no tenderness.  Abdominal: Soft. Bowel sounds are normal. She exhibits no distension and no mass. There is no tenderness. There is no guarding.  Musculoskeletal: She exhibits edema (BLE--trace. ) and tenderness (lower legs bilaterally).  Ambulates with walker. Severe kyphosis. Trace edema BLE. Dependent rubor. Recent incision of the left hip is clean and without peripheral bruising. Diagnosis osteoporosis and was put on Actonel during hospital stay.  Lymphadenopathy:    She has no cervical adenopathy.  Neurological: She is alert and oriented to person, place, and time. She has normal reflexes. She displays normal reflexes. No cranial nerve deficit. She exhibits normal muscle tone. Coordination normal.  05/03/2015 MMSE 18/30. Clock drawing poorly centered.  Skin: Skin is warm and dry. She is not diaphoretic.  BLE pigmented change and dependent rubor seen. R+L buttock rash. Sacral decubitus.  Psychiatric:  History of anxiety. Previously on lorazepam, but this was discontinued during hospital stay    Labs reviewed: Lab Summary Latest Ref Rng 05/12/2015 05/11/2015 05/11/2015 05/11/2015 05/10/2015  05/08/2015 05/07/2015  Hemoglobin 12.0 - 15.0 g/dL 12.9 8.9(L) (None) 9.4(L) 12.2 12.4 (None)  Hematocrit 36.0 - 46.0 % 37.6 25.1(L) (None) 27.4(L) 36.1 35.5(L) (None)  White count 4.0 - 10.5 K/uL 8.6 (None) (None) 7.9 13.3(H) 6.2 (None)  Platelet count 150 - 400 K/uL 122(L) (None) (None) 149(L) 157 127(L) (None)  Sodium 135 - 145 mmol/L 133(L) (None) 128(L) (None) 130(L) 130(L) 132(L)  Potassium 3.5 - 5.1 mmol/L 3.7 (None) 4.7 (None) 5.7(H) 4.2 3.7  Calcium 8.9 - 10.3 mg/dL 8.1(L) (None) 8.0(L) (None) 8.5(L) 7.9(L) 7.9(L)  Phosphorus - (None) (None) (None) (None) (None) (None) (None)  Creatinine 0.44 - 1.00 mg/dL 0.55 (None) 0.59 (None) 0.61 0.46 0.53  AST 15 - 41 U/L (None) (None) (None) (None) (None) 24 (None)  Alk Phos 38 - 126 U/L (None) (None) (None) (None) (None) 68 (None)  Bilirubin 0.3 - 1.2 mg/dL (None) (None) (None) (None) (None) 1.1 (None)  Glucose 65 - 99 mg/dL 128(H) (None) 142(H) (None) 99 95 115(H)  Cholesterol - (None) (None) (None) (None) (None) (None) (None)  HDL cholesterol - (None) (None) (None) (None) (None) (None) (None)  Triglycerides - (None) (None) (None) (None) (None) (None) (None)  LDL Direct - (None) (None) (None) (None) (None) (None) (None)  LDL Calc - (None) (None) (None) (None) (None) (None) (None)  Total protein 6.5 - 8.1 g/dL (None) (None) (None) (None) (None) 4.8(L) (None)  Albumin 3.5 - 5.0 g/dL (None) (None) (None) (None) (None) 2.7(L) (None)   Lab Results  Component Value Date   BUN 9 05/12/2015   No results found for: HGBA1C Lab Results  Component Value Date   TSH 2.22 10/19/2014          Ct Head Wo Contrast  05/05/2015   CLINICAL DATA:  81 year old female with fall  EXAM: CT HEAD WITHOUT CONTRAST  CT CERVICAL SPINE WITHOUT CONTRAST  TECHNIQUE: Multidetector CT imaging of the head and cervical spine was performed following the standard protocol without intravenous contrast. Multiplanar CT image reconstructions of the cervical spine were also  generated.  COMPARISON:  None.  FINDINGS: CT HEAD FINDINGS  There is mild  dilatation of the ventricles and sulci compatible with age-related atrophy. Periventricular and deep white matter hypodensities represent chronic microvascular ischemic changes. Left frontal old infarct and encephalomalacia. There is a small focal area of encephalomalacia changes in the anterior left temporal lobe, likely related to an old insult. There is no intracranial hemorrhage. No mass effect or midline shift identified.  The visualized paranasal sinuses and mastoid air cells are well aerated. A small focal lucency noted in the occiput. The calvarium is intact.  CT CERVICAL SPINE FINDINGS  There is no acute fracture or subluxation of the cervical spine.There is advanced osteopenia. Multilevel degenerative changes.The odontoid and spinous processes are intact.There is normal anatomic alignment of the C1-C2 lateral masses. The visualized soft tissues appear unremarkable.  IMPRESSION: No acute intracranial pathology.  Age-related atrophy and chronic microvascular ischemic disease. 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  Advanced osteopenia.  No acute/traumatic cervical spine pathology.   Electronically Signed   By: Anner Crete M.D.   On: 05/05/2015 03:58   Ct Cervical Spine Wo Contrast  05/05/2015   CLINICAL DATA:  81 year old female with fall  EXAM: CT HEAD WITHOUT CONTRAST  CT CERVICAL SPINE WITHOUT CONTRAST  TECHNIQUE: Multidetector CT imaging of the head and cervical spine was performed following the standard protocol without intravenous contrast. Multiplanar CT image reconstructions of the cervical spine were also generated.  COMPARISON:  None.  FINDINGS: CT HEAD FINDINGS  There is mild dilatation of the ventricles and sulci compatible with age-related atrophy. Periventricular and deep white matter hypodensities represent chronic microvascular ischemic changes. Left frontal old infarct and encephalomalacia. There  is a small focal area of encephalomalacia changes in the anterior left temporal lobe, likely related to an old insult. There is no intracranial hemorrhage. No mass effect or midline shift identified.  The visualized paranasal sinuses and mastoid air cells are well aerated. A small focal lucency noted in the occiput. The calvarium is intact.  CT CERVICAL SPINE FINDINGS  There is no acute fracture or subluxation of the cervical spine.There is advanced osteopenia. Multilevel degenerative changes.The odontoid and spinous processes are intact.There is normal anatomic alignment of the C1-C2 lateral masses. The visualized soft tissues appear unremarkable.  IMPRESSION: No acute intracranial pathology.  Age-related atrophy and chronic microvascular ischemic disease. 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  Advanced osteopenia.  No acute/traumatic cervical spine pathology.   Electronically Signed   By: Anner Crete M.D.   On: 05/05/2015 03:58   Ct Pelvis Wo Contrast  05/05/2015   CLINICAL DATA:  Left hip pain after fall. Concern for left hip fracture.  EXAM: CT PELVIS WITHOUT CONTRAST  TECHNIQUE: Multidetector CT imaging of the pelvis was performed following the standard protocol without intravenous contrast.  COMPARISON:  Radiographs earlier this day.  FINDINGS: There is a minimally comminuted and minimally displaced fracture of the intertrochanteric left femur primarily involving the greater trochanter. No extension of the femoral neck. The femoral head is intact. Severe osteoporosis. Right hip arthroplasty in place. Remote fracture of the right and left inferior pubic rami. Bony pelvis is otherwise intact. Urinary bladder is significantly distended.  IMPRESSION: Minimally comminuted and displaced intertrochanteric left hip fracture. The femoral head is intact.   Electronically Signed   By: Jeb Levering M.D.   On: 05/05/2015 06:25   Dg Hip Operative Unilat With Pelvis Left  05/09/2015   CLINICAL  DATA:  Left hip fracture.  EXAM: OPERATIVE left HIP (WITH PELVIS IF PERFORMED) 2 VIEWS  TECHNIQUE: Fluoroscopic  spot image(s) were submitted for interpretation post-operatively.  COMPARISON:  Radiographs and CT scan dated 05/05/2015  FINDINGS: The patient has undergone open reduction and internal fixation of the proximal left femur fracture. Two screws and and intramedullary nail have been inserted and appear in excellent position. Alignment and position of the fracture fragments is anatomic.  IMPRESSION: Open reduction and internal fixation of left femur fracture.   Electronically Signed   By: Lorriane Shire M.D.   On: 05/09/2015 17:36   Dg Hip Unilat With Pelvis 2-3 Views Left  05/05/2015   CLINICAL DATA:  81 year old female with fall and left hip pain.  EXAM: DG HIP (WITH OR WITHOUT PELVIS) 2-3V LEFT  COMPARISON:  None.  FINDINGS: There is severe osteopenia which limits evaluation of the study for fracture. There is a right total hip arthroplasty. There is a focal area of cortical discontinue the in the medial portion of the left femoral head, likely artifactual. A cortical fracture is less likely. Clinical correlation is recommended. No definite fracture identified. There is no dislocation on the provided images. CT may provide better evaluation for fracture if clinically indicated.  The soft tissues are grossly unremarkable.  IMPRESSION: Severe osteopenia.  No definite acute fracture or dislocation.  Apparent cortical discontinuity along the medial aspect of the left femoral head is most likely artifactual and less likely represents a cortical fracture. Clinical correlation is recommended.   Electronically Signed   By: Anner Crete M.D.   On: 05/05/2015 03:51     Assessment/Plan  1. Closed left hip fracture, initial encounter Patient will be engaged in physical therapy and occupational therapy. Routine wound care.  2. Alzheimer's disease Continue donepezil  3. Hip pain, left Pain control  with her medications  4. Hypokalemia -BMP, future  5. Hyponatremia -BMP, future  6. PAD (peripheral artery disease) Unlikely to interfere with healing wound left hip  7. Pressure ulcer Sacral, fairly shallow. Patient is quite thin and there is risk of these wounds enlarging.  8. Dysphagia, pharyngoesophageal phase Likely cause of patient's choking and coughing. Continue dysphagia 3 diet.  9. Decubitus ulcer of sacral region, stage 3 Routine wound care for this area  10. Osteoporosis Discontinued Actonel due to her dysphagia and history of esophageal problems.  11. DNR (do not resuscitate)

## 2015-05-14 NOTE — Assessment & Plan Note (Signed)
Continue Levothyroxine 47mcg, TSH 1.866 11/01/13. Update TSH

## 2015-05-15 LAB — BASIC METABOLIC PANEL
BUN: 12 mg/dL (ref 4–21)
CREATININE: 0.4 mg/dL — AB (ref 0.5–1.1)
GLUCOSE: 102 mg/dL
POTASSIUM: 3.7 mmol/L (ref 3.4–5.3)
Sodium: 136 mmol/L — AB (ref 137–147)

## 2015-05-15 LAB — TSH: TSH: 4.5 u[IU]/mL (ref 0.41–5.90)

## 2015-05-15 LAB — HEPATIC FUNCTION PANEL
ALT: 10 U/L (ref 7–35)
AST: 22 U/L (ref 13–35)
Alkaline Phosphatase: 89 U/L (ref 25–125)
Bilirubin, Total: 1.4 mg/dL

## 2015-05-15 LAB — CBC AND DIFFERENTIAL
HCT: 39 % (ref 36–46)
Hemoglobin: 13.8 g/dL (ref 12.0–16.0)
PLATELETS: 210 10*3/uL (ref 150–399)
WBC: 13.5 10*3/mL

## 2015-05-16 ENCOUNTER — Encounter (HOSPITAL_COMMUNITY): Payer: Self-pay | Admitting: Orthopaedic Surgery

## 2015-05-16 ENCOUNTER — Non-Acute Institutional Stay (SKILLED_NURSING_FACILITY): Payer: Medicare Other | Admitting: Nurse Practitioner

## 2015-05-16 DIAGNOSIS — D62 Acute posthemorrhagic anemia: Secondary | ICD-10-CM | POA: Diagnosis not present

## 2015-05-16 DIAGNOSIS — E871 Hypo-osmolality and hyponatremia: Secondary | ICD-10-CM

## 2015-05-16 DIAGNOSIS — E876 Hypokalemia: Secondary | ICD-10-CM | POA: Diagnosis not present

## 2015-05-16 DIAGNOSIS — E039 Hypothyroidism, unspecified: Secondary | ICD-10-CM

## 2015-05-16 DIAGNOSIS — G309 Alzheimer's disease, unspecified: Secondary | ICD-10-CM | POA: Diagnosis not present

## 2015-05-16 DIAGNOSIS — E43 Unspecified severe protein-calorie malnutrition: Secondary | ICD-10-CM | POA: Diagnosis not present

## 2015-05-16 DIAGNOSIS — J209 Acute bronchitis, unspecified: Secondary | ICD-10-CM | POA: Diagnosis not present

## 2015-05-16 DIAGNOSIS — F0280 Dementia in other diseases classified elsewhere without behavioral disturbance: Secondary | ICD-10-CM

## 2015-05-16 NOTE — Assessment & Plan Note (Signed)
Continue Levothyroxine 17mcg, TSH 1.866 11/01/13, TSH 4.499

## 2015-05-16 NOTE — Progress Notes (Signed)
Patient ID: Laura Benitez, female   DOB: [birth year], 81 y.o.   MRN: [id number]  Location:  SNF [facility] Kabel:  Marlana Latus NP  Code Status:  DNR Goals of care: Advanced Directive information    Chief Complaint  Patient presents with  . Medical Management of Chronic Issues  . Acute Visit    O2 desaturation     HPI: Patient is a 81 y.o. female seen in the SNF at [facility] today for evaluation of  O2 desaturation, Sat O2 80s early in am, up to 94% when O2 2lpm [place] applied, congestive cough, yellow sputum production, denied chest pain or palpitation.   Review of Systems:  Review of Systems  Constitutional: Negative for fever, chills and diaphoresis.       Frail. Thin.  HENT: Positive for hearing loss. Negative for congestion, ear discharge and sore throat.   Eyes: Negative for photophobia, pain, discharge and redness.  Respiratory: Positive for cough, sputum production and shortness of breath (exertional. ). Negative for wheezing.   Cardiovascular: Positive for leg swelling. Negative for chest pain and palpitations.       Trace edema BLE  Gastrointestinal: Negative for nausea, vomiting, abdominal pain, diarrhea and constipation.       Hx GERD.  Genitourinary: Negative for dysuria, urgency, frequency, hematuria and flank pain.       Sleeps through night since Myrbetriq  Musculoskeletal: Negative for myalgias, back pain and neck pain.       Hospitalization 05/05/2015 4 left hip fracture following a fall.  Skin: Positive for rash.       Pigmented BLE.   Neurological: Negative for dizziness, tremors, seizures, weakness and headaches.       MEMORY LOSS most likely secondary to Alzheimer's  Endo/Heme/Allergies: Negative for environmental allergies. Does not bruise/bleed easily.  Psychiatric/Behavioral: Negative for hallucinations. The patient is not nervous/anxious.     Past Medical History  Diagnosis Date  . Anemia   . Depression   . Anxiety   . Fracture of lumbar spine       History in 2000  . Allergy   . Migraine headache   . GERD (gastroesophageal reflux disease)   . UTI (urinary tract infection)   . Otalgia, unspecified 10/14/2012  . Stage I pressure ulcer of back 10/14/2012  . Shortness of breath 10/14/2012  . 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 Chronic fatigue syndrome 02/09/2012  . Abnormality of gait 01/30/2012  . Senile osteoporosis 12/25/2011  . Congestive heart failure, unspecified 12/10/2011  . Acute bronchitis 12/04/2011  . Closed fracture of lateral malleolus 11/27/2011  . Edema 10/30/2011  . Peripheral vascular disease, unspecified 10/20/2011  . Urinary frequency 09/25/2011  . Throat pain 08/13/2011  . Candidiasis of other urogenital sites 07/21/2011  . Unspecified hypothyroidism 07/21/2011  . Personality change due to conditions classified elsewhere 07/21/2011  . Cracked tooth 07/21/2011  . Candidiasis of mouth 07/16/2011  . Neoplasm of uncertain behavior of skin 07/16/2011  . Lumbago 07/16/2011  . Dysphagia 07/16/2011  . Hyposmolality and/or hyponatremia 07/15/2011  . Acute posthemorrhagic anemia 07/15/2011  . Major depressive disorder, single episode, unspecified 07/15/2011  . Unspecified constipation 07/15/2011  . Pain in joint, pelvic region and thigh 07/15/2011  . Other closed fractures of distal end of radius (alone) 07/15/2011  . Closed fracture of distal end of ulna (alone) 07/15/2011  . Other orthopedic aftercare(V54.89) 07/15/2011  . CAD (coronary artery disease) 01/11/2015  . PAD (peripheral artery disease) 01/11/2015  . Alzheimer's disease  05/03/2015  . Acute blood loss anemia   . Dysphagia, pharyngoesophageal phase 05/14/2015  . Fall     Patient Active Problem List   Diagnosis Date Noted  . Acute bronchitis 05/16/2015  . Osteoporosis 05/14/2015  . Dysphagia, pharyngoesophageal phase 05/14/2015  . Decubitus ulcer of sacral region, stage 3 05/14/2015  . Acute blood loss anemia   . Pulmonary hypertension   . Tricuspid valve  regurgitation   . Protein-calorie malnutrition, severe 05/09/2015  . CAD in native artery   . Closed left hip fracture   . Pressure ulcer 05/06/2015  . Hypokalemia 05/05/2015  . Hyponatremia 05/05/2015  . Fall at nursing home 05/05/2015  . Thrombocytopenia 05/05/2015  . Left hip pain 05/05/2015  . Hypotension 05/05/2015  . Fall   . Alzheimer's disease 05/03/2015  . Hip pain 01/11/2015  . CAD (coronary artery disease) 01/11/2015  . PAD (peripheral artery disease) 01/11/2015  . Malaise 01/26/2014  . H/O carotid atherosclerosis 10/29/2013  . Hypothyroidism 01/06/2013  . 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 GERD (gastroesophageal reflux disease) 01/06/2013  . DNR (do not resuscitate) 01/06/2013  . Venous insufficiency 12/17/2011  . Constipation 09/11/2011  . Depression 07/10/2011  . Anxiety 07/10/2011    Allergies  Allergen Reactions  . Milk-Related Compounds Other (See Comments)    Milk products cause patient to have migraine headaches.  Pt can tolerate butter.    Medications: Patient's Medications  New Prescriptions   No medications on file  Previous Medications   ACTONEL 150 MG TABLET    Take 150 mg by mouth every 30 (thirty) days. Take  1 tablet once a month on the 29th of the month.   ALUM & MAG HYDROXIDE-SIMETH (MINTOX) [phone] MG/5ML SUSPENSION    Take 30 mLs by mouth 3 (three) times daily. Take 30 ml three times daily as needed for indigestion.   CALCIUM-VITAMIN D (OSCAL WITH D) 500-200 MG-UNIT PER TABLET    Take 1 tablet by mouth 2 (two) times daily.   DONEPEZIL (ARICEPT) 5 MG TABLET    One nightly to help preserve memory   ENOXAPARIN (LOVENOX) 30 MG/0.3ML INJECTION    Inject 0.3 mLs (30 mg total) into the skin daily.   FLUTICASONE (FLONASE) 50 MCG/ACT NASAL SPRAY    Place 2 sprays into the nose daily. Daily   HYDROCODONE-ACETAMINOPHEN (NORCO) 7.5-325 MG PER TABLET    Take 1-2 tablets by mouth every 6 (six) hours as needed for moderate pain.   LEVOTHYROXINE  (SYNTHROID, LEVOTHROID) 50 MCG TABLET    Take 50 mcg by mouth daily. Take 1 tablet by mouth daily.   LOPERAMIDE (IMODIUM) 2 MG CAPSULE    Take 2-4 mg by mouth 3 (three) times daily as needed for diarrhea or loose stools.   MALTODEXTRIN-XANTHAN GUM (RESOURCE THICKENUP CLEAR) POWD    Follow directions on package for nectar thick liquids   MYRBETRIQ 25 MG TB24    Take 25 mg by mouth daily. Take one tablet daily   OMEPRAZOLE (PRILOSEC) 40 MG CAPSULE    Take 40 mg by mouth daily.   POLYETHYL GLYCOL-PROPYL GLYCOL (SYSTANE) 0.4-0.3 % SOLN    Apply 1 drop to eye every 4 (four) hours as needed (for eye discomfort). Both eyes as needed for comfort   PROTEIN SUPPLEMENT (RESOURCE BENEPROTEIN) POWD    Take 1 scoop by mouth daily. MIX 1 SCOOP IN 4 OZ. OF HONEY THICK H20 AND TAKE BY MOUTH AT 2 PM.   PSYLLIUM (METAMUCIL) 58.6 % POWDER  Take 1 packet by mouth daily as needed (for constipation). Take teaspoonful mixed in 8oz fluide daily as needed   SERTRALINE (ZOLOFT) 100 MG TABLET    One daily to help depression and anxiety   SIMETHICONE (MYLICON) 462 MG CHEWABLE TABLET    Chew 125 mg by mouth 3 (three) times daily as needed for flatulence.   SODIUM CHLORIDE (DEEP SEA NASAL SPRAY) 0.65 % NASAL SPRAY    Place 1 spray into the nose. Use 1 spray in each nostril as needed   VITAMIN A 70350 UNIT CAPSULE    Take 10,000 Units by mouth daily.  Modified Medications   No medications on file  Discontinued Medications   No medications on file    Physical Exam: Filed Vitals:   05/16/15 0926  BP: 122/78  Pulse: 70  Temp: 99.2 F (37.3 C)  TempSrc: Tympanic  Resp: 18   There is no weight on file to calculate BMI.  Physical Exam  Constitutional: She is oriented to person, place, and time. She appears well-developed and well-nourished. No distress.  HENT:  Head: Normocephalic and atraumatic.  Right Ear: External ear normal.  Left Ear: External ear normal.  Mouth/Throat: Oropharynx is clear and moist.  Eyes:  Conjunctivae and EOM are normal. Pupils are equal, round, and reactive to light. Right eye exhibits no discharge. Left eye exhibits no discharge. No scleral icterus.  Neck: Normal range of motion. No JVD present. No tracheal deviation present. No thyromegaly present.  Cardiovascular: Normal rate, regular rhythm and normal heart sounds.  Exam reveals no friction rub.   No murmur heard. Absent DP and PT bilaterally. Chronic venous insufficiency.  Pulmonary/Chest: Effort normal. No stridor. No respiratory distress. She has no wheezes. She has no rales. She exhibits no tenderness.  Central congestion  Abdominal: Soft. Bowel sounds are normal. She exhibits no distension and no mass. There is no tenderness. There is no guarding.  Musculoskeletal: She exhibits edema (BLE--trace. ) and tenderness (lower legs bilaterally).  Ambulates with walker. Severe kyphosis. Trace edema BLE. Dependent rubor. Recent incision of the left hip is clean and without peripheral bruising. Diagnosis osteoporosis and was put on Actonel during hospital stay.  Lymphadenopathy:    She has no cervical adenopathy.  Neurological: She is alert and oriented to person, place, and time. She has normal reflexes. She displays normal reflexes. No cranial nerve deficit. She exhibits normal muscle tone. Coordination normal.  05/03/2015 MMSE 18/30. Clock drawing poorly centered.  Skin: Skin is warm and dry. She is not diaphoretic.  BLE pigmented change and dependent rubor seen. R+L buttock rash. Sacral decubitus.  Psychiatric:  History of anxiety. Previously on lorazepam, but this was discontinued during hospital stay    Labs reviewed: Basic Metabolic Panel:  Recent Labs  05/08/15 0319 05/10/15 0417 05/11/15 0308 05/12/15 1002 05/15/15  NA 130* 130* 128* 133* 136*  K 4.2 5.7* 4.7 3.7 3.7  CL 98* 99* 96* 97*  --   CO2 28 23 27 28   --   GLUCOSE 95 99 142* 128*  --   BUN <5* 5* 11 9 12   CREATININE 0.46 0.61 0.59 0.55 0.4*    CALCIUM 7.9* 8.5* 8.0* 8.1*  --   MG 1.9  --  1.6* 1.7  --     Liver Function Tests:  Recent Labs  05/05/15 0233 05/08/15 0319 05/15/15  AST 39 24 22  ALT 22 15 10   ALKPHOS 78 68 89  BILITOT 1.2 1.1  --   PROT  5.8* 4.8*  --   ALBUMIN 3.5 2.7*  --     CBC:  Recent Labs  05/05/15 0233  05/10/15 0417 05/11/15 0305 05/11/15 1234 05/12/15 1002 05/15/15  WBC 8.8  < > 13.3* 7.9  --  8.6 13.5  NEUTROABS 7.7  --   --  6.2  --   --   --   HGB 13.0  < > 12.2 9.4* 8.9* 12.9 13.8  HCT 36.2  < > 36.1 27.4* 25.1* 37.6 39  MCV 88.1  < > 90.9 91.0  --  89.1  --   PLT 123*  < > 157 149*  --  122* 210  < > = values in this interval not displayed.  Lab Results  Component Value Date   TSH 4.50 05/15/2015   No results found for: HGBA1C No results found for: CHOL, HDL, LDLCALC, LDLDIRECT, TRIG, CHOLHDL  Significant Diagnostic Results since last visit: none  Patient Care Team: [name], MD as PCP - General (Internal Medicine) Altamese Maxwell, MD as Consulting Physician (Orthopedic Surgery) Sydnee Levans, MD as Consulting Physician (Dermatology) Griselda Miner, MD as Consulting Physician (Dermatology)  Assessment/Plan Problem List Items Addressed This Visit    Protein-calorie malnutrition, severe    05/15/15 total protein 5.2, albumin 3.1      Hypothyroidism (Chronic)    Continue Levothyroxine 68mcg, TSH 1.866 11/01/13, TSH 4.499      Hyponatremia    Na 133 05/12/15, 05/15/15 Na 136      Hypokalemia    05/15/15 K 3.7      Alzheimer's disease (Chronic)    Continue Aricept. Last MMSE 17/30 05/04/15      Acute bronchitis - Primary    Congestive cough, O2 desaturation, start O2 2lpm via [place] to maintain Sat O2>90%, CXR, 7 day course Levaquin 500mg  daily x 7 days.       Acute blood loss anemia    Post op Hgb 12.9 05/12/15, Hgb 13.8 05/15/15, -9/23 Transfuse 2 units PRBC          Family/ staff Communication: monitor for Sat O2, respiratory symptoms.    Labs/tests ordered:  CXR  [facility] Mast NP [facility] [address] [place],  [address] On Call:  [phone] & follow prompts after 5pm & weekends Office Phone:  [phone] Office Fax:  [phone]

## 2015-05-16 NOTE — Assessment & Plan Note (Signed)
Post op Hgb 12.9 05/12/15, Hgb 13.8 05/15/15, -9/23 Transfuse 2 units PRBC

## 2015-05-16 NOTE — Assessment & Plan Note (Signed)
Continue Aricept. Last MMSE 17/30 05/04/15

## 2015-05-16 NOTE — Assessment & Plan Note (Signed)
Na 133 05/12/15, 05/15/15 Na 136

## 2015-05-16 NOTE — Assessment & Plan Note (Signed)
05/15/15 total protein 5.2, albumin 3.1

## 2015-05-16 NOTE — Assessment & Plan Note (Signed)
05/15/15 K 3.7

## 2015-05-16 NOTE — Assessment & Plan Note (Signed)
Congestive cough, O2 desaturation, start O2 2lpm via [place] to maintain Sat O2>90%, CXR, 7 day course Levaquin 500mg  daily x 7 days.

## 2015-05-18 ENCOUNTER — Inpatient Hospital Stay (HOSPITAL_COMMUNITY): Payer: Medicare Other

## 2015-05-18 ENCOUNTER — Encounter (HOSPITAL_COMMUNITY): Payer: Self-pay | Admitting: Emergency Medicine

## 2015-05-18 ENCOUNTER — Emergency Department (HOSPITAL_COMMUNITY): Payer: Medicare Other

## 2015-05-18 ENCOUNTER — Inpatient Hospital Stay (HOSPITAL_COMMUNITY)
Admission: EM | Admit: 2015-05-18 | Discharge: 2015-06-19 | DRG: 871 | Disposition: E | Payer: Medicare Other | Attending: Internal Medicine | Admitting: Internal Medicine

## 2015-05-18 DIAGNOSIS — J9601 Acute respiratory failure with hypoxia: Secondary | ICD-10-CM | POA: Diagnosis present

## 2015-05-18 DIAGNOSIS — E876 Hypokalemia: Secondary | ICD-10-CM | POA: Diagnosis not present

## 2015-05-18 DIAGNOSIS — A419 Sepsis, unspecified organism: Secondary | ICD-10-CM | POA: Diagnosis present

## 2015-05-18 DIAGNOSIS — Z96641 Presence of right artificial hip joint: Secondary | ICD-10-CM | POA: Diagnosis present

## 2015-05-18 DIAGNOSIS — E43 Unspecified severe protein-calorie malnutrition: Secondary | ICD-10-CM | POA: Diagnosis present

## 2015-05-18 DIAGNOSIS — Z515 Encounter for palliative care: Secondary | ICD-10-CM | POA: Insufficient documentation

## 2015-05-18 DIAGNOSIS — Z789 Other specified health status: Secondary | ICD-10-CM | POA: Diagnosis not present

## 2015-05-18 DIAGNOSIS — K219 Gastro-esophageal reflux disease without esophagitis: Secondary | ICD-10-CM | POA: Diagnosis present

## 2015-05-18 DIAGNOSIS — I739 Peripheral vascular disease, unspecified: Secondary | ICD-10-CM | POA: Diagnosis present

## 2015-05-18 DIAGNOSIS — Z79899 Other long term (current) drug therapy: Secondary | ICD-10-CM

## 2015-05-18 DIAGNOSIS — I2584 Coronary atherosclerosis due to calcified coronary lesion: Secondary | ICD-10-CM

## 2015-05-18 DIAGNOSIS — F0280 Dementia in other diseases classified elsewhere without behavioral disturbance: Secondary | ICD-10-CM | POA: Diagnosis present

## 2015-05-18 DIAGNOSIS — R778 Other specified abnormalities of plasma proteins: Secondary | ICD-10-CM | POA: Diagnosis present

## 2015-05-18 DIAGNOSIS — Z7951 Long term (current) use of inhaled steroids: Secondary | ICD-10-CM

## 2015-05-18 DIAGNOSIS — I2510 Atherosclerotic heart disease of native coronary artery without angina pectoris: Secondary | ICD-10-CM | POA: Diagnosis present

## 2015-05-18 DIAGNOSIS — R000 Tachycardia, unspecified: Secondary | ICD-10-CM | POA: Diagnosis present

## 2015-05-18 DIAGNOSIS — G931 Anoxic brain damage, not elsewhere classified: Secondary | ICD-10-CM | POA: Diagnosis present

## 2015-05-18 DIAGNOSIS — R569 Unspecified convulsions: Secondary | ICD-10-CM | POA: Diagnosis not present

## 2015-05-18 DIAGNOSIS — E039 Hypothyroidism, unspecified: Secondary | ICD-10-CM | POA: Diagnosis present

## 2015-05-18 DIAGNOSIS — Y95 Nosocomial condition: Secondary | ICD-10-CM | POA: Diagnosis present

## 2015-05-18 DIAGNOSIS — Z66 Do not resuscitate: Secondary | ICD-10-CM | POA: Diagnosis present

## 2015-05-18 DIAGNOSIS — I5021 Acute systolic (congestive) heart failure: Secondary | ICD-10-CM | POA: Diagnosis present

## 2015-05-18 DIAGNOSIS — F329 Major depressive disorder, single episode, unspecified: Secondary | ICD-10-CM | POA: Diagnosis present

## 2015-05-18 DIAGNOSIS — R64 Cachexia: Secondary | ICD-10-CM | POA: Diagnosis present

## 2015-05-18 DIAGNOSIS — I214 Non-ST elevation (NSTEMI) myocardial infarction: Secondary | ICD-10-CM | POA: Diagnosis not present

## 2015-05-18 DIAGNOSIS — G309 Alzheimer's disease, unspecified: Secondary | ICD-10-CM | POA: Diagnosis present

## 2015-05-18 DIAGNOSIS — M810 Age-related osteoporosis without current pathological fracture: Secondary | ICD-10-CM | POA: Diagnosis present

## 2015-05-18 DIAGNOSIS — J189 Pneumonia, unspecified organism: Secondary | ICD-10-CM

## 2015-05-18 DIAGNOSIS — Z7401 Bed confinement status: Secondary | ICD-10-CM | POA: Diagnosis not present

## 2015-05-18 DIAGNOSIS — Z7901 Long term (current) use of anticoagulants: Secondary | ICD-10-CM

## 2015-05-18 DIAGNOSIS — R7989 Other specified abnormal findings of blood chemistry: Secondary | ICD-10-CM | POA: Diagnosis present

## 2015-05-18 DIAGNOSIS — R0602 Shortness of breath: Secondary | ICD-10-CM

## 2015-05-18 DIAGNOSIS — F419 Anxiety disorder, unspecified: Secondary | ICD-10-CM | POA: Diagnosis present

## 2015-05-18 DIAGNOSIS — Z681 Body mass index (BMI) 19 or less, adult: Secondary | ICD-10-CM

## 2015-05-18 DIAGNOSIS — J810 Acute pulmonary edema: Secondary | ICD-10-CM

## 2015-05-18 LAB — BASIC METABOLIC PANEL
Anion gap: 19 — ABNORMAL HIGH (ref 5–15)
BUN: 16 mg/dL (ref 6–20)
CALCIUM: 8.9 mg/dL (ref 8.9–10.3)
CO2: 23 mmol/L (ref 22–32)
Chloride: 97 mmol/L — ABNORMAL LOW (ref 101–111)
Creatinine, Ser: 0.79 mg/dL (ref 0.44–1.00)
GFR calc Af Amer: >60 mL/min (ref >60)
Glucose, Bld: 137 mg/dL — ABNORMAL HIGH (ref 65–99)
POTASSIUM: 4.3 mmol/L (ref 3.5–5.1)
SODIUM: 139 mmol/L (ref 135–145)

## 2015-05-18 LAB — BLOOD GAS, ARTERIAL
ACID-BASE EXCESS: 4.1 mmol/L — AB (ref 0.0–2.0)
Bicarbonate: 27.4 mEq/L — ABNORMAL HIGH (ref 20.0–24.0)
Delivery systems: POSITIVE
Drawn by: 418751
EXPIRATORY PAP: 6
FIO2: 0.6
INSPIRATORY PAP: 12
O2 SAT: 97.5 %
PATIENT TEMPERATURE: 98.6
PCO2 ART: 36.3 mmHg (ref 35.0–45.0)
RATE: 15 resp/min
TCO2: 28.5 mmol/L (ref 0–100)
pH, Arterial: 7.49 — ABNORMAL HIGH (ref 7.350–7.450)
pO2, Arterial: 89.8 mmHg (ref 80.0–100.0)

## 2015-05-18 LAB — CBC WITH DIFFERENTIAL/PLATELET
BASOS ABS: 0 10*3/uL (ref 0.0–0.1)
Basophils Relative: 0 %
EOS ABS: 0 10*3/uL (ref 0.0–0.7)
EOS PCT: 0 %
HCT: 45.5 % (ref 36.0–46.0)
Hemoglobin: 15.3 g/dL — ABNORMAL HIGH (ref 12.0–15.0)
LYMPHS ABS: 0.6 10*3/uL — AB (ref 0.7–4.0)
Lymphocytes Relative: 4 %
MCH: 31.5 pg (ref 26.0–34.0)
MCHC: 33.6 g/dL (ref 30.0–36.0)
MCV: 93.8 fL (ref 78.0–100.0)
Monocytes Absolute: 0.6 10*3/uL (ref 0.1–1.0)
Monocytes Relative: 4 %
Neutro Abs: 13 10*3/uL — ABNORMAL HIGH (ref 1.7–7.7)
Neutrophils Relative %: 92 %
PLATELETS: 322 10*3/uL (ref 150–400)
RBC: 4.85 MIL/uL (ref 3.87–5.11)
RDW: 15.7 % — ABNORMAL HIGH (ref 11.5–15.5)
WBC: 14.2 10*3/uL — AB (ref 4.0–10.5)

## 2015-05-18 LAB — I-STAT ARTERIAL BLOOD GAS, ED
ACID-BASE EXCESS: 1 mmol/L (ref 0.0–2.0)
BICARBONATE: 26.3 meq/L — AB (ref 20.0–24.0)
O2 Saturation: 97 %
PCO2 ART: 45.5 mmHg — AB (ref 35.0–45.0)
PO2 ART: 95 mmHg (ref 80.0–100.0)
Patient temperature: 98.6
TCO2: 28 mmol/L (ref 0–100)
pH, Arterial: 7.37 (ref 7.350–7.450)

## 2015-05-18 LAB — TROPONIN I
TROPONIN I: 1.23 ng/mL — AB (ref <0.031)
Troponin I: 1.43 ng/mL (ref <0.031)

## 2015-05-18 LAB — I-STAT TROPONIN, ED
TROPONIN I, POC: 1.31 ng/mL — AB (ref 0.00–0.08)
Troponin i, poc: 1.32 ng/mL (ref 0.00–0.08)
Troponin i, poc: 2.06 ng/mL (ref 0.00–0.08)

## 2015-05-18 LAB — GLUCOSE, CAPILLARY: Glucose-Capillary: 105 mg/dL — ABNORMAL HIGH (ref 65–99)

## 2015-05-18 LAB — I-STAT CG4 LACTIC ACID, ED
LACTIC ACID, VENOUS: 4.14 mmol/L — AB (ref 0.5–2.0)
Lactic Acid, Venous: 2.07 mmol/L (ref 0.5–2.0)

## 2015-05-18 LAB — BRAIN NATRIURETIC PEPTIDE: B Natriuretic Peptide: 285.7 pg/mL — ABNORMAL HIGH (ref 0.0–100.0)

## 2015-05-18 MED ORDER — LEVOTHYROXINE SODIUM 100 MCG IV SOLR
25.0000 ug | Freq: Every day | INTRAVENOUS | Status: DC
  Filled 2015-05-18: qty 5

## 2015-05-18 MED ORDER — VANCOMYCIN HCL 500 MG IV SOLR
500.0000 mg | INTRAVENOUS | Status: DC
  Administered 2015-05-19: 500 mg via INTRAVENOUS
  Filled 2015-05-18 (×2): qty 500

## 2015-05-18 MED ORDER — LEVOTHYROXINE SODIUM 100 MCG IV SOLR
25.0000 ug | Freq: Every day | INTRAVENOUS | Status: DC
  Administered 2015-05-18 – 2015-05-19 (×2): 25 ug via INTRAVENOUS
  Filled 2015-05-18 (×3): qty 5

## 2015-05-18 MED ORDER — SODIUM CHLORIDE 0.9 % IV SOLN
1500.0000 mg | Freq: Once | INTRAVENOUS | Status: DC
  Filled 2015-05-18: qty 1500

## 2015-05-18 MED ORDER — VANCOMYCIN HCL IN DEXTROSE 1-5 GM/200ML-% IV SOLN
1000.0000 mg | Freq: Once | INTRAVENOUS | Status: AC
  Administered 2015-05-18: 1000 mg via INTRAVENOUS
  Filled 2015-05-18: qty 200

## 2015-05-18 MED ORDER — SODIUM CHLORIDE 0.9 % IV BOLUS (SEPSIS)
1000.0000 mL | Freq: Once | INTRAVENOUS | Status: AC
Start: 2015-05-18 — End: 2015-05-18
  Administered 2015-05-18: 1000 mL via INTRAVENOUS

## 2015-05-18 MED ORDER — PIPERACILLIN-TAZOBACTAM 3.375 G IVPB
3.3750 g | Freq: Once | INTRAVENOUS | Status: AC
  Administered 2015-05-18: 3.375 g via INTRAVENOUS
  Filled 2015-05-18: qty 50

## 2015-05-18 MED ORDER — FUROSEMIDE 10 MG/ML IJ SOLN
40.0000 mg | Freq: Once | INTRAMUSCULAR | Status: AC
  Administered 2015-05-18: 40 mg via INTRAVENOUS
  Filled 2015-05-18: qty 4

## 2015-05-18 MED ORDER — ASPIRIN 300 MG RE SUPP
300.0000 mg | Freq: Every day | RECTAL | Status: DC
  Administered 2015-05-18 – 2015-05-19 (×2): 300 mg via RECTAL
  Filled 2015-05-18 (×3): qty 1

## 2015-05-18 MED ORDER — VANCOMYCIN HCL 10 G IV SOLR: 1500.0000 mg | Freq: Once | INTRAVENOUS | Status: DC

## 2015-05-18 MED ORDER — CHLORHEXIDINE GLUCONATE 0.12 % MT SOLN
15.0000 mL | Freq: Two times a day (BID) | OROMUCOSAL | Status: DC
  Administered 2015-05-19 (×2): 15 mL via OROMUCOSAL
  Filled 2015-05-18 (×6): qty 15

## 2015-05-18 MED ORDER — DEXTROSE 5 % IV SOLN
2.0000 g | Freq: Two times a day (BID) | INTRAVENOUS | Status: DC
  Administered 2015-05-18 – 2015-05-19 (×3): 2 g via INTRAVENOUS
  Filled 2015-05-18 (×5): qty 2

## 2015-05-18 MED ORDER — WHITE PETROLATUM GEL
Status: AC
  Administered 2015-05-18: 22:00:00
  Filled 2015-05-18: qty 1

## 2015-05-18 MED ORDER — PIPERACILLIN-TAZOBACTAM IN DEX 2-0.25 GM/50ML IV SOLN: 2.2500 g | Freq: Once | INTRAVENOUS | Status: DC

## 2015-05-18 MED ORDER — ENOXAPARIN SODIUM 30 MG/0.3ML ~~LOC~~ SOLN
30.0000 mg | SUBCUTANEOUS | Status: DC
  Administered 2015-05-18 – 2015-05-19 (×2): 30 mg via SUBCUTANEOUS
  Filled 2015-05-18 (×3): qty 0.3

## 2015-05-18 MED ORDER — IOHEXOL 350 MG/ML SOLN
80.0000 mL | Freq: Once | INTRAVENOUS | Status: AC | PRN
  Administered 2015-05-18: 70 mL via INTRAVENOUS

## 2015-05-18 NOTE — ED Provider Notes (Signed)
I saw and evaluated the patient, reviewed the resident's note and I agree with the findings and plan.  A 81 year old female who is DO NOT RESUSCITATE came from her facility to 3 days progressively worsening dyspnea and new onset hypoxia with sats in the 60s on room air. Patient on nonrebreather on arrival here still been read respiratory distress, tachycardic and high as oxygen saturations of 87% on a nonrebreather. Lungs relatively clear patient without any complaints besides shortness of breath EKG with some lateral ST elevations but not meeting criteria for STEMI cardiology consult immediately. Patient started on BiPAP for oxygenation with immediate improvement. Discussed the patient's status with her sister and her power of attorney who is her nephew and they both confirmed DNR/DNI status. Patient was covered negative Zosyn and a CT PE study was done which resulted with bilateral multifocal infiltrates. Patient admitted to stepdown.   CRITICAL CARE Performed by: Merrily Pew   Total critical care time: 40 minutes  Critical care time was exclusive of separately billable procedures and treating other patients.  Critical care was necessary to treat or prevent imminent or life-threatening deterioration.  Critical care was time spent personally by me on the following activities: development of treatment plan with patient and/or surrogate as well as nursing, discussions with consultants, evaluation of patient's response to treatment, examination of patient, obtaining history from patient or surrogate, ordering and performing treatments and interventions, ordering and review of laboratory studies, ordering and review of radiographic studies, pulse oximetry and re-evaluation of patient's condition.    EKG Interpretation   Date/Time:  Friday May 18 2015 10:00:14 EDT Ventricular Rate:  117 PR Interval:  116 QRS Duration: 75 QT Interval:  332 QTC Calculation: 463 R Axis:   44 Text  Interpretation:  Sinus tachycardia Atrial premature complex Probable  anteroseptal infarct, recent Lateral leads are also involved Confirmed by  [facility] MD, Corene Cornea [id number]) on 05/05/2015 10:21:39 AM        Merrily Pew, MD 05/01/2015 [id number]

## 2015-05-18 NOTE — ED Notes (Signed)
CRITICAL VALUE ALERT  Critical value received:  Troponin 1.43  Date of notification:  05/05/2015  Time of notification: 14:21  Critical value read back:yes Nurse who received alert:  Renne Crigler RN

## 2015-05-18 NOTE — H&P (Signed)
[facility] History and Physical  Laura Benitez JOI:[id number] DOB: 06/04/1929 DOA: 04/30/2015  Referring physician:    Emergency Department PCP: [name], MD   Chief Complaint: Shortness of breath   HPI: Laura Benitez is a 81 y.o. female who presents to ED with shortness of breath and tachycardia. Oxygen sats in 60's per EMS report. Patient has a history of alzheimer's. No family members present. She is s/p hip surgery approximately two weeks ago, on outpatient Lovenox.  ED pertinent findings / treatment:    Lactic acid 4.1, WBC 14.1. 02 sats improved on non-rebreather.  CTA of chest negative for PE but shows moderate right and a small left pleural effusion.   Review of Systems:  Unable to obtain based on altered mentation. Please see H&P from nursing home and EDP  Past Medical History  Diagnosis Date  . Anemia   . Depression   . Anxiety   . Fracture of lumbar spine     History in 2000  . Allergy   . Migraine headache   . GERD (gastroesophageal reflux disease)   . UTI (urinary tract infection)   . Otalgia, unspecified 10/14/2012  . Stage I pressure ulcer of back 10/14/2012  . Shortness of breath 10/14/2012  . 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 Chronic fatigue syndrome 02/09/2012  . Abnormality of gait 01/30/2012  . Senile osteoporosis 12/25/2011  . Congestive heart failure, unspecified 12/10/2011  . Acute bronchitis 12/04/2011  . Closed fracture of lateral malleolus 11/27/2011  . Peripheral vascular disease, unspecified 10/20/2011  . Urinary frequency 09/25/2011  . Candidiasis of other urogenital sites 07/21/2011  . Unspecified hypothyroidism 07/21/2011  . Personality change due to conditions classified elsewhere 07/21/2011  . Cracked tooth 07/21/2011  . Candidiasis of mouth 07/16/2011  . Neoplasm of uncertain behavior of skin 07/16/2011  . Lumbago 07/16/2011  . Hyposmolality and/or hyponatremia 07/15/2011  . Acute posthemorrhagic anemia 07/15/2011  . Major  depressive disorder, single episode, unspecified 07/15/2011  . Unspecified constipation 07/15/2011  . Pain in joint, pelvic region and thigh 07/15/2011  . Other closed fractures of distal end of radius (alone) 07/15/2011  . Closed fracture of distal end of ulna (alone) 07/15/2011  . CAD (coronary artery disease) 01/11/2015  . PAD (peripheral artery disease) 01/11/2015  . Alzheimer's disease 05/03/2015  . Acute blood loss anemia   . Dysphagia, pharyngoesophageal phase 05/14/2015  . Fall    Past Surgical History  Procedure Laterality Date  . Tonsillectomy      @age  4  . Total abdominal hysterectomy    . Hip arthroplasty  07/10/2011    Procedure: ARTHROPLASTY BIPOLAR HIP;  Surgeon: Rozanna Box, MD;  Location: [facility];  Service: Orthopedics;  Laterality: Right;  . Orif wrist fracture  07/10/2011    Procedure: OPEN REDUCTION INTERNAL FIXATION (ORIF) WRIST FRACTURE;  Surgeon: Rozanna Box, MD;  Location: [facility];  Service: Orthopedics;  Laterality: Right;  . Femur im nail Left 05/09/2015    Procedure: INTRAMEDULLARY (IM) NAIL FEMORAL;  Surgeon: Leandrew Koyanagi, MD;  Location: [facility];  Service: Orthopedics;  Laterality: Left;   Social History:  reports that she has never smoked. She has never used smokeless tobacco. She reports that she does not drink alcohol or use illicit drugs.  Lives:  Nursing Home With:  N/A Assistive Devices:  Unknown  Allergies  Allergen Reactions  . Milk-Related Compounds Other (See Comments)    Milk products cause patient to have migraine headaches.  Pt  can tolerate butter.    Family History  Problem Relation Age of Onset  . Lung cancer Father   . Colon cancer Neg Hx     Prior to Admission medications   Medication Sig Start Date End Date Taking? Authorizing Provider  ACTONEL 150 MG tablet Take 150 mg by mouth every 30 (thirty) days. Take  1 tablet once a month on the 29th of the month. 12/03/12   Historical Provider, MD  alum & mag hydroxide-simeth ([place])  200-200-20 MG/5ML suspension Take 30 mLs by mouth 3 (three) times daily. Take 30 ml three times daily as needed for indigestion.    Historical Provider, MD  calcium-vitamin D (OSCAL WITH D) 500-200 MG-UNIT per tablet Take 1 tablet by mouth 2 (two) times daily.    Historical Provider, MD  donepezil (ARICEPT) 5 MG tablet One nightly to help preserve memory 05/03/15   [name], MD  enoxaparin (LOVENOX) 30 MG/0.3ML injection Inject 0.3 mLs (30 mg total) into the skin daily. 05/09/15   Leandrew Koyanagi, MD  fluticasone (FLONASE) 50 MCG/ACT nasal spray Place 2 sprays into the nose daily. Daily 12/23/12   Historical Provider, MD  HYDROcodone-acetaminophen (NORCO) 7.5-325 MG per tablet Take 1-2 tablets by mouth every 6 (six) hours as needed for moderate pain. 05/09/15   Leandrew Koyanagi, MD  levothyroxine (SYNTHROID, LEVOTHROID) 50 MCG tablet Take 50 mcg by mouth daily. Take 1 tablet by mouth daily.    Historical Provider, MD  loperamide (IMODIUM) 2 MG capsule Take 2-4 mg by mouth 3 (three) times daily as needed for diarrhea or loose stools.    Historical Provider, MD  Maltodextrin-Xanthan Gum (RESOURCE THICKENUP CLEAR) POWD Follow directions on package for nectar thick liquids 05/12/15   Allie Bossier, MD  MYRBETRIQ 25 MG TB24 Take 25 mg by mouth daily. Take one tablet daily 12/22/12   Historical Provider, MD  omeprazole (PRILOSEC) 40 MG capsule Take 40 mg by mouth daily.    Historical Provider, MD  Polyethyl Glycol-Propyl Glycol (SYSTANE) 0.4-0.3 % SOLN Apply 1 drop to eye every 4 (four) hours as needed (for eye discomfort). Both eyes as needed for comfort    Historical Provider, MD  protein supplement (RESOURCE BENEPROTEIN) POWD Take 1 scoop by mouth daily. MIX 1 SCOOP IN 4 OZ. OF HONEY THICK H20 AND TAKE BY MOUTH AT 2 PM.    Historical Provider, MD  psyllium (METAMUCIL) 58.6 % powder Take 1 packet by mouth daily as needed (for constipation). Take teaspoonful mixed in 8oz fluide daily as needed    Historical  Provider, MD  sertraline (ZOLOFT) 100 MG tablet One daily to help depression and anxiety 03/13/15   [name], MD  simethicone (MYLICON) 161 MG chewable tablet Chew 125 mg by mouth 3 (three) times daily as needed for flatulence.    Historical Provider, MD  sodium chloride (DEEP SEA NASAL SPRAY) 0.65 % nasal spray Place 1 spray into the nose. Use 1 spray in each nostril as needed    Historical Provider, MD  vitamin A 10000 UNIT capsule Take 10,000 Units by mouth daily.    Historical Provider, MD   Physical Exam: Filed Vitals:   05/02/2015 0953 04/30/2015 1015 04/29/2015 1045 05/15/2015 1100  BP: 123/86 129/91 121/66 107/74  Pulse: 116 116 102 93  Temp: 97.6 F (36.4 C)     TempSrc: Axillary     Resp: 30  25 22   SpO2: 88% 89% 88% 100%    Wt Readings from  Last 3 Encounters:  05/14/15 43.092 kg (95 lb)  05/12/15 39.2 kg (86 lb 6.7 oz)  05/03/15 37.649 kg (83 lb)    General:  Thin white female. Bedside echo in progres Eyes: Eyes open, normal lids, irises & conjunctiva ENT: grossly normal hearing, lips Neck: no LAD, masses  Cardiovascular: tachcardic. No LE edema. Both feel cool to touch.  Respiratory: Inspiratory crackles in chest. Not repositioned to auscultate posteriorally, in midst of bedside echo. Labored breathing. Using accessory muscles.  Abdomen: soft, non-distended, nontender. Bowel sounds present. No masses appreciated. Skin: no rash or induration seen on limited exam Musculoskeletal: grossly normal tone BUE/BLE Psychiatric: cooperative. Follows simple commands.  Neurologic: grossly non-focal.         Labs on Admission:  Basic Metabolic Panel:  Recent Labs Lab 05/12/15 1002 05/15/15 05/05/2015 0959  NA 133* 136* 139  K 3.7 3.7 4.3  CL 97*  --  97*  CO2 28  --  23  GLUCOSE 128*  --  137*  BUN 9 12 16   CREATININE 0.55 0.4* 0.79  CALCIUM 8.1*  --  8.9  MG 1.7  --   --    Liver Function Tests:  Recent Labs Lab 05/15/15  AST 22  ALT 10  ALKPHOS 89      CBC:  Recent Labs Lab 05/11/15 1234 05/12/15 1002 05/15/15 04/28/2015 0959  WBC  --  8.6 13.5 14.2*  NEUTROABS  --   --   --  13.0*  HGB 8.9* 12.9 13.8 15.3*  HCT 25.1* 37.6 39 45.5  MCV  --  89.1  --  93.8  PLT  --  122* 210 322   Cardiac Enzymes: POC Troponin  1.32  BNP (last 3 results)  Recent Labs  05/14/2015 0959  BNP 285.7*     Radiological Exams on Admission: Ct Angio Chest Pe W/cm &/or Wo Cm  05/06/2015   CLINICAL DATA:  Shortness of breath, low O2 sats  EXAM: CT ANGIOGRAPHY CHEST WITH CONTRAST  TECHNIQUE: Multidetector CT imaging of the chest was performed using the standard protocol during bolus administration of intravenous contrast. Multiplanar CT image reconstructions and MIPs were obtained to evaluate the vascular anatomy.  CONTRAST:  59mL OMNIPAQUE IOHEXOL 350 MG/ML SOLN  COMPARISON:  None.  FINDINGS: There is adequate opacification of the pulmonary arteries. There is no pulmonary embolus. The main pulmonary artery, right main pulmonary artery and left main pulmonary arteries are normal in size. The heart size is enlarged. There is no pericardial effusion.  There is a moderate right and small left pleural effusion with bibasilar atelectasis. There is no pneumothorax. There are patchy ground-glass opacities bilaterally.  There is no axillary, hilar, or mediastinal adenopathy.  There is no lytic or blastic osseous lesion.  There is a severe chronic L1 vertebral body compression fracture. There is a severe age-indeterminate T8 vertebral body compression fracture. There are mild age-indeterminate T9, T10, T11 and T12 vertebral body compression fractures.  Review of the MIP images confirms the above findings.  IMPRESSION: 1. No evidence of pulmonary embolus. 2. No evidence of thoracic aortic dissection. 3. Findings most concerning for CHF.   Electronically Signed   By: Kathreen Devoid   On: 05/08/2015 11:39    EKG:  Sinus tachycardia Atrial premature complex Probable  anteroseptal infarct, recent Lateral leads are also involved   ASSESSMENT / PLAN:    Principal Problem:   Sepsis due to pneumonia Active Problems:   DNR (do not resuscitate)   CAD (  coronary artery disease)   PAD (peripheral artery disease)   Alzheimer's disease   Protein-calorie malnutrition, severe   Elevated troponin I level   Acute respiratory failure with hypoxia   Bilateral pneumonia   HCAP (healthcare-associated pneumonia)   Hypoxic encephalopathy  Sepsis due to HCAP pneumonia / Acute respiratory failure with hypoxemia. She has increased work on breathing on BiPAP / moderate right pleural effusion on CTscan. Patient is a DNR. Will admit to stepdown.   Spoke with patient's sister Azalee Course at [phone] regarding patient's condition. Plan is to continue Bipap for 24 hours then reevaluate respiratory status off Bipap.   Broad spectrum antibiotics  CAD / elevated Troponin. Probably demand ischemia secondary to sepsis. Will cycle troponins. Cardiology advises against aggressive treatment including heparin. Echo pending.   Hypoxic encephalopathy. Likely secondary to hypoxia / sepsis.   DNR. DNR form sent with patient from [place]. DNR status verified with sister.    Code Status: DNR DVT Prophylaxis: Lovenox Family Communication: Spoke with sister Azalee Course at [phone]  Disposition Plan:  Indeterminate, patient may not survive hospitalization  Time spent: 78 minutes  Tye Savoy, NP [facility] Pager [phone]

## 2015-05-18 NOTE — Progress Notes (Signed)
Took patient off of BiPAP and placed on 4LNC SATS remained at 96% will continue to monitor.

## 2015-05-18 NOTE — ED Provider Notes (Signed)
CSN: [id number]     Arrival date & time 04/29/2015  3614 History   First MD Initiated Contact with Patient 05/02/2015 [phone]     Chief Complaint  Patient presents with  . Shortness of Breath   Laura Benitez is a 81 y.o. female with a past medical history significant for dementia, GERD, coronary artery disease, hypothyroidism, and documented CHF with recent echo showing 65-70% ejection fraction and recent femur surgery who presents with shortness of breath. The patient is brought in by EMS for shortness of breath and hypoxia. According to EMS, the patient was short of breath this morning and on their arrival, documented oxygen saturations in the submit 60% range. The patient was quickly put on a nonrebreather and her oxygen saturations improved into the upper 80s. The patient is alert and oriented and denies any chest pain. The patient has had a slightly productive cough with some [place] sputum. The patient denied any nausea, vomiting, constipation, diarrhea, dysuria. The patient denied any leg pain. The patient denied leg pain or leg swelling or history of DVT or pulmonary embolism.   (Consider location/radiation/quality/duration/timing/severity/associated sxs/prior Treatment) Patient is a 81 y.o. female presenting with shortness of breath. The history is provided by the patient, the EMS personnel and medical records. No language interpreter was used.  Shortness of Breath Severity:  Severe Onset quality:  Gradual Duration:  3 days Timing:  Constant Progression:  Worsening Chronicity:  New Context comment:  Recent femur surgery of left leg Relieved by:  Oxygen Ineffective treatments:  None tried Associated symptoms: cough and sputum production   Associated symptoms: no abdominal pain, no chest pain, no diaphoresis, no fever, no headaches, no hemoptysis, no rash, no vomiting and no wheezing   Cough:    Cough characteristics:  Productive   Sputum characteristics:  [place]   Severity:  Moderate    Onset quality:  Gradual   Timing:  Intermittent   Progression:  Waxing and waning Risk factors: recent surgery     Past Medical History  Diagnosis Date  . Anemia   . Depression   . Anxiety   . Fracture of lumbar spine     History in 2000  . Allergy   . Migraine headache   . GERD (gastroesophageal reflux disease)   . UTI (urinary tract infection)   . Otalgia, unspecified 10/14/2012  . Stage I pressure ulcer of back 10/14/2012  . Shortness of breath 10/14/2012  . 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 Chronic fatigue syndrome 02/09/2012  . Abnormality of gait 01/30/2012  . Senile osteoporosis 12/25/2011  . Congestive heart failure, unspecified 12/10/2011  . Acute bronchitis 12/04/2011  . Closed fracture of lateral malleolus 11/27/2011  . Edema 10/30/2011  . Peripheral vascular disease, unspecified 10/20/2011  . Urinary frequency 09/25/2011  . Throat pain 08/13/2011  . Candidiasis of other urogenital sites 07/21/2011  . Unspecified hypothyroidism 07/21/2011  . Personality change due to conditions classified elsewhere 07/21/2011  . Cracked tooth 07/21/2011  . Candidiasis of mouth 07/16/2011  . Neoplasm of uncertain behavior of skin 07/16/2011  . Lumbago 07/16/2011  . Dysphagia 07/16/2011  . Hyposmolality and/or hyponatremia 07/15/2011  . Acute posthemorrhagic anemia 07/15/2011  . Major depressive disorder, single episode, unspecified 07/15/2011  . Unspecified constipation 07/15/2011  . Pain in joint, pelvic region and thigh 07/15/2011  . Other closed fractures of distal end of radius (alone) 07/15/2011  . Closed fracture of distal end of ulna (alone) 07/15/2011  . Other orthopedic aftercare(V54.89) 07/15/2011  .  CAD (coronary artery disease) 01/11/2015  . PAD (peripheral artery disease) 01/11/2015  . Alzheimer's disease 05/03/2015  . Acute blood loss anemia   . Dysphagia, pharyngoesophageal phase 05/14/2015  . Fall    Past Surgical History  Procedure Laterality Date  . Tonsillectomy       @age  4  . Total abdominal hysterectomy    . Hip arthroplasty  07/10/2011    Procedure: ARTHROPLASTY BIPOLAR HIP;  Surgeon: Rozanna Box, MD;  Location: [facility];  Service: Orthopedics;  Laterality: Right;  . Orif wrist fracture  07/10/2011    Procedure: OPEN REDUCTION INTERNAL FIXATION (ORIF) WRIST FRACTURE;  Surgeon: Rozanna Box, MD;  Location: [facility];  Service: Orthopedics;  Laterality: Right;  . Femur im nail Left 05/09/2015    Procedure: INTRAMEDULLARY (IM) NAIL FEMORAL;  Surgeon: Leandrew Koyanagi, MD;  Location: [facility];  Service: Orthopedics;  Laterality: Left;   Family History  Problem Relation Age of Onset  . Lung cancer Father   . Colon cancer Neg Hx    Social History  Substance Use Topics  . Smoking status: Never Smoker   . Smokeless tobacco: Never Used  . Alcohol Use: No   OB History    No data available     Review of Systems  Constitutional: Negative for fever, chills and diaphoresis.  HENT: Negative for congestion.   Respiratory: Positive for cough, sputum production and shortness of breath. Negative for hemoptysis, chest tightness and wheezing.   Cardiovascular: Negative for chest pain.  Gastrointestinal: Negative for vomiting and abdominal pain.  Genitourinary: Negative for dysuria.  Musculoskeletal: Negative for back pain.  Skin: Positive for wound (Left upper lateral thigh surgical wound that is stapled. No erythema, crepitance, fluctuance, induration, or drainage.). Negative for rash.  Neurological: Negative for dizziness and headaches.  Psychiatric/Behavioral: Negative for agitation.  All other systems reviewed and are negative.     Allergies  Milk-related compounds  Home Medications   Prior to Admission medications   Medication Sig Start Date End Date Taking? Authorizing Provider  ACTONEL 150 MG tablet Take 150 mg by mouth every 30 (thirty) days. Take  1 tablet once a month on the 29th of the month. 12/03/12   Historical Provider, MD  alum & mag  hydroxide-simeth ([place]) 200-200-20 MG/5ML suspension Take 30 mLs by mouth 3 (three) times daily. Take 30 ml three times daily as needed for indigestion.    Historical Provider, MD  calcium-vitamin D (OSCAL WITH D) 500-200 MG-UNIT per tablet Take 1 tablet by mouth 2 (two) times daily.    Historical Provider, MD  donepezil (ARICEPT) 5 MG tablet One nightly to help preserve memory 05/03/15   [name], MD  enoxaparin (LOVENOX) 30 MG/0.3ML injection Inject 0.3 mLs (30 mg total) into the skin daily. 05/09/15   Leandrew Koyanagi, MD  fluticasone (FLONASE) 50 MCG/ACT nasal spray Place 2 sprays into the nose daily. Daily 12/23/12   Historical Provider, MD  HYDROcodone-acetaminophen (NORCO) 7.5-325 MG per tablet Take 1-2 tablets by mouth every 6 (six) hours as needed for moderate pain. 05/09/15   Leandrew Koyanagi, MD  levothyroxine (SYNTHROID, LEVOTHROID) 50 MCG tablet Take 50 mcg by mouth daily. Take 1 tablet by mouth daily.    Historical Provider, MD  loperamide (IMODIUM) 2 MG capsule Take 2-4 mg by mouth 3 (three) times daily as needed for diarrhea or loose stools.    Historical Provider, MD  Maltodextrin-Xanthan Gum ([place]) POWD Follow directions on package for  nectar thick liquids 05/12/15   Allie Bossier, MD  MYRBETRIQ 25 MG TB24 Take 25 mg by mouth daily. Take one tablet daily 12/22/12   Historical Provider, MD  omeprazole (PRILOSEC) 40 MG capsule Take 40 mg by mouth daily.    Historical Provider, MD  Polyethyl Glycol-Propyl Glycol (SYSTANE) 0.4-0.3 % SOLN Apply 1 drop to eye every 4 (four) hours as needed (for eye discomfort). Both eyes as needed for comfort    Historical Provider, MD  protein supplement (RESOURCE BENEPROTEIN) POWD Take 1 scoop by mouth daily. MIX 1 SCOOP IN 4 OZ. OF HONEY THICK H20 AND TAKE BY MOUTH AT 2 PM.    Historical Provider, MD  psyllium (METAMUCIL) 58.6 % powder Take 1 packet by mouth daily as needed (for constipation). Take teaspoonful mixed in 8oz fluide daily as  needed    Historical Provider, MD  sertraline (ZOLOFT) 100 MG tablet One daily to help depression and anxiety 03/13/15   [name], MD  simethicone (MYLICON) 488 MG chewable tablet Chew 125 mg by mouth 3 (three) times daily as needed for flatulence.    Historical Provider, MD  sodium chloride (DEEP SEA NASAL SPRAY) 0.65 % nasal spray Place 1 spray into the nose. Use 1 spray in each nostril as needed    Historical Provider, MD  vitamin A 10000 UNIT capsule Take 10,000 Units by mouth daily.    Historical Provider, MD   BP 118/76 mmHg  Pulse 86  Temp(Src) 97.6 F (36.4 C) (Axillary)  Resp 23  SpO2 100% Physical Exam  Constitutional: She appears well-developed and well-nourished. No distress.  HENT:  Head: Normocephalic and atraumatic.  Mouth/Throat: No oropharyngeal exudate.  Eyes: Pupils are equal, round, and reactive to light.  Neck: Normal range of motion. Neck supple.  Cardiovascular: Normal heart sounds and intact distal pulses.  Tachycardia present.   No murmur heard. Pulmonary/Chest: No stridor. Tachypnea noted. She is in respiratory distress. She has decreased breath sounds in the right upper field, the right middle field and the right lower field. She has no wheezes. She has no rales. She exhibits no tenderness.  Abdominal: Soft. There is no tenderness. There is no rebound.  Musculoskeletal: She exhibits no tenderness.  Neurological: She is alert. She displays normal reflexes. She exhibits normal muscle tone.  Skin: Skin is warm. She is not diaphoretic.  Psychiatric: She has a normal mood and affect.  Nursing note and vitals reviewed.   ED Course  Procedures (including critical care time) Labs Review Labs Reviewed  CBC WITH DIFFERENTIAL/PLATELET - Abnormal; Notable for the following:    WBC 14.2 (*)    Hemoglobin 15.3 (*)    RDW 15.7 (*)    Neutro Abs 13.0 (*)    Lymphs Abs 0.6 (*)    All other components within normal limits  BASIC METABOLIC PANEL - Abnormal;  Notable for the following:    Chloride 97 (*)    Glucose, Bld 137 (*)    Anion gap 19 (*)    All other components within normal limits  BRAIN NATRIURETIC PEPTIDE - Abnormal; Notable for the following:    B Natriuretic Peptide 285.7 (*)    All other components within normal limits  TROPONIN I - Abnormal; Notable for the following:    Troponin I 1.43 (*)    All other components within normal limits  I-STAT CG4 LACTIC ACID, ED - Abnormal; Notable for the following:    Lactic Acid, Venous 4.14 (*)  All other components within normal limits  I-STAT TROPOININ, ED - Abnormal; Notable for the following:    Troponin i, poc 1.32 (*)    All other components within normal limits  I-STAT ARTERIAL BLOOD GAS, ED - Abnormal; Notable for the following:    pCO2 arterial 45.5 (*)    Bicarbonate 26.3 (*)    All other components within normal limits  I-STAT CG4 LACTIC ACID, ED - Abnormal; Notable for the following:    Lactic Acid, Venous 2.07 (*)    All other components within normal limits  I-STAT TROPOININ, ED - Abnormal; Notable for the following:    Troponin i, poc 2.06 (*)    All other components within normal limits  URINE CULTURE  CULTURE, BLOOD (ROUTINE X 2)  CULTURE, BLOOD (ROUTINE X 2)  URINALYSIS, ROUTINE W REFLEX MICROSCOPIC (NOT AT [facility])  TROPONIN I  TROPONIN I    Imaging Review Ct Angio Chest Pe W/cm &/or Wo Cm  04/27/2015   CLINICAL DATA:  Shortness of breath, low O2 sats  EXAM: CT ANGIOGRAPHY CHEST WITH CONTRAST  TECHNIQUE: Multidetector CT imaging of the chest was performed using the standard protocol during bolus administration of intravenous contrast. Multiplanar CT image reconstructions and MIPs were obtained to evaluate the vascular anatomy.  CONTRAST:  44mL OMNIPAQUE IOHEXOL 350 MG/ML SOLN  COMPARISON:  None.  FINDINGS: There is adequate opacification of the pulmonary arteries. There is no pulmonary embolus. The main pulmonary artery, right main pulmonary artery and left main  pulmonary arteries are normal in size. The heart size is enlarged. There is no pericardial effusion.  There is a moderate right and small left pleural effusion with bibasilar atelectasis. There is no pneumothorax. There are patchy ground-glass opacities bilaterally.  There is no axillary, hilar, or mediastinal adenopathy.  There is no lytic or blastic osseous lesion.  There is a severe chronic L1 vertebral body compression fracture. There is a severe age-indeterminate T8 vertebral body compression fracture. There are mild age-indeterminate T9, T10, T11 and T12 vertebral body compression fractures.  Review of the MIP images confirms the above findings.  IMPRESSION: 1. No evidence of pulmonary embolus. 2. No evidence of thoracic aortic dissection. 3. Findings most concerning for CHF.   Electronically Signed   By: Kathreen Devoid   On: 04/25/2015 11:39   I have personally reviewed and evaluated these images and lab results as part of my medical decision-making.   EKG Interpretation   Date/Time:  Friday May 18 2015 10:00:14 EDT Ventricular Rate:  117 PR Interval:  116 QRS Duration: 75 QT Interval:  332 QTC Calculation: 463 R Axis:   44 Text Interpretation:  Sinus tachycardia Atrial premature complex Probable  anteroseptal infarct, recent Lateral leads are also involved Confirmed by  [facility] MD, Corene Cornea [phone]) on 05/01/2015 10:21:39 AM      MDM   Laura Benitez is a 81 y.o. female with a past medical history significant for dementia, GERD, coronary artery disease, hypothyroidism, and documented CHF with recent echo showing 65-70% ejection fraction and recent femur surgery who presents with shortness of breath. On arrival, the patient's oxygen saturation is in the mid 80s. The patient does not appear to be any acute distress however she is highly tachycardic in the 110's to 120s. The patient was quickly placed on a nonrebreather and her oxygen saturation maintained around the upper 80s to 90% range.  On exam, the patient had decreased lung sounds in the right lung however breath sounds were heard.  The patient denied any appreciable wheezing.  The patient is DO NOT RESUSCITATE/DO NOT INTUBATE.  Physical exam did not reveal any evidence of wound infection of her recently operated on left hip. The staples appear intact with no drainage, crepitance, fluctuance, or erythema surrounding.  Given the patient's history of recent procedure, her presenting shortness of breath, hypoxia, and tachycardia, initial concern is for pulmonary embolism. Given the patient's recent hospital stay, hospital-acquired pneumonia is also considered. As the patient denies chest pain, ACS less likely but will get diagnostic laboratory imaging and EKG studies to evaluate.  The patient was taken to the CT scanner to look for pulmonary embolism. The patient was placed on BiPAP after return from the imaging suite and her options saturations improved into the upper 90's% range.  The patient had blood work returned showing an elevated lactic acid, elevated white blood cell count and the setting of her symptoms, the patient was given vancomycin, Zosyn and blood cultures for her presumed pneumonia.  The patient's CT scan did not show evidence of pulmonary embolism however, it did show evidence of patchy groundglass opacities bilaterally as well as concern for CHF. As the patient's BNP is only 285 and the patient is not having any generalized edema, initial concern is for pneumonia over CHF.   Following the diagnostic workup completion, the patient will be admitted to the hospitalist internal medicine service for further management.     Final diagnoses:  Healthcare-associated pneumonia        Antony Blackbird, MD 04/28/2015 1523  Merrily Pew, MD 05/20/15 [id number]

## 2015-05-18 NOTE — Progress Notes (Signed)
ANTIBIOTIC CONSULT NOTE - INITIAL  Pharmacy Consult for vancomycin Indication: HCAP  Allergies  Allergen Reactions  . Milk-Related Compounds Other (See Comments)    Milk products cause patient to have migraine headaches.  Pt can tolerate butter.    Patient Measurements:    Vital Signs: Temp: 97.6 F (36.4 C) (09/30 0953) Temp Source: Axillary (09/30 0953) BP: 118/82 mmHg (09/30 1545) Pulse Rate: 95 (09/30 1545) Intake/Output from previous day:   Intake/Output from this shift: Total I/O In: 1200 [I.V.:1200] Out: -   Labs:  Recent Labs  05/09/2015 0959  WBC 14.2*  HGB 15.3*  PLT 322  CREATININE 0.79   Estimated Creatinine Clearance: 35.6 mL/min (by C-G formula based on Cr of 0.79). No results for input(s): VANCOTROUGH, VANCOPEAK, VANCORANDOM, GENTTROUGH, GENTPEAK, GENTRANDOM, TOBRATROUGH, TOBRAPEAK, TOBRARND, AMIKACINPEAK, AMIKACINTROU, AMIKACIN in the last 72 hours.   Microbiology: Recent Results (from the past 720 hour(s))  MRSA PCR Screening     Status: None   Collection Time: 05/05/15  7:13 PM  Result Value Ref Range Status   MRSA by PCR NEGATIVE NEGATIVE Final    Comment:        The GeneXpert MRSA Assay (FDA approved for NASAL specimens only), is one component of a comprehensive MRSA colonization surveillance program. It is not intended to diagnose MRSA infection nor to guide or monitor treatment for MRSA infections.   C difficile quick scan w PCR reflex     Status: None   Collection Time: 05/08/15  5:45 AM  Result Value Ref Range Status   C Diff antigen NEGATIVE NEGATIVE Final   C Diff toxin NEGATIVE NEGATIVE Final   C Diff interpretation Negative for toxigenic C. difficile  Final    Medical History: Past Medical History  Diagnosis Date  . Anemia   . Depression   . Anxiety   . Fracture of lumbar spine     History in 2000  . Allergy   . Migraine headache   . GERD (gastroesophageal reflux disease)   . UTI (urinary tract infection)   .  Otalgia, unspecified 10/14/2012  . Stage I pressure ulcer of back 10/14/2012  . 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 Chronic fatigue syndrome 02/09/2012  . Abnormality of gait 01/30/2012  . Senile osteoporosis 12/25/2011  . Congestive heart failure, unspecified 12/10/2011  . Acute bronchitis 12/04/2011  . Closed fracture of lateral malleolus 11/27/2011  . Peripheral vascular disease, unspecified 10/20/2011  . Urinary frequency 09/25/2011  . Throat pain 08/13/2011  . Candidiasis of other urogenital sites 07/21/2011  . Unspecified hypothyroidism 07/21/2011  . Personality change due to conditions classified elsewhere 07/21/2011  . Cracked tooth 07/21/2011  . Candidiasis of mouth 07/16/2011  . Neoplasm of uncertain behavior of skin 07/16/2011  . Lumbago 07/16/2011  . Hyposmolality and/or hyponatremia 07/15/2011  . Acute posthemorrhagic anemia 07/15/2011  . Major depressive disorder, single episode, unspecified 07/15/2011  . Unspecified constipation 07/15/2011  . Other closed fractures of distal end of radius (alone) 07/15/2011  . Closed fracture of distal end of ulna (alone) 07/15/2011  . Other orthopedic aftercare(V54.89) 07/15/2011  . CAD (coronary artery disease) 01/11/2015  . PAD (peripheral artery disease) 01/11/2015  . Alzheimer's disease 05/03/2015  . Acute blood loss anemia   . Dysphagia, pharyngoesophageal phase 05/14/2015  . Fall     Assessment: 66 yof s/p ORIF of L femur on 05/09/15 - now presenting with SOB, hypoxia. Pharmacy consulted to dose vancomycin for HCAP/sepsis. Also on ceftaz per MD. Palliative care consult for  goals of care. If no improvement, may be full comfort care in next 24-48h. Afebrile, wbc 14.2. SCr 0.79 on admit, CrCl~35.  9/30 vanc>> 9/30 ceftaz>> 9/30 zosyn x 1 dose  9/30 BCx2>> 9/30 UC>>  Goal of Therapy:  Vancomycin trough level 15-20 mcg/ml  Plan:  Given Vanc 1g x 1 ED; then Vanc 500mg  IV q24h Zosyn 3.375 x 1 in ED; then Ceftaz to 2g IV q12h per MD Mon  clinical progress, c/s, renal function, abx plan/LOT VT@SS  as indicated F/u goals of care  Elicia Lamp, PharmD Clinical Pharmacist Pager [phone] 04/26/2015 4:26 PM

## 2015-05-18 NOTE — Progress Notes (Signed)
Echocardiogram personally reviewed - it appears she has had an anterior MI over the past week (last echo was 9 days ago which showed normal EF and wall motion). Troponin was already elevated on admission. LVEF is reduced now to around 35% with LAD territory [facility] -formal read pending. Continue with diuresis, rectal aspirin - agree with palliative care consultation.  Pixie Casino, MD, [facility] Attending Cardiologist [place]

## 2015-05-18 NOTE — Progress Notes (Signed)
RT NTS patient without complications.

## 2015-05-18 NOTE — ED Notes (Signed)
[facility] from [facility] with c/o sob , pt sats were in the 60's on room air pt placed on a nrb sats up to the mid 80's sats 88 % on arrival pt has a baseline alzheimer's

## 2015-05-18 NOTE — ED Notes (Signed)
Bipap removed from pt by respiratory therapist due to pt coughing up a great amount of sputum. Pt seems to be breathing better and is more alert, O2 sats on 4L are 98%. Per MD, pt may remain off of Bipap unless she begins to desat.

## 2015-05-18 NOTE — Progress Notes (Signed)
  Echocardiogram 2D Echocardiogram has been performed.  Darlina Sicilian M 05/11/2015, 1:07 PM

## 2015-05-18 NOTE — Consult Note (Addendum)
CONSULTATION NOTE  Reason for Consult: Elevated troponin, acute respiratory failure  Requesting Physician: Dr. Dayna Barker  Cardiologist: Dr. Johnsie Cancel  HPI: This is a 81 y.o. [facility] multiple medical problems including Alzheimer's dementia. She does have a DO NOT RESUSCITATE order. She had seen Dr. Johnsie Cancel last in 2013 for questionable circulation problems. She was not thought to have claudication symptoms and has some changes related to chronic venous insufficiency. It was felt that no further cardiology follow-up as necessary. Today she presented to the emergency room and [facility] with acute hypoxic respiratory failure. Her oxygen saturation was 60% on admission. She recently had a left hip fracture and underwent open reduction and internal fixation of the left femur on 05/09/2015 by Dr. Erlinda Hong. On admission she was tachycardic and oxygen saturations came up to 89% on nonrebreather mask. Labs indicate a mild leukocytosis at 14,200, and a mildly elevated BNP of 285. Troponin is elevated at 1.32 with a venous lactate of 4.14. ABG indicates pH 7.37, PCO2 45.5, PO2 95, bicarbonate 26.3. EKG shows sinus tachycardia with small Q waves in the anterolateral and high lateral leads, with J-point elevation and a Q-wave in V2 - suggestive of possible recent anteroseptal MI, however this is minimally changed compared to her EKG on 05/06/2015. She was sent for an urgent CT angiogram to evaluate for pulmonary embolus given her recent hip surgery. Subsequently, due to declining mental status and difficult oxygenation she was placed on BiPAP. Cardiology is asked to consult regarding elevated troponin, acute respiratory failure and EKG changes.  PMHx: Past Medical History  Diagnosis Date  . Anemia   . Depression   . Anxiety   . Fracture of lumbar spine     History in 2000  . Allergy   . Migraine headache   . GERD (gastroesophageal reflux disease)   . UTI (urinary tract infection)     . Otalgia, unspecified 10/14/2012  . Stage I pressure ulcer of back 10/14/2012  . Shortness of breath 10/14/2012  . 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 Chronic fatigue syndrome 02/09/2012  . Abnormality of gait 01/30/2012  . Senile osteoporosis 12/25/2011  . Congestive heart failure, unspecified 12/10/2011  . Acute bronchitis 12/04/2011  . Closed fracture of lateral malleolus 11/27/2011  . Edema 10/30/2011  . Peripheral vascular disease, unspecified 10/20/2011  . Urinary frequency 09/25/2011  . Throat pain 08/13/2011  . Candidiasis of other urogenital sites 07/21/2011  . Unspecified hypothyroidism 07/21/2011  . Personality change due to conditions classified elsewhere 07/21/2011  . Cracked tooth 07/21/2011  . Candidiasis of mouth 07/16/2011  . Neoplasm of uncertain behavior of skin 07/16/2011  . Lumbago 07/16/2011  . Dysphagia 07/16/2011  . Hyposmolality and/or hyponatremia 07/15/2011  . Acute posthemorrhagic anemia 07/15/2011  . Major depressive disorder, single episode, unspecified 07/15/2011  . Unspecified constipation 07/15/2011  . Pain in joint, pelvic region and thigh 07/15/2011  . Other closed fractures of distal end of radius (alone) 07/15/2011  . Closed fracture of distal end of ulna (alone) 07/15/2011  . Other orthopedic aftercare(V54.89) 07/15/2011  . CAD (coronary artery disease) 01/11/2015  . PAD (peripheral artery disease) 01/11/2015  . Alzheimer's disease 05/03/2015  . Acute blood loss anemia   . Dysphagia, pharyngoesophageal phase 05/14/2015  . Fall    Past Surgical History  Procedure Laterality Date  . Tonsillectomy      @age  4  . Total abdominal hysterectomy    . Hip arthroplasty  07/10/2011    Procedure: ARTHROPLASTY  BIPOLAR HIP;  Surgeon: Rozanna Box, MD;  Location: [facility];  Service: Orthopedics;  Laterality: Right;  . Orif wrist fracture  07/10/2011    Procedure: OPEN REDUCTION INTERNAL FIXATION (ORIF) WRIST FRACTURE;  Surgeon: Rozanna Box, MD;  Location: [facility];  Service: Orthopedics;  Laterality: Right;  . Femur im nail Left 05/09/2015    Procedure: INTRAMEDULLARY (IM) NAIL FEMORAL;  Surgeon: Leandrew Koyanagi, MD;  Location: [facility];  Service: Orthopedics;  Laterality: Left;    FAMHx: Family History  Problem Relation Age of Onset  . Lung cancer Father   . Colon cancer Neg Hx     SOCHx:  reports that she has never smoked. She has never used smokeless tobacco. She reports that she does not drink alcohol or use illicit drugs.  ALLERGIES: Allergies  Allergen Reactions  . Milk-Related Compounds Other (See Comments)    Milk products cause patient to have migraine headaches.  Pt can tolerate butter.    ROS: Review of systems not obtained due to patient factors. (patient on BiPAP, minimally responsive)  HOME MEDICATIONS:   Medication List    ASK your doctor about these medications        ACTONEL 150 MG tablet  Generic drug:  risedronate  Take 150 mg by mouth every 30 (thirty) days. Take  1 tablet once a month on the 29th of the month.     calcium-vitamin D 500-200 MG-UNIT tablet  Commonly known as:  OSCAL WITH D  Take 1 tablet by mouth 2 (two) times daily.     DEEP SEA NASAL SPRAY 0.65 % nasal spray  Generic drug:  sodium chloride  Place 1 spray into the nose. Use 1 spray in each nostril as needed     donepezil 5 MG tablet  Commonly known as:  ARICEPT  One nightly to help preserve memory     enoxaparin 30 MG/0.3ML injection  Commonly known as:  LOVENOX  Inject 0.3 mLs (30 mg total) into the skin daily.     fluticasone 50 MCG/ACT nasal spray  Commonly known as:  FLONASE  Place 2 sprays into the nose daily. Daily     HYDROcodone-acetaminophen 7.5-325 MG tablet  Commonly known as:  NORCO  Take 1-2 tablets by mouth every 6 (six) hours as needed for moderate pain.     levothyroxine 50 MCG tablet  Commonly known as:  SYNTHROID, LEVOTHROID  Take 50 mcg by mouth daily. Take 1 tablet by mouth daily.     loperamide 2 MG capsule    Commonly known as:  IMODIUM  Take 2-4 mg by mouth 3 (three) times daily as needed for diarrhea or loose stools.     [place] 200-200-20 MG/5ML suspension  Generic drug:  alum & mag hydroxide-simeth  Take 30 mLs by mouth 3 (three) times daily. Take 30 ml three times daily as needed for indigestion.     MYRBETRIQ 25 MG Tb24 tablet  Generic drug:  mirabegron ER  Take 25 mg by mouth daily. Take one tablet daily     omeprazole 40 MG capsule  Commonly known as:  PRILOSEC  Take 40 mg by mouth daily.     protein supplement Powd  Take 1 scoop by mouth daily. MIX 1 SCOOP IN 4 OZ. OF HONEY THICK H20 AND TAKE BY MOUTH AT 2 PM.     psyllium 58.6 % powder  Commonly known as:  METAMUCIL  Take 1 packet by mouth daily as needed (for constipation). Take  teaspoonful mixed in 8oz fluide daily as needed     [place]  Follow directions on package for nectar thick liquids     sertraline 100 MG tablet  Commonly known as:  ZOLOFT  One daily to help depression and anxiety     simethicone 125 MG chewable tablet  Commonly known as:  MYLICON  Chew 106 mg by mouth 3 (three) times daily as needed for flatulence.     SYSTANE 0.4-0.3 % Soln  Generic drug:  Polyethyl Glycol-Propyl Glycol  Apply 1 drop to eye every 4 (four) hours as needed (for eye discomfort). Both eyes as needed for comfort     vitamin A 10000 UNIT capsule  Take 10,000 Units by mouth daily.       HOSPITAL MEDICATIONS: I have reviewed the patient's current medications.  VITALS: Blood pressure 107/74, pulse 93, temperature 97.6 F (36.4 C), temperature source Axillary, resp. rate 22, SpO2 100 %.  PHYSICAL EXAM: General appearance: Frail, pale appearing elderly female with minimal responsiveness on BiPAP Neck: no carotid bruit and no JVD Lungs: dullness to percussion bibasilar, egophony RLL and rhonchi bilaterally Heart: Regular tachycardia Abdomen: soft, non-tender; bowel sounds normal; no masses,  no  organomegaly Extremities: extremities normal, atraumatic, no cyanosis or edema and venous stasis dermatitis noted Pulses: 2+ and symmetric Skin: Pale, cool, dry Neurologic: Mental status: Minimal responsiveness to verbal stimulation, no withdrawal to pain Psych: Unable to assess, previous history of dementia  LABS: Results for orders placed or performed during the hospital encounter of 05/06/2015 (from the past 48 hour(s))  CBC with Differential     Status: Abnormal   Collection Time: 04/23/2015  9:59 AM  Result Value Ref Range   WBC 14.2 (H) 4.0 - 10.5 K/uL   RBC 4.85 3.87 - 5.11 MIL/uL   Hemoglobin 15.3 (H) 12.0 - 15.0 g/dL   HCT 45.5 36.0 - 46.0 %   MCV 93.8 78.0 - 100.0 fL   MCH 31.5 26.0 - 34.0 pg   MCHC 33.6 30.0 - 36.0 g/dL   RDW 15.7 (H) 11.5 - 15.5 %   Platelets 322 150 - 400 K/uL   Neutrophils Relative % 92 %   Neutro Abs 13.0 (H) 1.7 - 7.7 K/uL   Lymphocytes Relative 4 %   Lymphs Abs 0.6 (L) 0.7 - 4.0 K/uL   Monocytes Relative 4 %   Monocytes Absolute 0.6 0.1 - 1.0 K/uL   Eosinophils Relative 0 %   Eosinophils Absolute 0.0 0.0 - 0.7 K/uL   Basophils Relative 0 %   Basophils Absolute 0.0 0.0 - 0.1 K/uL  Basic metabolic panel     Status: Abnormal   Collection Time: 05/15/2015  9:59 AM  Result Value Ref Range   Sodium 139 135 - 145 mmol/L   Potassium 4.3 3.5 - 5.1 mmol/L   Chloride 97 (L) 101 - 111 mmol/L   CO2 23 22 - 32 mmol/L   Glucose, Bld 137 (H) 65 - 99 mg/dL   BUN 16 6 - 20 mg/dL   Creatinine, Ser 0.79 0.44 - 1.00 mg/dL   Calcium 8.9 8.9 - 10.3 mg/dL   GFR calc non Af Amer >60 >60 mL/min   GFR calc Af Amer >60 >60 mL/min    Comment: (NOTE) The eGFR has been calculated using the CKD EPI equation. This calculation has not been validated in all clinical situations. eGFR's persistently <60 mL/min signify possible Chronic Kidney Disease.    Anion gap 19 (H) 5 -  15  Brain natriuretic peptide     Status: Abnormal   Collection Time: 05/05/2015  9:59 AM  Result  Value Ref Range   B Natriuretic Peptide 285.7 (H) 0.0 - 100.0 pg/mL  I-Stat Troponin, ED - 0, 3, 6 hours (not at [facility])     Status: Abnormal   Collection Time: 04/23/2015 10:10 AM  Result Value Ref Range   Troponin i, poc 1.32 (HH) 0.00 - 0.08 ng/mL   Comment NOTIFIED PHYSICIAN    Comment 3            Comment: Due to the release kinetics of cTnI, a negative result within the first hours of the onset of symptoms does not rule out myocardial infarction with certainty. If myocardial infarction is still suspected, repeat the test at appropriate intervals.   I-Stat CG4 Lactic Acid, ED     Status: Abnormal   Collection Time: 04/20/2015 10:13 AM  Result Value Ref Range   Lactic Acid, Venous 4.14 (HH) 0.5 - 2.0 mmol/L   Comment NOTIFIED PHYSICIAN   I-Stat arterial blood gas, ED     Status: Abnormal   Collection Time: 05/05/2015 11:01 AM  Result Value Ref Range   pH, Arterial 7.370 7.350 - 7.450   pCO2 arterial 45.5 (H) 35.0 - 45.0 mmHg   pO2, Arterial 95.0 80.0 - 100.0 mmHg   Bicarbonate 26.3 (H) 20.0 - 24.0 mEq/L   TCO2 28 0 - 100 mmol/L   O2 Saturation 97.0 %   Acid-Base Excess 1.0 0.0 - 2.0 mmol/L   Patient temperature 98.6 F    Collection site RADIAL, ALLEN'S TEST ACCEPTABLE    Drawn by Operator    Sample type ARTERIAL     IMAGING: No results found.  HOSPITAL DIAGNOSES: Principal Problem:   Acute respiratory failure with hypoxia Active Problems:   DNR (do not resuscitate)   CAD (coronary artery disease)   PAD (peripheral artery disease)   Alzheimer's disease   Protein-calorie malnutrition, severe   Elevated troponin I level   Bilateral pneumonia   IMPRESSION: 1. Acute hypoxic respiratory failure on BiPAP, secondary to pulmonary edema with possible underlying consolidation  2. Elevated troponin I secondary to pneumonia and likely demand ischemia, possible recent acute coronary syndrome with superimposed congestive heart failure 3. Alzheimer's dementia with a DO NOT  RESUSCITATE order 4. Severe protein calorie malnutrition with significant frailty 5. History of PVD  RECOMMENDATION: 1. Mrs. Cancro presents with acute hypoxic respiratory failure and CT findings suggestive of significant bilateral pleural effusion and/or consolidation, with possible pneumonia (my interpretation). BNP is only mildly elevated. She is currently requiring BiPAP for oxygenation with a declining mental status. She may not be able to support her airway. She does have a DO NOT RESUSCITATE order and given her multiple co-morbidities including Alzheimer's dementia and severe protein calorie malnutrition, intubation and mechanical ventilation may not be consistent with her wishes. Her troponin is elevated and may represent out of hospital MI versus demand ischemia, or related to congestive heart failure. She is not a candidate for cardiac intervention. Consider a 2-D echocardiogram for prognostic value. Consider IV diuresis and low-dose aspirin if or when she can take po medications. I would not start IV heparin at this point. I agree with broad-spectrum anti-biotics. A palliative care consultation may be helpful.  Thanks for consulting [place].  Please note she has a DNR order signed by her PCP.  Time Spent Directly with Patient: 45 minutes  Pixie Casino, MD, [facility] Attending Cardiologist [facility]  [facility]  Pixie Casino 05/06/2015, 11:37 AM

## 2015-05-19 ENCOUNTER — Inpatient Hospital Stay (HOSPITAL_COMMUNITY): Payer: Medicare Other

## 2015-05-19 DIAGNOSIS — I214 Non-ST elevation (NSTEMI) myocardial infarction: Secondary | ICD-10-CM

## 2015-05-19 DIAGNOSIS — Z515 Encounter for palliative care: Secondary | ICD-10-CM | POA: Insufficient documentation

## 2015-05-19 DIAGNOSIS — A419 Sepsis, unspecified organism: Principal | ICD-10-CM

## 2015-05-19 LAB — COMPREHENSIVE METABOLIC PANEL
ALK PHOS: 108 U/L (ref 38–126)
ALT: 16 U/L (ref 14–54)
AST: 33 U/L (ref 15–41)
Albumin: 2.8 g/dL — ABNORMAL LOW (ref 3.5–5.0)
Anion gap: 16 — ABNORMAL HIGH (ref 5–15)
BILIRUBIN TOTAL: 2.1 mg/dL — AB (ref 0.3–1.2)
BUN: 13 mg/dL (ref 6–20)
CALCIUM: 8 mg/dL — AB (ref 8.9–10.3)
CO2: 28 mmol/L (ref 22–32)
Chloride: 94 mmol/L — ABNORMAL LOW (ref 101–111)
Creatinine, Ser: 0.69 mg/dL (ref 0.44–1.00)
GFR calc Af Amer: >60 mL/min (ref >60)
Glucose, Bld: 125 mg/dL — ABNORMAL HIGH (ref 65–99)
POTASSIUM: 2.8 mmol/L — AB (ref 3.5–5.1)
Sodium: 138 mmol/L (ref 135–145)
TOTAL PROTEIN: 5.5 g/dL — AB (ref 6.5–8.1)

## 2015-05-19 LAB — TROPONIN I: Troponin I: 0.96 ng/mL (ref <0.031)

## 2015-05-19 LAB — CBC
HEMATOCRIT: 39.1 % (ref 36.0–46.0)
Hemoglobin: 13.3 g/dL (ref 12.0–15.0)
MCH: 31.4 pg (ref 26.0–34.0)
MCHC: 34 g/dL (ref 30.0–36.0)
MCV: 92.4 fL (ref 78.0–100.0)
Platelets: 274 10*3/uL (ref 150–400)
RBC: 4.23 MIL/uL (ref 3.87–5.11)
RDW: 15.7 % — AB (ref 11.5–15.5)
WBC: 13.6 10*3/uL — AB (ref 4.0–10.5)

## 2015-05-19 LAB — DIFFERENTIAL
BASOS PCT: 0 %
Basophils Absolute: 0 10*3/uL (ref 0.0–0.1)
EOS ABS: 0 10*3/uL (ref 0.0–0.7)
EOS PCT: 0 %
Lymphocytes Relative: 5 %
Lymphs Abs: 0.7 10*3/uL (ref 0.7–4.0)
MONO ABS: 0.7 10*3/uL (ref 0.1–1.0)
Monocytes Relative: 5 %
Neutro Abs: 12.2 10*3/uL — ABNORMAL HIGH (ref 1.7–7.7)
Neutrophils Relative %: 90 %

## 2015-05-19 LAB — HIV ANTIBODY (ROUTINE TESTING W REFLEX): HIV Screen 4th Generation wRfx: NONREACTIVE

## 2015-05-19 MED ORDER — POTASSIUM CHLORIDE 10 MEQ/100ML IV SOLN
10.0000 meq | INTRAVENOUS | Status: AC
  Administered 2015-05-19 (×6): 10 meq via INTRAVENOUS
  Filled 2015-05-19 (×2): qty 100

## 2015-05-19 MED ORDER — METOPROLOL TARTRATE 1 MG/ML IV SOLN
2.5000 mg | Freq: Two times a day (BID) | INTRAVENOUS | Status: DC
Start: 2015-05-19 — End: 2015-05-20
  Administered 2015-05-19 (×2): 2.5 mg via INTRAVENOUS
  Filled 2015-05-19 (×4): qty 5

## 2015-05-19 MED ORDER — MAGNESIUM SULFATE 2 GM/50ML IV SOLN
2.0000 g | Freq: Once | INTRAVENOUS | Status: AC
  Administered 2015-05-19: 2 g via INTRAVENOUS
  Filled 2015-05-19: qty 50

## 2015-05-19 NOTE — Consult Note (Signed)
SUBJECTIVE: Able to switch from BiPAP to ventimask.  At this time, she nods yes when asked if she is feeling well.  . aspirin  300 mg Rectal Daily  . cefTAZidime (FORTAZ)  IV  2 g Intravenous Q12H  . chlorhexidine  15 mL Mouth Rinse BID  . enoxaparin (LOVENOX) injection  30 mg Subcutaneous Q24H  . levothyroxine  25 mcg Intravenous Daily  . magnesium sulfate 1 - 4 g bolus IVPB  2 g Intravenous Once  . metoprolol  2.5 mg Intravenous Q12H  . potassium chloride  10 mEq Intravenous Q1 Hr x 6  . vancomycin  500 mg Intravenous Q24H      OBJECTIVE: Physical Exam: Filed Vitals:   05/19/15 0316 05/19/15 0411 05/19/15 0803 05/19/15 1150  BP:  99/52 110/70   Pulse: 83 85 91 89  Temp:  98.3 F (36.8 C) 97.5 F (36.4 C)   TempSrc:  Axillary Oral   Resp: 23 22 20 24   Height:      Weight:      SpO2: 99% 99% 97% 95%    Intake/Output Summary (Last 24 hours) at 05/19/15 1152 Last data filed at 04/23/2015 1232  Gross per 24 hour  Intake   1200 ml  Output      0 ml  Net   1200 ml    Telemetry reveals sinus rhythm  GEN- The patient is well appearing, alert and oriented x 3 today.   Head- normocephalic, atraumatic Eyes-  Sclera clear, conjunctiva pink Ears- hearing intact Oropharynx- clear Neck- supple, no JVP Lymph- no cervical lymphadenopathy Lungs- Clear to ausculation bilaterally, normal work of breathing Heart- Regular rate and rhythm, no murmurs, rubs or gallops, PMI not laterally displaced GI- soft, NT, ND, + BS Extremities- no clubbing, cyanosis, or edema Skin- no rash or lesion Psych- euthymic mood, full affect Neuro- strength and sensation are intact  LABS: Basic Metabolic Panel:  Recent Labs  05/03/2015 0959 05/19/15 0105  NA 139 138  K 4.3 2.8*  CL 97* 94*  CO2 23 28  GLUCOSE 137* 125*  BUN 16 13  CREATININE 0.79 0.69  CALCIUM 8.9 8.0*   Liver Function Tests:  Recent Labs  05/19/15 0105  AST 33  ALT 16  ALKPHOS 108  BILITOT 2.1*  PROT 5.5*    ALBUMIN 2.8*   No results for input(s): LIPASE, AMYLASE in the last 72 hours. CBC:  Recent Labs  04/19/2015 0959 05/19/15 0105  WBC 14.2* 13.6*  NEUTROABS 13.0* 12.2*  HGB 15.3* 13.3  HCT 45.5 39.1  MCV 93.8 92.4  PLT 322 274   Cardiac Enzymes:  Recent Labs  05/11/2015 1316 05/05/2015 1905 05/19/15 0105  TROPONINI 1.43* 1.23* 0.96*   BNP: Invalid input(s): POCBNP D-Dimer: No results for input(s): DDIMER in the last 72 hours. Hemoglobin A1C: No results for input(s): HGBA1C in the last 72 hours. Fasting Lipid Panel: No results for input(s): CHOL, HDL, LDLCALC, TRIG, CHOLHDL, LDLDIRECT in the last 72 hours. Thyroid Function Tests: No results for input(s): TSH, T4TOTAL, T3FREE, THYROIDAB in the last 72 hours.  Invalid input(s): FREET3 Anemia Panel: No results for input(s): VITAMINB12, FOLATE, FERRITIN, TIBC, IRON, RETICCTPCT in the last 72 hours.  RADIOLOGY: Ct Head Wo Contrast  05/05/2015   CLINICAL DATA:  80 year old female with fall  EXAM: CT HEAD WITHOUT CONTRAST  CT CERVICAL SPINE WITHOUT CONTRAST  TECHNIQUE: Multidetector CT imaging of the head and cervical spine was performed following the standard protocol without intravenous contrast. Multiplanar CT  image reconstructions of the cervical spine were also generated.  COMPARISON:  None.  FINDINGS: CT HEAD FINDINGS  There is mild dilatation of the ventricles and sulci compatible with age-related atrophy. Periventricular and deep white matter hypodensities represent chronic microvascular ischemic changes. Left frontal old infarct and encephalomalacia. There is a small focal area of encephalomalacia changes in the anterior left temporal lobe, likely related to an old insult. There is no intracranial hemorrhage. No mass effect or midline shift identified.  The visualized paranasal sinuses and mastoid air cells are well aerated. A small focal lucency noted in the occiput. The calvarium is intact.  CT CERVICAL SPINE FINDINGS  There  is no acute fracture or subluxation of the cervical spine.There is advanced osteopenia. Multilevel degenerative changes.The odontoid and spinous processes are intact.There is normal anatomic alignment of the C1-C2 lateral masses. The visualized soft tissues appear unremarkable.  IMPRESSION: No acute intracranial pathology.  Age-related atrophy and chronic microvascular ischemic disease. 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  Advanced osteopenia.  No acute/traumatic cervical spine pathology.   Electronically Signed   By: Anner Crete M.D.   On: 05/05/2015 03:58   Ct Angio Chest Pe W/cm &/or Wo Cm  05/11/2015   CLINICAL DATA:  Shortness of breath, low O2 sats  EXAM: CT ANGIOGRAPHY CHEST WITH CONTRAST  TECHNIQUE: Multidetector CT imaging of the chest was performed using the standard protocol during bolus administration of intravenous contrast. Multiplanar CT image reconstructions and MIPs were obtained to evaluate the vascular anatomy.  CONTRAST:  50mL OMNIPAQUE IOHEXOL 350 MG/ML SOLN  COMPARISON:  None.  FINDINGS: There is adequate opacification of the pulmonary arteries. There is no pulmonary embolus. The main pulmonary artery, right main pulmonary artery and left main pulmonary arteries are normal in size. The heart size is enlarged. There is no pericardial effusion.  There is a moderate right and small left pleural effusion with bibasilar atelectasis. There is no pneumothorax. There are patchy ground-glass opacities bilaterally.  There is no axillary, hilar, or mediastinal adenopathy.  There is no lytic or blastic osseous lesion.  There is a severe chronic L1 vertebral body compression fracture. There is a severe age-indeterminate T8 vertebral body compression fracture. There are mild age-indeterminate T9, T10, T11 and T12 vertebral body compression fractures.  Review of the MIP images confirms the above findings.  IMPRESSION: 1. No evidence of pulmonary embolus. 2. No evidence of thoracic  aortic dissection. 3. Findings most concerning for CHF.   Electronically Signed   By: Kathreen Devoid   On: 05/05/2015 11:39   Ct Cervical Spine Wo Contrast  05/05/2015   CLINICAL DATA:  81 year old female with fall  EXAM: CT HEAD WITHOUT CONTRAST  CT CERVICAL SPINE WITHOUT CONTRAST  TECHNIQUE: Multidetector CT imaging of the head and cervical spine was performed following the standard protocol without intravenous contrast. Multiplanar CT image reconstructions of the cervical spine were also generated.  COMPARISON:  None.  FINDINGS: CT HEAD FINDINGS  There is mild dilatation of the ventricles and sulci compatible with age-related atrophy. Periventricular and deep white matter hypodensities represent chronic microvascular ischemic changes. Left frontal old infarct and encephalomalacia. There is a small focal area of encephalomalacia changes in the anterior left temporal lobe, likely related to an old insult. There is no intracranial hemorrhage. No mass effect or midline shift identified.  The visualized paranasal sinuses and mastoid air cells are well aerated. A small focal lucency noted in the occiput. The calvarium is intact.  CT CERVICAL SPINE  FINDINGS  There is no acute fracture or subluxation of the cervical spine.There is advanced osteopenia. Multilevel degenerative changes.The odontoid and spinous processes are intact.There is normal anatomic alignment of the C1-C2 lateral masses. The visualized soft tissues appear unremarkable.  IMPRESSION: No acute intracranial pathology.  Age-related atrophy and chronic microvascular ischemic disease. 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  Advanced osteopenia.  No acute/traumatic cervical spine pathology.   Electronically Signed   By: Anner Crete M.D.   On: 05/05/2015 03:58   Ct Pelvis Wo Contrast  05/05/2015   CLINICAL DATA:  Left hip pain after fall. Concern for left hip fracture.  EXAM: CT PELVIS WITHOUT CONTRAST  TECHNIQUE: Multidetector CT imaging  of the pelvis was performed following the standard protocol without intravenous contrast.  COMPARISON:  Radiographs earlier this day.  FINDINGS: There is a minimally comminuted and minimally displaced fracture of the intertrochanteric left femur primarily involving the greater trochanter. No extension of the femoral neck. The femoral head is intact. Severe osteoporosis. Right hip arthroplasty in place. Remote fracture of the right and left inferior pubic rami. Bony pelvis is otherwise intact. Urinary bladder is significantly distended.  IMPRESSION: Minimally comminuted and displaced intertrochanteric left hip fracture. The femoral head is intact.   Electronically Signed   By: Jeb Levering M.D.   On: 05/05/2015 06:25   Dg Chest Port 1 View  05/19/2015   CLINICAL DATA:  Healthcare associated pneumonia  EXAM: PORTABLE CHEST 1 VIEW  COMPARISON:  Yesterday  FINDINGS: Bibasilar airspace disease with bronchograms. Superimposed small to moderate pleural effusions. Stable heart size and mediastinal contours when allowing for rotation.  IMPRESSION: Bibasilar pneumonia with small to moderate pleural effusion, similar to yesterday. Pattern and extensive airway filling on yesterday CT raises concern for underlying aspiration.   Electronically Signed   By: Monte Fantasia M.D.   On: 05/19/2015 06:13   Dg Chest Port 1 View  05/15/2015   CLINICAL DATA:  Hypoxia/respiratory failure  EXAM: PORTABLE CHEST 1 VIEW  COMPARISON:  Chest CT May 18, 2015; chest radiograph July 10, 2011  FINDINGS: There are bilateral pleural effusions with relatively mild interstitial edema. Heart is mildly enlarged. There is mild pulmonary venous hypertension. There is patchy bibasilar atelectasis.  Bones are osteoporotic. There is increase in kyphosis. No adenopathy apparent.  IMPRESSION: Findings consistent with congestive heart failure. There is no apparent change compared to CT obtained earlier in the day.   Electronically Signed    By: Lowella Grip III M.D.   On: 04/28/2015 19:52   Dg Hip Operative Unilat With Pelvis Left  05/09/2015   CLINICAL DATA:  Left hip fracture.  EXAM: OPERATIVE left HIP (WITH PELVIS IF PERFORMED) 2 VIEWS  TECHNIQUE: Fluoroscopic spot image(s) were submitted for interpretation post-operatively.  COMPARISON:  Radiographs and CT scan dated 05/05/2015  FINDINGS: The patient has undergone open reduction and internal fixation of the proximal left femur fracture. Two screws and and intramedullary nail have been inserted and appear in excellent position. Alignment and position of the fracture fragments is anatomic.  IMPRESSION: Open reduction and internal fixation of left femur fracture.   Electronically Signed   By: Lorriane Shire M.D.   On: 05/09/2015 17:36   Dg Hip Unilat With Pelvis 2-3 Views Left  05/05/2015   CLINICAL DATA:  81 year old female with fall and left hip pain.  EXAM: DG HIP (WITH OR WITHOUT PELVIS) 2-3V LEFT  COMPARISON:  None.  FINDINGS: There is severe osteopenia which limits evaluation of  the study for fracture. There is a right total hip arthroplasty. There is a focal area of cortical discontinue the in the medial portion of the left femoral head, likely artifactual. A cortical fracture is less likely. Clinical correlation is recommended. No definite fracture identified. There is no dislocation on the provided images. CT may provide better evaluation for fracture if clinically indicated.  The soft tissues are grossly unremarkable.  IMPRESSION: Severe osteopenia.  No definite acute fracture or dislocation.  Apparent cortical discontinuity along the medial aspect of the left femoral head is most likely artifactual and less likely represents a cortical fracture. Clinical correlation is recommended.   Electronically Signed   By: Anner Crete M.D.   On: 05/05/2015 03:51    ASSESSMENT AND PLAN:  Principal Problem:   Sepsis due to pneumonia Active Problems:   Hypothyroidism   DNR (do not  resuscitate)   CAD (coronary artery disease)   PAD (peripheral artery disease)   Alzheimer's disease   Elevated troponin I level   Acute respiratory failure with hypoxia   Bilateral pneumonia   HCAP (healthcare-associated pneumonia)   Hypoxic encephalopathy  New onset heart failure with decreaed EF on echo and likely MI at some point.  Agree with aspirin as well as continued diuresis for treatment of HF and MI.  Also agree with palliative care. Will Meredith Leeds, MD 05/19/2015 11:52 AM

## 2015-05-19 NOTE — Consult Note (Signed)
Consultation Note Date: 05/19/2015   Patient Name: Laura Benitez  DOB: [birth year]  MRN: [id number]  Age / Sex: 81 y.o., female   PCP: [name], MD Referring Physician: Albertine Patricia, MD  Reason for Consultation: Establishing goals of care  Palliative Care Assessment and Plan Summary of Established Goals of Care and Medical Treatment Preferences   Clinical Assessment/Narrative: Pt is a 81 yo female who was treated and dc'd on 05/09/15 for left femur fx with open reduction and internal fixation. She is now readmitted on 05/15/2015 with hypoxia, elevated troponin 1.32, lactate 4.14, tachycardia with leucocytosis. Her CXR shows bilateral infiltrates concerning for underlying aspiration. She was placed on BIPAP. Repeat ECHO now shows reduced EF of 35% as well as new MI since dc.. Pt has now weaned off BIPAP and is on a venti mask with sats 93%. She is alert, but very fatigued. Can nod her head yes and no, but minimally conversant. She does not know why she is in the hospital  Contacts/Participants in Discussion: Primary Decision Maker: Nephew HCPOA: yes  Chelsea Aus in [place] or [phone]  Code Status/Advance Care Planning:  DNR  Finish antibiotics; treat what can be treated without aggressive intervnetions  No further BIPAP  Return to facility with hospice support for EOL care  Goal is not to return to the hospital and for comfort and dignity  Symptom Management:   Dyspnea; Pt tolerating masks and shakes her head "yes" that her breathing feels better. Will add ms04 concentrate 2.mg -5mg  q2 prn as an aid to treat dyspnea as well as hopefully further transitioning her off face mask to [place]  Palliative Prophylaxis: bowel reimin 1815 Psycho-social/Spiritual:   Support System: Church and [place] as well as her caregiver, Romie Minus  Desire for further Chaplaincy support:no  Prognosis: < 6 months  Discharge Planning:  Back to facility with hospice in the  facility       Chief Complaint/History of Present Illness: Pt is a 81 yo female readmitted on 9/30 after just being dc'd on 9/24 s/p left hip fx repair, with hypoxia, tachycardia, elevated lactate level as well as troponin. CXR shows bilateral infiltrates  Primary Diagnoses  Present on Admission:  . DNR (do not resuscitate) . CAD (coronary artery disease) . PAD (peripheral artery disease) . Elevated troponin I level . (Resolved) Protein-calorie malnutrition, severe . Alzheimer's disease . Acute respiratory failure with hypoxia . Bilateral pneumonia . HCAP (healthcare-associated pneumonia) . Hypoxic encephalopathy . Sepsis due to pneumonia . Hypothyroidism  Palliative Review of Systems: Minimally conversant I have reviewed the medical record, interviewed the patient and family, and examined the patient. The following aspects are pertinent.  Past Medical History  Diagnosis Date  . Anemia   . Depression   . Anxiety   . Fracture of lumbar spine     History in 2000  . Allergy   . Migraine headache   . GERD (gastroesophageal reflux disease)   . UTI (urinary tract infection)   . Otalgia, unspecified 10/14/2012  . Stage I pressure ulcer of back 10/14/2012  . 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 Chronic fatigue syndrome 02/09/2012  . Abnormality of gait 01/30/2012  . Senile osteoporosis 12/25/2011  . Congestive heart failure, unspecified 12/10/2011  . Acute bronchitis 12/04/2011  . Closed fracture of lateral malleolus 11/27/2011  . Peripheral vascular disease, unspecified 10/20/2011  . Urinary frequency 09/25/2011  . Throat pain 08/13/2011  . Candidiasis of other urogenital sites 07/21/2011  . Unspecified hypothyroidism  07/21/2011  . Personality change due to conditions classified elsewhere 07/21/2011  . Cracked tooth 07/21/2011  . Candidiasis of mouth 07/16/2011  . Neoplasm of uncertain behavior of skin 07/16/2011  . Lumbago 07/16/2011  . Hyposmolality and/or hyponatremia 07/15/2011   . Acute posthemorrhagic anemia 07/15/2011  . Major depressive disorder, single episode, unspecified 07/15/2011  . Unspecified constipation 07/15/2011  . Other closed fractures of distal end of radius (alone) 07/15/2011  . Closed fracture of distal end of ulna (alone) 07/15/2011  . Other orthopedic aftercare(V54.89) 07/15/2011  . CAD (coronary artery disease) 01/11/2015  . PAD (peripheral artery disease) 01/11/2015  . Alzheimer's disease 05/03/2015  . Acute blood loss anemia   . Dysphagia, pharyngoesophageal phase 05/14/2015  . Fall    Social History   Social History  . Marital Status: Single    Spouse Name: N/A  . Number of Children: 0  . Years of Education: N/A   Occupational History  . retired Pharmacist, hospital    Social History Main Topics  . Smoking status: Never Smoker   . Smokeless tobacco: Never Used  . Alcohol Use: No  . Drug Use: No  . Sexual Activity: Not Currently   Other Topics Concern  . None   Social History Narrative   Lives at [place]   Family History  Problem Relation Age of Onset  . Lung cancer Father   . Colon cancer Neg Hx    Scheduled Meds: . aspirin  300 mg Rectal Daily  . cefTAZidime (FORTAZ)  IV  2 g Intravenous Q12H  . chlorhexidine  15 mL Mouth Rinse BID  . enoxaparin (LOVENOX) injection  30 mg Subcutaneous Q24H  . levothyroxine  25 mcg Intravenous Daily  . metoprolol  2.5 mg Intravenous Q12H  . vancomycin  500 mg Intravenous Q24H   Continuous Infusions:  PRN Meds:. Medications Prior to Admission:  Prior to Admission medications   Medication Sig Start Date End Date Taking? Authorizing Provider  alum & mag hydroxide-simeth (MINTOX) 200-200-20 MG/5ML suspension Take 30 mLs by mouth 3 (three) times daily. Take 30 ml three times daily as needed for indigestion.   Yes Historical Provider, MD  calcium-vitamin D (OSCAL WITH D) 500-200 MG-UNIT per tablet Take 1 tablet by mouth 2 (two) times daily.   Yes Historical Provider, MD  donepezil  (ARICEPT) 5 MG tablet One nightly to help preserve memory Patient taking differently: Take 5 mg by mouth at bedtime. One nightly to help preserve memory 05/03/15  Yes [name], MD  enoxaparin (LOVENOX) 30 MG/0.3ML injection Inject 0.3 mLs (30 mg total) into the skin daily. 05/09/15  Yes Leandrew Koyanagi, MD  fluticasone (FLONASE) 50 MCG/ACT nasal spray Place 2 sprays into the nose daily. Daily 12/23/12  Yes Historical Provider, MD  HYDROcodone-acetaminophen (NORCO) 7.5-325 MG per tablet Take 1-2 tablets by mouth every 6 (six) hours as needed for moderate pain. 05/09/15  Yes Naiping Ephriam Jenkins, MD  levothyroxine (SYNTHROID, LEVOTHROID) 50 MCG tablet Take 50 mcg by mouth daily. Take 1 tablet by mouth daily.   Yes Historical Provider, MD  loperamide (IMODIUM) 2 MG capsule Take 2-4 mg by mouth 3 (three) times daily as needed for diarrhea or loose stools.   Yes Historical Provider, MD  MYRBETRIQ 25 MG TB24 Take 25 mg by mouth daily. Take one tablet daily 12/22/12  Yes Historical Provider, MD  omeprazole (PRILOSEC) 40 MG capsule Take 40 mg by mouth daily.   Yes Historical Provider, MD  Polyethyl Glycol-Propyl Glycol (  SYSTANE) 0.4-0.3 % SOLN Apply 1 drop to eye every 4 (four) hours as needed (for eye discomfort). Both eyes as needed for comfort   Yes Historical Provider, MD  protein supplement (RESOURCE BENEPROTEIN) POWD Take 1 scoop by mouth daily. MIX 1 SCOOP IN 4 OZ. OF HONEY THICK H20 AND TAKE BY MOUTH AT 2 PM.   Yes Historical Provider, MD  psyllium (METAMUCIL) 58.6 % powder Take 1 packet by mouth daily as needed (for constipation). Take teaspoonful mixed in 8oz fluide daily as needed   Yes Historical Provider, MD  sertraline (ZOLOFT) 100 MG tablet One daily to help depression and anxiety Patient taking differently: Take 100 mg by mouth daily. One daily to help depression and anxiety 03/13/15  Yes [name], MD  simethicone (MYLICON) 627 MG chewable tablet Chew 125 mg by mouth 3 (three) times daily as needed  for flatulence.   Yes Historical Provider, MD  sodium chloride (DEEP SEA NASAL SPRAY) 0.65 % nasal spray Place 1 spray into the nose as needed for congestion. Use 1 spray in each nostril as needed   Yes Historical Provider, MD  vitamin A 10000 UNIT capsule Take 10,000 Units by mouth daily.   Yes Historical Provider, MD  Maltodextrin-Xanthan Gum ([place]) POWD Follow directions on package for nectar thick liquids Patient not taking: Reported on 05/01/2015 05/12/15   Allie Bossier, MD   Allergies  Allergen Reactions  . Milk-Related Compounds Other (See Comments)    Milk products cause patient to have migraine headaches.  Pt can tolerate butter.   CBC:    Component Value Date/Time   WBC 13.6* 05/19/2015 0105   WBC 13.5 05/15/2015   HGB 13.3 05/19/2015 0105   HCT 39.1 05/19/2015 0105   PLT 274 05/19/2015 0105   MCV 92.4 05/19/2015 0105   NEUTROABS 12.2* 05/19/2015 0105   LYMPHSABS 0.7 05/19/2015 0105   MONOABS 0.7 05/19/2015 0105   EOSABS 0.0 05/19/2015 0105   BASOSABS 0.0 05/19/2015 0105   Comprehensive Metabolic Panel:    Component Value Date/Time   NA 138 05/19/2015 0105   NA 136* 05/15/2015   K 2.8* 05/19/2015 0105   CL 94* 05/19/2015 0105   CO2 28 05/19/2015 0105   BUN 13 05/19/2015 0105   BUN 12 05/15/2015   CREATININE 0.69 05/19/2015 0105   CREATININE 0.4* 05/15/2015   GLUCOSE 125* 05/19/2015 0105   CALCIUM 8.0* 05/19/2015 0105   AST 33 05/19/2015 0105   ALT 16 05/19/2015 0105   ALKPHOS 108 05/19/2015 0105   BILITOT 2.1* 05/19/2015 0105   PROT 5.5* 05/19/2015 0105   ALBUMIN 2.8* 05/19/2015 0105    Physical Exam: Vital Signs: BP 98/60 mmHg  Pulse 89  Temp(Src) 97.9 F (36.6 C) (Axillary)  Resp 24  Ht 5\' 5"  (1.651 m)  Wt 40.4 kg (89 lb 1.1 oz)  BMI 14.82 kg/m2  SpO2 95% SpO2: SpO2: 95 % O2 Device: O2 Device: Venturi Mask O2 Flow Rate: O2 Flow Rate (L/min): 12 L/min Intake/output summary:  Intake/Output Summary (Last 24 hours) at 05/19/15  1743 Last data filed at 05/19/15 1317  Gross per 24 hour  Intake    450 ml  Output      0 ml  Net    450 ml   LBM:   Baseline Weight: Weight: 40.4 kg (89 lb 1.1 oz) Most recent weight: Weight: 40.4 kg (89 lb 1.1 oz)  Exam Findings:  General: Frail, pale elderly female. Very weak. Opens her eyes  to voice. Minimally conversant. Resp: Resp rate 24-26 on venti mask. Moist cough heard Cardiac: tachy         Palliative Performance Scale: 20-30%              Additional Data Reviewed: Recent Labs     05/14/2015  0959  05/19/15  0105  WBC  14.2*  13.6*  HGB  15.3*  13.3  PLT  322  274  NA  139  138  BUN  16  13  CREATININE  0.79  0.69     Time In: 1700 Time Out: 1815 Time Total: 75 min Greater than 50%  of this time was spent counseling and coordinating care related to the above assessment and plan.  Signed by: Dory Horn, NP  Dory Horn, NP  05/19/2015, 5:43 PM  Please contact Palliative Medicine Team phone at [phone] for questions and concerns.

## 2015-05-19 NOTE — Progress Notes (Signed)
Patient Demographics  Laura Benitez, is a 81 y.o. female, DOB - Mar 17, 1929, MPN:[id number]  Admit date - 05/10/2015   Admitting Physician Reyne Dumas, MD  Outpatient Primary MD for the patient is GREEN, Viviann Spare, MD  LOS - 1   Chief Complaint  Patient presents with  . Shortness of Breath       Admission HPI/Brief narrative: 81 year old female with recent history of left hip fracture and underwent open reduction and internal fixation of the left femur on 05/09/2015 by Dr. Erlinda Hong. During previous admission, Presented to the ER with significant hypoxia initial oxygen saturation 60%, CT scan significant for bilateral opacities,  started on broad-spectrum antibiotics for HCAP, as well found to have elevated troponin, echo findings significant for drop in EF, and LAD territory wall motion abnormality, finding consistent with non-STEMI.  Subjective:   Laura Benitez today has complaints of shortness of breath, denies any chest pain.  Assessment & Plan    Principal Problem:   Sepsis due to pneumonia Active Problems:   Hypothyroidism   DNR (do not resuscitate)   CAD (coronary artery disease)   PAD (peripheral artery disease)   Alzheimer's disease   Elevated troponin I level   Acute respiratory failure with hypoxia   Bilateral pneumonia   HCAP (healthcare-associated pneumonia)   Hypoxic encephalopathy  Acute  respiratory failure - This is secondary to HCAP, patient required BiPAP overnight, will try on nonrebreather this a.m.Marland Kitchen - Continue with nebs, pulmonary toilet, MTS as needed   sepsis secondary to HCAP - Presents with tachypnea, tachycardia and leukocytosis  -  continue with IV vancomycin and Fortaz  - Follow on blood cultures   Non-STEMI  - Patient presents with elevated troponin, echo finding with significant reduction in ED if, and LAD territory wall motion abnormality  - Cardiology does not  recommend any invasive workup ,  - Patient started on aspirin, and metoprolol.   hypokalemia  - Being repleted , will give magnesium as well , monitor closely .  Acute encephalopathy - Most likely metabolic due to infectious process, monitor clinically.  Code Status: DO NOT RESUSCITATE   Family Communication: none at bedside   Disposition Plan: remains in stepdown, awaiting palliative care consult.   Procedures  None   Consults   Cardiology   Medications  Scheduled Meds: . aspirin  300 mg Rectal Daily  . cefTAZidime (FORTAZ)  IV  2 g Intravenous Q12H  . chlorhexidine  15 mL Mouth Rinse BID  . enoxaparin (LOVENOX) injection  30 mg Subcutaneous Q24H  . levothyroxine  25 mcg Intravenous Daily  . magnesium sulfate 1 - 4 g bolus IVPB  2 g Intravenous Once  . potassium chloride  10 mEq Intravenous Q1 Hr x 6  . vancomycin  500 mg Intravenous Q24H   Continuous Infusions:  PRN Meds:.  DVT Prophylaxis  Lovenox -  Lab Results  Component Value Date   PLT 274 05/19/2015    Antibiotics    Anti-infectives    Start     Dose/Rate Route Frequency Ordered Stop   05/19/15 1130  vancomycin (VANCOCIN) 500 mg in sodium chloride 0.9 % 100 mL IVPB     500 mg 100 mL/hr over 60 Minutes Intravenous Every 24 hours 05/01/2015  1631     05/12/2015 2030  cefTAZidime (FORTAZ) 2 g in dextrose 5 % 50 mL IVPB     2 g 100 mL/hr over 30 Minutes Intravenous Every 12 hours 04/29/2015 1632     05/17/2015 1115  vancomycin (VANCOCIN) IVPB 1000 mg/200 mL premix     1,000 mg 200 mL/hr over 60 Minutes Intravenous  Once 04/22/2015 1105 05/05/2015 1228   04/21/2015 1100  vancomycin (VANCOCIN) 1,500 mg in sodium chloride 0.9 % 500 mL IVPB  Status:  Discontinued     1,500 mg 250 mL/hr over 120 Minutes Intravenous  Once 05/07/2015 1048 05/16/2015 1058   05/04/2015 1100  piperacillin-tazobactam (ZOSYN) IVPB 2.25 g  Status:  Discontinued     2.25 g 100 mL/hr over 30 Minutes Intravenous  Once 05/04/2015 1048 05/16/2015 1057    04/29/2015 1100  piperacillin-tazobactam (ZOSYN) IVPB 3.375 g     3.375 g 12.5 mL/hr over 240 Minutes Intravenous  Once 05/11/2015 1058 05/02/2015 1719   04/19/2015 1100  vancomycin (VANCOCIN) 1,500 mg in sodium chloride 0.9 % 500 mL IVPB  Status:  Discontinued     1,500 mg 250 mL/hr over 120 Minutes Intravenous  Once 05/07/2015 1058 05/15/2015 1105          Objective:   Filed Vitals:   05/03/2015 2349 05/19/15 0316 05/19/15 0411 05/19/15 0803  BP:   99/52 110/70  Pulse: 88 83 85 91  Temp:   98.3 F (36.8 C) 97.5 F (36.4 C)  TempSrc:   Axillary Oral  Resp: 22 23 22 20   Height:      Weight:      SpO2: 97% 99% 99% 97%    Wt Readings from Last 3 Encounters:  04/28/2015 40.4 kg (89 lb 1.1 oz)  05/14/15 43.092 kg (95 lb)  05/12/15 39.2 kg (86 lb 6.7 oz)     Intake/Output Summary (Last 24 hours) at 05/19/15 1022 Last data filed at 04/27/2015 1232  Gross per 24 hour  Intake   1200 ml  Output      0 ml  Net   1200 ml     Physical Exam  Awake , frail, confused, in respiratory distress , dry oral mucosa Supple Neck Symmetrical Chest wall movement, Decreased air entry bilaterally, use of accessory muscle  S1,S2 +, no rubs or gallops. +ve B.Sounds, Abd Soft, No tenderness,  No Cyanosis, Clubbing or edema,   Data Review   Micro Results No results found for this or any previous visit (from the past 240 hour(s)).  Radiology Reports Ct Head Wo Contrast  05/05/2015   CLINICAL DATA:  81 year old female with fall  EXAM: CT HEAD WITHOUT CONTRAST  CT CERVICAL SPINE WITHOUT CONTRAST  TECHNIQUE: Multidetector CT imaging of the head and cervical spine was performed following the standard protocol without intravenous contrast. Multiplanar CT image reconstructions of the cervical spine were also generated.  COMPARISON:  None.  FINDINGS: CT HEAD FINDINGS  There is mild dilatation of the ventricles and sulci compatible with age-related atrophy. Periventricular and deep white matter hypodensities  represent chronic microvascular ischemic changes. Left frontal old infarct and encephalomalacia. There is a small focal area of encephalomalacia changes in the anterior left temporal lobe, likely related to an old insult. There is no intracranial hemorrhage. No mass effect or midline shift identified.  The visualized paranasal sinuses and mastoid air cells are well aerated. A small focal lucency noted in the occiput. The calvarium is intact.  CT CERVICAL SPINE FINDINGS  There is  no acute fracture or subluxation of the cervical spine.There is advanced osteopenia. Multilevel degenerative changes.The odontoid and spinous processes are intact.There is normal anatomic alignment of the C1-C2 lateral masses. The visualized soft tissues appear unremarkable.  IMPRESSION: No acute intracranial pathology.  Age-related atrophy and chronic microvascular ischemic disease. 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  Advanced osteopenia.  No acute/traumatic cervical spine pathology.   Electronically Signed   By: Anner Crete M.D.   On: 05/05/2015 03:58   Ct Angio Chest Pe W/cm &/or Wo Cm  04/24/2015   CLINICAL DATA:  Shortness of breath, low O2 sats  EXAM: CT ANGIOGRAPHY CHEST WITH CONTRAST  TECHNIQUE: Multidetector CT imaging of the chest was performed using the standard protocol during bolus administration of intravenous contrast. Multiplanar CT image reconstructions and MIPs were obtained to evaluate the vascular anatomy.  CONTRAST:  18mL OMNIPAQUE IOHEXOL 350 MG/ML SOLN  COMPARISON:  None.  FINDINGS: There is adequate opacification of the pulmonary arteries. There is no pulmonary embolus. The main pulmonary artery, right main pulmonary artery and left main pulmonary arteries are normal in size. The heart size is enlarged. There is no pericardial effusion.  There is a moderate right and small left pleural effusion with bibasilar atelectasis. There is no pneumothorax. There are patchy ground-glass opacities  bilaterally.  There is no axillary, hilar, or mediastinal adenopathy.  There is no lytic or blastic osseous lesion.  There is a severe chronic L1 vertebral body compression fracture. There is a severe age-indeterminate T8 vertebral body compression fracture. There are mild age-indeterminate T9, T10, T11 and T12 vertebral body compression fractures.  Review of the MIP images confirms the above findings.  IMPRESSION: 1. No evidence of pulmonary embolus. 2. No evidence of thoracic aortic dissection. 3. Findings most concerning for CHF.   Electronically Signed   By: Kathreen Devoid   On: 05/14/2015 11:39   Ct Cervical Spine Wo Contrast  05/05/2015   CLINICAL DATA:  82 year old female with fall  EXAM: CT HEAD WITHOUT CONTRAST  CT CERVICAL SPINE WITHOUT CONTRAST  TECHNIQUE: Multidetector CT imaging of the head and cervical spine was performed following the standard protocol without intravenous contrast. Multiplanar CT image reconstructions of the cervical spine were also generated.  COMPARISON:  None.  FINDINGS: CT HEAD FINDINGS  There is mild dilatation of the ventricles and sulci compatible with age-related atrophy. Periventricular and deep white matter hypodensities represent chronic microvascular ischemic changes. Left frontal old infarct and encephalomalacia. There is a small focal area of encephalomalacia changes in the anterior left temporal lobe, likely related to an old insult. There is no intracranial hemorrhage. No mass effect or midline shift identified.  The visualized paranasal sinuses and mastoid air cells are well aerated. A small focal lucency noted in the occiput. The calvarium is intact.  CT CERVICAL SPINE FINDINGS  There is no acute fracture or subluxation of the cervical spine.There is advanced osteopenia. Multilevel degenerative changes.The odontoid and spinous processes are intact.There is normal anatomic alignment of the C1-C2 lateral masses. The visualized soft tissues appear unremarkable.   IMPRESSION: No acute intracranial pathology.  Age-related atrophy and chronic microvascular ischemic disease. 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  Advanced osteopenia.  No acute/traumatic cervical spine pathology.   Electronically Signed   By: Anner Crete M.D.   On: 05/05/2015 03:58   Ct Pelvis Wo Contrast  05/05/2015   CLINICAL DATA:  Left hip pain after fall. Concern for left hip fracture.  EXAM: CT PELVIS WITHOUT  CONTRAST  TECHNIQUE: Multidetector CT imaging of the pelvis was performed following the standard protocol without intravenous contrast.  COMPARISON:  Radiographs earlier this day.  FINDINGS: There is a minimally comminuted and minimally displaced fracture of the intertrochanteric left femur primarily involving the greater trochanter. No extension of the femoral neck. The femoral head is intact. Severe osteoporosis. Right hip arthroplasty in place. Remote fracture of the right and left inferior pubic rami. Bony pelvis is otherwise intact. Urinary bladder is significantly distended.  IMPRESSION: Minimally comminuted and displaced intertrochanteric left hip fracture. The femoral head is intact.   Electronically Signed   By: Jeb Levering M.D.   On: 05/05/2015 06:25   Dg Chest Port 1 View  05/19/2015   CLINICAL DATA:  Healthcare associated pneumonia  EXAM: PORTABLE CHEST 1 VIEW  COMPARISON:  Yesterday  FINDINGS: Bibasilar airspace disease with bronchograms. Superimposed small to moderate pleural effusions. Stable heart size and mediastinal contours when allowing for rotation.  IMPRESSION: Bibasilar pneumonia with small to moderate pleural effusion, similar to yesterday. Pattern and extensive airway filling on yesterday CT raises concern for underlying aspiration.   Electronically Signed   By: Monte Fantasia M.D.   On: 05/19/2015 06:13   Dg Chest Port 1 View  05/11/2015   CLINICAL DATA:  Hypoxia/respiratory failure  EXAM: PORTABLE CHEST 1 VIEW  COMPARISON:  Chest CT May 18, 2015; chest radiograph July 10, 2011  FINDINGS: There are bilateral pleural effusions with relatively mild interstitial edema. Heart is mildly enlarged. There is mild pulmonary venous hypertension. There is patchy bibasilar atelectasis.  Bones are osteoporotic. There is increase in kyphosis. No adenopathy apparent.  IMPRESSION: Findings consistent with congestive heart failure. There is no apparent change compared to CT obtained earlier in the day.   Electronically Signed   By: Lowella Grip III M.D.   On: 05/05/2015 19:52   Dg Hip Operative Unilat With Pelvis Left  05/09/2015   CLINICAL DATA:  Left hip fracture.  EXAM: OPERATIVE left HIP (WITH PELVIS IF PERFORMED) 2 VIEWS  TECHNIQUE: Fluoroscopic spot image(s) were submitted for interpretation post-operatively.  COMPARISON:  Radiographs and CT scan dated 05/05/2015  FINDINGS: The patient has undergone open reduction and internal fixation of the proximal left femur fracture. Two screws and and intramedullary nail have been inserted and appear in excellent position. Alignment and position of the fracture fragments is anatomic.  IMPRESSION: Open reduction and internal fixation of left femur fracture.   Electronically Signed   By: Lorriane Shire M.D.   On: 05/09/2015 17:36   Dg Hip Unilat With Pelvis 2-3 Views Left  05/05/2015   CLINICAL DATA:  81 year old female with fall and left hip pain.  EXAM: DG HIP (WITH OR WITHOUT PELVIS) 2-3V LEFT  COMPARISON:  None.  FINDINGS: There is severe osteopenia which limits evaluation of the study for fracture. There is a right total hip arthroplasty. There is a focal area of cortical discontinue the in the medial portion of the left femoral head, likely artifactual. A cortical fracture is less likely. Clinical correlation is recommended. No definite fracture identified. There is no dislocation on the provided images. CT may provide better evaluation for fracture if clinically indicated.  The soft tissues are  grossly unremarkable.  IMPRESSION: Severe osteopenia.  No definite acute fracture or dislocation.  Apparent cortical discontinuity along the medial aspect of the left femoral head is most likely artifactual and less likely represents a cortical fracture. Clinical correlation is recommended.   Electronically Signed  By: Anner Crete M.D.   On: 05/05/2015 03:51     CBC  Recent Labs Lab 05/15/15 04/29/2015 0959 05/19/15 0105  WBC 13.5 14.2* 13.6*  HGB 13.8 15.3* 13.3  HCT 39 45.5 39.1  PLT 210 322 274  MCV  --  93.8 92.4  MCH  --  31.5 31.4  MCHC  --  33.6 34.0  RDW  --  15.7* 15.7*  LYMPHSABS  --  0.6* 0.7  MONOABS  --  0.6 0.7  EOSABS  --  0.0 0.0  BASOSABS  --  0.0 0.0    Chemistries   Recent Labs Lab 05/15/15 05/08/2015 0959 05/19/15 0105  NA 136* 139 138  K 3.7 4.3 2.8*  CL  --  97* 94*  CO2  --  23 28  GLUCOSE  --  137* 125*  BUN 12 16 13   CREATININE 0.4* 0.79 0.69  CALCIUM  --  8.9 8.0*  AST 22  --  33  ALT 10  --  16  ALKPHOS 89  --  108  BILITOT  --   --  2.1*   ------------------------------------------------------------------------------------------------------------------ estimated creatinine clearance is 33.4 mL/min (by C-G formula based on Cr of 0.69). ------------------------------------------------------------------------------------------------------------------ No results for input(s): HGBA1C in the last 72 hours. ------------------------------------------------------------------------------------------------------------------ No results for input(s): CHOL, HDL, LDLCALC, TRIG, CHOLHDL, LDLDIRECT in the last 72 hours. ------------------------------------------------------------------------------------------------------------------ No results for input(s): TSH, T4TOTAL, T3FREE, THYROIDAB in the last 72 hours.  Invalid input(s): FREET3 ------------------------------------------------------------------------------------------------------------------ No  results for input(s): VITAMINB12, FOLATE, FERRITIN, TIBC, IRON, RETICCTPCT in the last 72 hours.  Coagulation profile No results for input(s): INR, PROTIME in the last 168 hours.  No results for input(s): DDIMER in the last 72 hours.  Cardiac Enzymes  Recent Labs Lab 05/13/2015 1316 04/28/2015 1905 05/19/15 0105  TROPONINI 1.43* 1.23* 0.96*   ------------------------------------------------------------------------------------------------------------------ Invalid input(s): POCBNP     Time Spent in minutes   40 minutes   ELGERGAWY, DAWOOD M.D on 05/19/2015 at 10:22 AM  Between 7am to 7pm - Pager - [phone]  After 7pm go to [email or web address] - password [facility]  [facility]   Office  [phone]

## 2015-05-19 NOTE — Progress Notes (Signed)
Initial Nutrition Assessment  DOCUMENTATION CODES:   Severe malnutrition in context of chronic illness, Underweight  INTERVENTION:   -RD will make further recommendations based upon GOC  NUTRITION DIAGNOSIS:   Malnutrition related to chronic illness as evidenced by severe depletion of body fat, severe depletion of muscle mass.  GOAL:   Patient will meet greater than or equal to 90% of their needs  MONITOR:   Other (Comment), PO intake, Diet advancement, Labs, Skin, Weight trends, I & O's (GOC)  REASON FOR ASSESSMENT:   Malnutrition Screening Tool    ASSESSMENT:   Laura Benitez is a 81 y.o. female who presents to ED with shortness of breath and tachycardia. Oxygen sats in 60's per EMS report. Patient has a history of alzheimer's. No family members present. She is s/p hip surgery approximately two weeks ago, on outpatient Lovenox.  Pt admitted with sepsis due to HCAP pneumonia.   Pt lying in fed in fetal position at time of visit. She is on a non-rebreather mask and unable to communicate with this RD. No family present.   Pt is NPO.   Nutrition-Focused physical exam completed. Findings are severe fat depletion, severe muscle depletion, and no edema.   Per MD notes, pt unlikely to survive this hospitalization. Pt is awaiting palliative consult for goals of care.   Labs reviewed: K: 2.8.  Diet Order:  Diet NPO time specified Except for: Sips with Meds  Skin:  Wound (see comment) (stage II buttocks, stage III back)  Last BM:  PTA  Height:   Ht Readings from Last 1 Encounters:  04/29/2015 5\' 5"  (1.651 m)    Weight:   Wt Readings from Last 1 Encounters:  04/27/2015 89 lb 1.1 oz (40.4 kg)    Ideal Body Weight:  56.8 kg  BMI:  Body mass index is 14.82 kg/(m^2).  Estimated Nutritional Needs:   Kcal:  1200-1400  Protein:  50-60 grams  Fluid:  >1.2 L  EDUCATION NEEDS:   No education needs identified at this time  Laura Benitez A. Jimmye Norman, RD, LDN, CDE Pager:  [phone] After hours Pager: [phone]

## 2015-05-19 NOTE — Procedures (Signed)
Pt is resting well and comfortable on VM 12L & 50% oxygen.  Pt's HR-77, RR-19, SpO2-95.  BiPAP is not needed at this time and will not be placed.

## 2015-05-19 DEATH — deceased

## 2015-05-20 DIAGNOSIS — Z515 Encounter for palliative care: Secondary | ICD-10-CM | POA: Insufficient documentation

## 2015-05-20 DIAGNOSIS — Z789 Other specified health status: Secondary | ICD-10-CM

## 2015-05-20 MED ORDER — LORAZEPAM 2 MG/ML IJ SOLN
1.0000 mg | Freq: Once | INTRAMUSCULAR | Status: AC
  Administered 2015-05-20: 1 mg via INTRAVENOUS

## 2015-05-20 MED ORDER — MORPHINE SULFATE (PF) 2 MG/ML IV SOLN
1.0000 mg | Freq: Four times a day (QID) | INTRAVENOUS | Status: DC
  Administered 2015-05-20 – 2015-05-23 (×13): 1 mg via INTRAVENOUS
  Filled 2015-05-20 (×12): qty 1

## 2015-05-20 MED ORDER — LORAZEPAM 2 MG/ML IJ SOLN: 1.0000 mg | INTRAMUSCULAR | Status: DC | PRN

## 2015-05-20 MED ORDER — MORPHINE SULFATE (PF) 2 MG/ML IV SOLN
1.0000 mg | Freq: Once | INTRAVENOUS | Status: AC
  Administered 2015-05-20: 1 mg via INTRAVENOUS

## 2015-05-20 MED ORDER — LORAZEPAM 2 MG/ML IJ SOLN
INTRAMUSCULAR | Status: AC
  Filled 2015-05-20: qty 1

## 2015-05-20 MED ORDER — MORPHINE SULFATE (PF) 2 MG/ML IV SOLN: 1.0000 mg | INTRAVENOUS | Status: DC | PRN

## 2015-05-20 MED ORDER — MORPHINE SULFATE (PF) 2 MG/ML IV SOLN
1.0000 mg | INTRAVENOUS | Status: DC | PRN
  Administered 2015-05-20 – 2015-05-22 (×3): 2 mg via INTRAVENOUS
  Filled 2015-05-20 (×4): qty 1

## 2015-05-20 MED ORDER — SODIUM CHLORIDE 0.9 % IV SOLN
500.0000 mg | Freq: Two times a day (BID) | INTRAVENOUS | Status: DC
  Administered 2015-05-20 – 2015-05-23 (×6): 500 mg via INTRAVENOUS
  Filled 2015-05-20 (×8): qty 5

## 2015-05-20 MED ORDER — LEVETIRACETAM 500 MG/5ML IV SOLN
1500.0000 mg | Freq: Once | INTRAVENOUS | Status: AC
  Administered 2015-05-20: 1500 mg via INTRAVENOUS
  Filled 2015-05-20: qty 15

## 2015-05-20 MED ORDER — MORPHINE SULFATE (PF) 2 MG/ML IV SOLN
INTRAVENOUS | Status: AC
  Filled 2015-05-20: qty 1

## 2015-05-20 MED ORDER — LORAZEPAM 2 MG/ML IJ SOLN
1.0000 mg | INTRAMUSCULAR | Status: DC | PRN
  Administered 2015-05-21 – 2015-05-22 (×2): 1 mg via INTRAVENOUS
  Filled 2015-05-20 (×2): qty 1

## 2015-05-20 NOTE — Progress Notes (Signed)
LCSW aware of consult that patient is from SNF: [facility]. Patient is medically acute at this time and chaplain has been working with family. LCSW following along with disposition of patient with potential DC back to SNF with hospice care. Will hold assessment at this time, as patient had seizures this morning and unstable.  Will follow and assist with needs.  Unit CSW to follow up on progress of patient in AM. Will completed full assessment once appropriate.   Lane Hacker, MSW Clinical Social Work: Emergency Room [phone]

## 2015-05-20 NOTE — Progress Notes (Addendum)
Patient Demographics  Laura Benitez, is a 81 y.o. female, DOB - 23-Mar-1929, NFA:[id number]  Admit date - 05/04/2015   Admitting Physician Reyne Dumas, MD  Outpatient Primary MD for the patient is GREEN, Viviann Spare, MD  LOS - 2   Chief Complaint  Patient presents with  . Shortness of Breath       Admission HPI/Brief narrative: 81 year old female with recent history of left hip fracture and underwent open reduction and internal fixation of the left femur on 05/09/2015 by Dr. Erlinda Hong. During previous admission, Presented to the ER with significant hypoxia initial oxygen saturation 60%, CT scan significant for bilateral opacities,  started on broad-spectrum antibiotics for HCAP, as well found to have elevated troponin, echo findings significant for drop in EF, and LAD territory wall motion abnormality, finding consistent with non-STEMI.  Subjective:   Laura Benitez today is unresponsive. She had a few mild seizures overnight.  Assessment & Plan    Principal Problem:   Sepsis due to pneumonia [facility]) Active Problems:   Hypothyroidism   DNR (do not resuscitate)   CAD (coronary artery disease)   PAD (peripheral artery disease) ([place])   Alzheimer's disease   Elevated troponin I level   Acute respiratory failure with hypoxia ([place])   Bilateral pneumonia   HCAP (healthcare-associated pneumonia)   Hypoxic encephalopathy ([place])   Palliative care encounter  Acute  respiratory failure - This is secondary to HCAP, no further BiPAP as per family discussion with palliative care yesterday - Unlikely to recover, discussed with sister   sepsis secondary to HCAP - Presents with tachypnea, tachycardia and leukocytosis   Non-STEMI  - Patient presents with elevated troponin, echo finding with significant reduction in EF and LAD territory wall motion abnormality . - Cardiology does not recommend any invasive workup ,  -  Patient started on aspirin, and metoprolol.   hypokalemia  - Repleted.  Acute encephalopathy - Metabolic secondary to infectious process, and respiratory failure.  Seizures - Patient had 3 episodes of brief seizures overnight, and this point not a candidate for any invasive workup, as she is actively dying, we'll manage with ER and Ativan as needed. On IV Keppra.  Goals of care : - Discussed with sister this a.m., informed her patient is actively dying, chaplain spoke to sister as well, at this point patient is on when necessary morphine for dyspnea, when necessary Ativan for seizures.  Code Status: DO NOT RESUSCITATE   Family Communication: spoke with sister  Disposition Plan: Anticipate death during hospital stay   Procedures  None   Consults   Cardiology   Medications  Scheduled Meds: . levETIRAcetam  500 mg Intravenous Q12H  . morphine       Continuous Infusions:  PRN Meds:.  DVT Prophylaxis  Lovenox -  Lab Results  Component Value Date   PLT 274 05/19/2015    Antibiotics    Anti-infectives    Start     Dose/Rate Route Frequency Ordered Stop   05/19/15 1130  vancomycin (VANCOCIN) 500 mg in sodium chloride 0.9 % 100 mL IVPB  Status:  Discontinued     500 mg 100 mL/hr over 60 Minutes Intravenous Every 24 hours 05/04/2015 1631 05/20/15 0925   05/17/2015 2030  cefTAZidime (FORTAZ)  2 g in dextrose 5 % 50 mL IVPB  Status:  Discontinued     2 g 100 mL/hr over 30 Minutes Intravenous Every 12 hours 04/25/2015 1632 05/20/15 0924   04/27/2015 1115  vancomycin (VANCOCIN) IVPB 1000 mg/200 mL premix     1,000 mg 200 mL/hr over 60 Minutes Intravenous  Once 05/05/2015 1105 05/03/2015 1228   04/21/2015 1100  vancomycin (VANCOCIN) 1,500 mg in sodium chloride 0.9 % 500 mL IVPB  Status:  Discontinued     1,500 mg 250 mL/hr over 120 Minutes Intravenous  Once 05/11/2015 1048 04/20/2015 1058   05/11/2015 1100  piperacillin-tazobactam (ZOSYN) IVPB 2.25 g  Status:  Discontinued     2.25 g 100  mL/hr over 30 Minutes Intravenous  Once 05/14/2015 1048 04/28/2015 1057   05/17/2015 1100  piperacillin-tazobactam (ZOSYN) IVPB 3.375 g     3.375 g 12.5 mL/hr over 240 Minutes Intravenous  Once 05/12/2015 1058 04/20/2015 1719   05/07/2015 1100  vancomycin (VANCOCIN) 1,500 mg in sodium chloride 0.9 % 500 mL IVPB  Status:  Discontinued     1,500 mg 250 mL/hr over 120 Minutes Intravenous  Once 04/19/2015 1058 05/17/2015 1105          Objective:   Filed Vitals:   05/19/15 1951 05/19/15 2324 05/20/15 0414 05/20/15 0701  BP: 100/54 107/65 115/71 124/73  Pulse: 80 78 83 127  Temp: 97.9 F (36.6 C) 98.2 F (36.8 C) 97.9 F (36.6 C)   TempSrc: Axillary Axillary Axillary   Resp: 28 24 27 23   Height:      Weight:      SpO2: 94% 86% 91% 84%    Wt Readings from Last 3 Encounters:  05/11/2015 40.4 kg (89 lb 1.1 oz)  05/14/15 43.092 kg (95 lb)  05/12/15 39.2 kg (86 lb 6.7 oz)     Intake/Output Summary (Last 24 hours) at 05/20/15 1043 Last data filed at 05/19/15 2108  Gross per 24 hour  Intake    450 ml  Output      0 ml  Net    450 ml     Physical Exam  Frail, ill appearing,unresponsive. dry oral mucosa Supple Neck  Decreased air entry bilaterally,shallow breathing S1,S2 +, no rubs or gallops. +ve B.Sounds, Abd Soft, No tenderness,  No Clubbing or edema, cool extremities.  Data Review   Micro Results Recent Results (from the past 240 hour(s))  Blood culture (routine x 2)     Status: None (Preliminary result)   Collection Time: 04/19/2015  9:59 AM  Result Value Ref Range Status   Specimen Description BLOOD RIGHT ARM  Final   Special Requests BOTTLES DRAWN AEROBIC ONLY 2CC  Final   Culture NO GROWTH 1 DAY  Final   Report Status PENDING  Incomplete  Blood culture (routine x 2)     Status: None (Preliminary result)   Collection Time: 04/19/2015 11:13 AM  Result Value Ref Range Status   Specimen Description BLOOD RIGHT ANTECUBITAL  Final   Special Requests BOTTLES DRAWN AEROBIC AND  ANAEROBIC 5CC  Final   Culture NO GROWTH 1 DAY  Final   Report Status PENDING  Incomplete    Radiology Reports Ct Head Wo Contrast  05/05/2015   CLINICAL DATA:  81 year old female with fall  EXAM: CT HEAD WITHOUT CONTRAST  CT CERVICAL SPINE WITHOUT CONTRAST  TECHNIQUE: Multidetector CT imaging of the head and cervical spine was performed following the standard protocol without intravenous contrast. Multiplanar CT image reconstructions of the  cervical spine were also generated.  COMPARISON:  None.  FINDINGS: CT HEAD FINDINGS  There is mild dilatation of the ventricles and sulci compatible with age-related atrophy. Periventricular and deep white matter hypodensities represent chronic microvascular ischemic changes. Left frontal old infarct and encephalomalacia. There is a small focal area of encephalomalacia changes in the anterior left temporal lobe, likely related to an old insult. There is no intracranial hemorrhage. No mass effect or midline shift identified.  The visualized paranasal sinuses and mastoid air cells are well aerated. A small focal lucency noted in the occiput. The calvarium is intact.  CT CERVICAL SPINE FINDINGS  There is no acute fracture or subluxation of the cervical spine.There is advanced osteopenia. Multilevel degenerative changes.The odontoid and spinous processes are intact.There is normal anatomic alignment of the C1-C2 lateral masses. The visualized soft tissues appear unremarkable.  IMPRESSION: No acute intracranial pathology.  Age-related atrophy and chronic microvascular ischemic disease. 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  Advanced osteopenia.  No acute/traumatic cervical spine pathology.   Electronically Signed   By: Anner Crete M.D.   On: 05/05/2015 03:58   Ct Angio Chest Pe W/cm &/or Wo Cm  05/09/2015   CLINICAL DATA:  Shortness of breath, low O2 sats  EXAM: CT ANGIOGRAPHY CHEST WITH CONTRAST  TECHNIQUE: Multidetector CT imaging of the chest was  performed using the standard protocol during bolus administration of intravenous contrast. Multiplanar CT image reconstructions and MIPs were obtained to evaluate the vascular anatomy.  CONTRAST:  97mL OMNIPAQUE IOHEXOL 350 MG/ML SOLN  COMPARISON:  None.  FINDINGS: There is adequate opacification of the pulmonary arteries. There is no pulmonary embolus. The main pulmonary artery, right main pulmonary artery and left main pulmonary arteries are normal in size. The heart size is enlarged. There is no pericardial effusion.  There is a moderate right and small left pleural effusion with bibasilar atelectasis. There is no pneumothorax. There are patchy ground-glass opacities bilaterally.  There is no axillary, hilar, or mediastinal adenopathy.  There is no lytic or blastic osseous lesion.  There is a severe chronic L1 vertebral body compression fracture. There is a severe age-indeterminate T8 vertebral body compression fracture. There are mild age-indeterminate T9, T10, T11 and T12 vertebral body compression fractures.  Review of the MIP images confirms the above findings.  IMPRESSION: 1. No evidence of pulmonary embolus. 2. No evidence of thoracic aortic dissection. 3. Findings most concerning for CHF.   Electronically Signed   By: Kathreen Devoid   On: 04/30/2015 11:39   Ct Cervical Spine Wo Contrast  05/05/2015   CLINICAL DATA:  81 year old female with fall  EXAM: CT HEAD WITHOUT CONTRAST  CT CERVICAL SPINE WITHOUT CONTRAST  TECHNIQUE: Multidetector CT imaging of the head and cervical spine was performed following the standard protocol without intravenous contrast. Multiplanar CT image reconstructions of the cervical spine were also generated.  COMPARISON:  None.  FINDINGS: CT HEAD FINDINGS  There is mild dilatation of the ventricles and sulci compatible with age-related atrophy. Periventricular and deep white matter hypodensities represent chronic microvascular ischemic changes. Left frontal old infarct and  encephalomalacia. There is a small focal area of encephalomalacia changes in the anterior left temporal lobe, likely related to an old insult. There is no intracranial hemorrhage. No mass effect or midline shift identified.  The visualized paranasal sinuses and mastoid air cells are well aerated. A small focal lucency noted in the occiput. The calvarium is intact.  CT CERVICAL SPINE FINDINGS  There is  no acute fracture or subluxation of the cervical spine.There is advanced osteopenia. Multilevel degenerative changes.The odontoid and spinous processes are intact.There is normal anatomic alignment of the C1-C2 lateral masses. The visualized soft tissues appear unremarkable.  IMPRESSION: No acute intracranial pathology.  Age-related atrophy and chronic microvascular ischemic disease. 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  Advanced osteopenia.  No acute/traumatic cervical spine pathology.   Electronically Signed   By: Anner Crete M.D.   On: 05/05/2015 03:58   Ct Pelvis Wo Contrast  05/05/2015   CLINICAL DATA:  Left hip pain after fall. Concern for left hip fracture.  EXAM: CT PELVIS WITHOUT CONTRAST  TECHNIQUE: Multidetector CT imaging of the pelvis was performed following the standard protocol without intravenous contrast.  COMPARISON:  Radiographs earlier this day.  FINDINGS: There is a minimally comminuted and minimally displaced fracture of the intertrochanteric left femur primarily involving the greater trochanter. No extension of the femoral neck. The femoral head is intact. Severe osteoporosis. Right hip arthroplasty in place. Remote fracture of the right and left inferior pubic rami. Bony pelvis is otherwise intact. Urinary bladder is significantly distended.  IMPRESSION: Minimally comminuted and displaced intertrochanteric left hip fracture. The femoral head is intact.   Electronically Signed   By: Jeb Levering M.D.   On: 05/05/2015 06:25   Dg Chest Port 1 View  05/19/2015    CLINICAL DATA:  Healthcare associated pneumonia  EXAM: PORTABLE CHEST 1 VIEW  COMPARISON:  Yesterday  FINDINGS: Bibasilar airspace disease with bronchograms. Superimposed small to moderate pleural effusions. Stable heart size and mediastinal contours when allowing for rotation.  IMPRESSION: Bibasilar pneumonia with small to moderate pleural effusion, similar to yesterday. Pattern and extensive airway filling on yesterday CT raises concern for underlying aspiration.   Electronically Signed   By: Monte Fantasia M.D.   On: 05/19/2015 06:13   Dg Chest Port 1 View  04/30/2015   CLINICAL DATA:  Hypoxia/respiratory failure  EXAM: PORTABLE CHEST 1 VIEW  COMPARISON:  Chest CT May 18, 2015; chest radiograph July 10, 2011  FINDINGS: There are bilateral pleural effusions with relatively mild interstitial edema. Heart is mildly enlarged. There is mild pulmonary venous hypertension. There is patchy bibasilar atelectasis.  Bones are osteoporotic. There is increase in kyphosis. No adenopathy apparent.  IMPRESSION: Findings consistent with congestive heart failure. There is no apparent change compared to CT obtained earlier in the day.   Electronically Signed   By: Lowella Grip III M.D.   On: 05/16/2015 19:52   Dg Hip Operative Unilat With Pelvis Left  05/09/2015   CLINICAL DATA:  Left hip fracture.  EXAM: OPERATIVE left HIP (WITH PELVIS IF PERFORMED) 2 VIEWS  TECHNIQUE: Fluoroscopic spot image(s) were submitted for interpretation post-operatively.  COMPARISON:  Radiographs and CT scan dated 05/05/2015  FINDINGS: The patient has undergone open reduction and internal fixation of the proximal left femur fracture. Two screws and and intramedullary nail have been inserted and appear in excellent position. Alignment and position of the fracture fragments is anatomic.  IMPRESSION: Open reduction and internal fixation of left femur fracture.   Electronically Signed   By: Lorriane Shire M.D.   On: 05/09/2015 17:36    Dg Hip Unilat With Pelvis 2-3 Views Left  05/05/2015   CLINICAL DATA:  81 year old female with fall and left hip pain.  EXAM: DG HIP (WITH OR WITHOUT PELVIS) 2-3V LEFT  COMPARISON:  None.  FINDINGS: There is severe osteopenia which limits evaluation of the study for fracture.  There is a right total hip arthroplasty. There is a focal area of cortical discontinue the in the medial portion of the left femoral head, likely artifactual. A cortical fracture is less likely. Clinical correlation is recommended. No definite fracture identified. There is no dislocation on the provided images. CT may provide better evaluation for fracture if clinically indicated.  The soft tissues are grossly unremarkable.  IMPRESSION: Severe osteopenia.  No definite acute fracture or dislocation.  Apparent cortical discontinuity along the medial aspect of the left femoral head is most likely artifactual and less likely represents a cortical fracture. Clinical correlation is recommended.   Electronically Signed   By: Anner Crete M.D.   On: 05/05/2015 03:51     CBC  Recent Labs Lab 05/15/15 04/22/2015 0959 05/19/15 0105  WBC 13.5 14.2* 13.6*  HGB 13.8 15.3* 13.3  HCT 39 45.5 39.1  PLT 210 322 274  MCV  --  93.8 92.4  MCH  --  31.5 31.4  MCHC  --  33.6 34.0  RDW  --  15.7* 15.7*  LYMPHSABS  --  0.6* 0.7  MONOABS  --  0.6 0.7  EOSABS  --  0.0 0.0  BASOSABS  --  0.0 0.0    Chemistries   Recent Labs Lab 05/15/15 04/27/2015 0959 05/19/15 0105  NA 136* 139 138  K 3.7 4.3 2.8*  CL  --  97* 94*  CO2  --  23 28  GLUCOSE  --  137* 125*  BUN 12 16 13   CREATININE 0.4* 0.79 0.69  CALCIUM  --  8.9 8.0*  AST 22  --  33  ALT 10  --  16  ALKPHOS 89  --  108  BILITOT  --   --  2.1*   ------------------------------------------------------------------------------------------------------------------ estimated creatinine clearance is 33.4 mL/min (by C-G formula based on Cr of  0.69). ------------------------------------------------------------------------------------------------------------------ No results for input(s): HGBA1C in the last 72 hours. ------------------------------------------------------------------------------------------------------------------ No results for input(s): CHOL, HDL, LDLCALC, TRIG, CHOLHDL, LDLDIRECT in the last 72 hours. ------------------------------------------------------------------------------------------------------------------ No results for input(s): TSH, T4TOTAL, T3FREE, THYROIDAB in the last 72 hours.  Invalid input(s): FREET3 ------------------------------------------------------------------------------------------------------------------ No results for input(s): VITAMINB12, FOLATE, FERRITIN, TIBC, IRON, RETICCTPCT in the last 72 hours.  Coagulation profile No results for input(s): INR, PROTIME in the last 168 hours.  No results for input(s): DDIMER in the last 72 hours.  Cardiac Enzymes  Recent Labs Lab 05/14/2015 1316 04/26/2015 1905 05/19/15 0105  TROPONINI 1.43* 1.23* 0.96*   ------------------------------------------------------------------------------------------------------------------ Invalid input(s): POCBNP     Time Spent in minutes   30 minutes   Correna Meacham M.D on 05/20/2015 at 10:43 AM  Between 7am to 7pm - Pager - [phone]  After 7pm go to [email or web address] - password [facility]  [facility]   Office  [phone]

## 2015-05-20 NOTE — Progress Notes (Signed)
Utilization Review Completed.Jp Eastham T10/09/2014  

## 2015-05-20 NOTE — Progress Notes (Signed)
Patient had 3 episodes of seizure around 0555 to 0615.  Heart rate increased to 140 and staying in 120's.  NP notified ativan 1 mg IV given. Suction set up at bedside. Placed non rebreather mask on at 15 L. Patient oxygen sat in low 80's.  Pt with labored breathing but appears comfortable at this time.  POA, sister and friend Romie Minus notified on patient's condition.  Will continue to monitor patient and update family.

## 2015-05-20 NOTE — Progress Notes (Signed)
1 mg Morphine Iv given to patient for comfort, friend Romie Minus and chaplin at bedside. Report given to day RN.

## 2015-05-20 NOTE — Progress Notes (Signed)
Daily Progress Note   Patient Name: Laura Benitez       Date: 05/20/2015 DOB: [birth year]  Age: 81 y.o. MRN#: [id number] Attending Physician: Albertine Patricia, MD Primary Care Physician: [name], MD Admit Date: 05/09/2015  Reason for Consultation/Follow-up: Establishing goals of care, Non pain symptom management, Pain control, Psychosocial/spiritual support and Terminal care  Subjective: Pt had what appeared to be seizure-like activity about 0500 per chart review. Pt at this point appears to be actively dying. Resp are shallow,cheyne's stokes resp pattern present. HR tachy and bounding. Knees and feet are mottled. No longer opening her eyes to voice. Unresponsive. Unable to to take anything by mouth. Long time friend and caregiver, Romie Minus, at the bedside. Explained her terminal condition. Romie Minus verbalized understanding. Prognosis of hours to days given to Romie Minus. I also called her HCPOA, Chelsea Aus and told him. Continue with full comfort measures at this point  Interval Events: Seizures; clinical decline Length of Stay: 2 days  Current Medications: Scheduled Meds:  . levETIRAcetam  500 mg Intravenous Q12H  .  morphine injection  1 mg Intravenous 4 times per day  . morphine        Continuous Infusions:    PRN Meds: LORazepam, morphine injection  Palliative Performance Scale: 10%     Vital Signs: BP 124/73 mmHg  Pulse 127  Temp(Src) 97.9 F (36.6 C) (Axillary)  Resp 23  Ht 5\' 5"  (1.651 m)  Wt 40.4 kg (89 lb 1.1 oz)  BMI 14.82 kg/m2  SpO2 84% SpO2: SpO2: (!) 84 % O2 Device: O2 Device: NRB O2 Flow Rate: O2 Flow Rate (L/min): 15 L/min  Intake/output summary:  Intake/Output Summary (Last 24 hours) at 05/20/15 1210 Last data filed at 05/19/15 2108  Gross per 24 hour  Intake    350 ml  Output      0 ml  Net    350 ml   LBM:   Baseline Weight: Weight: 40.4 kg (89 lb 1.1 oz) Most recent weight: Weight: 40.4 kg (89 lb 1.1 oz)  Physical Exam: General: Cachetic  frail elderly female. Unresponsive Resp: Shallow, Cheyne's Stokes pattern Cardiac: Tachy, bounding, irrg   Musculoskeletal: Knees mottled,            Additional Data Reviewed: Recent Labs     05/09/2015  0959  05/19/15  0105  WBC  14.2*  13.6*  HGB  15.3*  13.3  PLT  322  274  NA  139  138  BUN  16  13  CREATININE  0.79  0.69     Problem List:  Patient Active Problem List   Diagnosis Date Noted  . Palliative care encounter   . Elevated troponin I level 04/25/2015  . Acute respiratory failure with hypoxia ([place]) 05/06/2015  . Bilateral pneumonia 05/04/2015  . HCAP (healthcare-associated pneumonia) 05/14/2015  . Hypoxic encephalopathy ([place]) 04/26/2015  . Sepsis due to pneumonia ([place]) 05/01/2015  . Acute bronchitis 05/16/2015  . Osteoporosis 05/14/2015  . Dysphagia, pharyngoesophageal phase 05/14/2015  . Decubitus ulcer of sacral region, stage 3 ([place]) 05/14/2015  . Acute blood loss anemia   . Pulmonary hypertension ([place])   . Tricuspid valve regurgitation   . CAD in native artery   . Closed left hip fracture ([place])   . Hyponatremia 05/05/2015  . Fall at nursing home 05/05/2015  . Thrombocytopenia ([place]) 05/05/2015  . Hypotension 05/05/2015  . Fall   . Alzheimer's disease 05/03/2015  . CAD (coronary artery disease) 01/11/2015  .  PAD (peripheral artery disease) ([place]) 01/11/2015  . Malaise 01/26/2014  . H/O carotid atherosclerosis 10/29/2013  . Hypothyroidism 01/06/2013  . 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 GERD (gastroesophageal reflux disease) 01/06/2013  . DNR (do not resuscitate) 01/06/2013  . Venous insufficiency 12/17/2011  . Constipation 09/11/2011  . Depression 07/10/2011  . Anxiety 07/10/2011     Palliative Care Assessment & Plan    Code Status:  DNR  Goals of Care:  Full comfort care.  Will DC all meds not related to comfort and EOL care  Symptom Management:  Dyspnea: Will schedule ms04 1mg   q6 prn and 1-2 q2 prn  Ativan: Cont prn approach  for now,but will increase range as well as interval to 1-2 q2 prn  Seizures: Now on Keppra 500 mg BID, cont and ativan also availble  Palliative Prophylaxis:  No aggressive bowel regimen. Pt is actively dying.  Psycho-social/Spiritual:  Desire for further Chaplaincy support:yes   Prognosis: Hours - Days Discharge Planning: Anticipate hospital death   Care plan was discussed with HCPOA and Dr. Landis Gandy  Thank you for allowing the Palliative Medicine Team to assist in the care of this patient.   Time In: 0830 Time Out: 0940 Total Time 70 min Prolonged Time Billed  yes     Greater than 50%  of this time was spent counseling and coordinating care related to the above assessment and plan.   Dory Horn, NP  05/20/2015, 12:10 PM  Please contact Palliative Medicine Team phone at [phone] for questions and concerns.

## 2015-05-20 NOTE — Progress Notes (Signed)
Patient with new decreased EF, aspiration, leukocytosis and elevated lactate.  Agree with conservative management at this time.  Continue aspirin and beta blocker as per patient's wishes.  Signing off but please do not hesitate to call cardiology if further management needed.  Kramer Hanrahan 05/20/2015 10:52 AM

## 2015-05-21 DIAGNOSIS — E43 Unspecified severe protein-calorie malnutrition: Secondary | ICD-10-CM | POA: Insufficient documentation

## 2015-05-21 LAB — CULTURE, RESPIRATORY W GRAM STAIN

## 2015-05-21 LAB — CULTURE, RESPIRATORY
GRAM STAIN: NONE SEEN
SPECIAL REQUESTS: NORMAL

## 2015-05-21 MED ORDER — ACETAMINOPHEN 650 MG RE SUPP
650.0000 mg | RECTAL | Status: DC | PRN
  Administered 2015-05-21: 650 mg via RECTAL
  Filled 2015-05-21: qty 1

## 2015-05-21 MED ORDER — GLYCOPYRROLATE 0.2 MG/ML IJ SOLN
0.2000 mg | INTRAMUSCULAR | Status: DC | PRN
  Administered 2015-05-21: 0.2 mg via SUBCUTANEOUS
  Filled 2015-05-21 (×2): qty 1

## 2015-05-21 MED ORDER — GLYCOPYRROLATE 1 MG PO TABS
1.0000 mg | ORAL_TABLET | ORAL | Status: DC | PRN
Start: 2015-05-21 — End: 2015-05-21
  Filled 2015-05-21: qty 1

## 2015-05-21 MED ORDER — SCOPOLAMINE 1 MG/3DAYS TD PT72
1.0000 | MEDICATED_PATCH | TRANSDERMAL | Status: DC
  Administered 2015-05-21: 1.5 mg via TRANSDERMAL
  Filled 2015-05-21: qty 1

## 2015-05-21 MED ORDER — GLYCOPYRROLATE 0.2 MG/ML IJ SOLN
0.2000 mg | INTRAMUSCULAR | Status: DC | PRN
  Filled 2015-05-21: qty 1

## 2015-05-21 MED ORDER — GLYCOPYRROLATE 0.2 MG/ML IJ SOLN: 0.2000 mg | INTRAMUSCULAR | Status: DC | PRN

## 2015-05-21 NOTE — Clinical Social Work Note (Signed)
Clinical Social Work Assessment  Patient Details  Name: Laura Benitez MRN: [id number] Date of Birth: 1929/05/02  Date of referral: 05/21/15   Reason for consult: Facility Placement (Pt is from [facility], but come from [facility])     Permission sought to share information with: Family Supports Permission granted to share information::   Name:: Romie Minus Muson Relationship:: Friend   Housing/Transportation Living arrangements for the past 2 months: [place] of Information: Adult Children Patient Interpreter Needed: None Criminal Activity/Legal Involvement Pertinent to Current Situation/Hospitalization:  No  Significant Relationships: Friend Lives with: Facility Resident Do you feel safe going back to the place where you live? No Need for family participation in patient care: No (Coment)  Care giving concerns: N/A   Facilities manager / plan: CSW spoke with the pt's friend Romie Minus over the phone. CSW introduced self and purpose of the call. Romie Minus confirmed that the pt is an ALF resident at [facility], but she received rehab from [facility]. CSW answered all questions in which the Romie Minus inquired about. CSW will continue to follow this pt and assist with discharge as needed.   Employment status: Retired Engineer, manufacturing systems PT Recommendations: Not assessed at this time Information / Referral to community resources:  (N/A)  Patient/Family's Response to care: Romie Minus reported that the care in which the pt has received as been good so far.   Patient/Family's Understanding of and Emotional Response to Diagnosis, Current Treatment, and Prognosis: Romie Minus provided some detail about this admission. Romie Minus shared that the pt is sicker than she expected. Romie Minus does not feel that the pt can so back to [facility]  given her health.    Emotional Assessment Appearance: Appears stated age Attitude/Demeanor/Rapport: Unable to Assess Affect (typically observed): Unable to Assess Orientation: Oriented to Self Alcohol / Substance use: Not Applicable Psych involvement (Current and /or in the community): No (Comment)  Discharge Needs  Concerns to be addressed: Denies Needs/Concerns at this time Readmission within the last 30 days: No Current discharge risk: None Barriers to Discharge: No Barriers Identified   Terrell [place], MSW, [place]

## 2015-05-21 NOTE — Progress Notes (Signed)
Daily Progress Note   Patient Name: Laura Benitez       Date: 05/21/2015 DOB: [birth year]  Age: 81 y.o. MRN#: [id number] Attending Physician: Albertine Patricia, MD Primary Care Physician: [name], MD Admit Date: 04/23/2015  Reason for Consultation/Follow-up: GOC  Subjective:     Laura Benitez is very thin and bed bound. She is not responsive with eyes slightly open but does not open. She is hot to tough and temperature found to be 100.2 axillary - will order tylenol prn (may consider IV toradol if needing frequent suppository). No family/friends at bedside. She is becoming congested although not gurgling yet - prn in place for comfort. Spoke with RN about replacing NRB with nasal cannula for comfort. Will maintain telemetry as a comfort as she has had recent seizure activity.    Length of Stay: 3 days  Current Medications: Scheduled Meds:  . levETIRAcetam  500 mg Intravenous Q12H  .  morphine injection  1 mg Intravenous 4 times per day  . scopolamine  1 patch Transdermal Q72H    Continuous Infusions:    PRN Meds: acetaminophen, [DISCONTINUED] glycopyrrolate **OR** glycopyrrolate **OR** [DISCONTINUED] glycopyrrolate, LORazepam, morphine injection  Palliative Performance Scale: 10%     Vital Signs: BP 124/73 mmHg  Pulse 127  Temp(Src) 97.8 F (36.6 C) (Oral)  Resp 23  Ht 5\' 5"  (1.651 m)  Wt 40.4 kg (89 lb 1.1 oz)  BMI 14.82 kg/m2  SpO2 84% SpO2: SpO2: (!) 84 % O2 Device: O2 Device: NRB O2 Flow Rate: O2 Flow Rate (L/min): 15 L/min  Intake/output summary:  Intake/Output Summary (Last 24 hours) at 05/21/15 1057 Last data filed at 05/20/15 2200  Gross per 24 hour  Intake    105 ml  Output      0 ml  Net    105 ml   Baseline Weight: Weight: 40.4 kg (89 lb 1.1 oz) Most recent weight: Weight: 40.4 kg (89 lb 1.1 oz)  Physical Exam: General: NAD, lying in bed HEENT: Severe temporal muscle wasting CVS: Tachycardic Resp: Tachypneic, congested Neuro: Unresponsive      Additional Data Reviewed: Recent Labs     05/19/15  0105  WBC  13.6*  HGB  13.3  PLT  274  NA  138  BUN  13  CREATININE  0.69     Problem List:  Patient Active Problem List   Diagnosis Date Noted  . Palliative care by specialist   . Palliative care encounter   . Elevated troponin I level 05/17/2015  . Acute respiratory failure with hypoxia ([place]) 04/22/2015  . Bilateral pneumonia 05/17/2015  . HCAP (healthcare-associated pneumonia) 05/04/2015  . Hypoxic encephalopathy ([place]) 05/17/2015  . Sepsis due to pneumonia ([place]) 04/28/2015  . Acute bronchitis 05/16/2015  . Osteoporosis 05/14/2015  . Dysphagia, pharyngoesophageal phase 05/14/2015  . Decubitus ulcer of sacral region, stage 3 ([place]) 05/14/2015  . Acute blood loss anemia   . Pulmonary hypertension ([place])   . Tricuspid valve regurgitation   . CAD in native artery   . Closed left hip fracture ([place])   . Hyponatremia 05/05/2015  . Fall at nursing home 05/05/2015  . Thrombocytopenia ([employer]) 05/05/2015  . Hypotension 05/05/2015  . Fall   . Alzheimer's disease 05/03/2015  . CAD (coronary artery disease) 01/11/2015  . PAD (peripheral artery disease) ([place]) 01/11/2015  . Malaise 01/26/2014  . H/O carotid atherosclerosis 10/29/2013  . Hypothyroidism 01/06/2013  . 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 GERD (gastroesophageal reflux disease) 01/06/2013  .  DNR (do not resuscitate) 01/06/2013  . Venous insufficiency 12/17/2011  . Constipation 09/11/2011  . Depression 07/10/2011  . Anxiety 07/10/2011     Palliative Care Assessment & Plan    Code Status:  DNR  Goals of Care:  Full comfort care.   3. Symptom Management:  Dyspnea: Morphine 1 mgevery 6 hours and 1-2 mg every 2 hours prn.   Anxiety/seizure: Ativan 1-2 mg every 2 hours prn.   Seizures: Continue Keppra 500 mg BID and ativan prn.   Fever: Acetaminophen every 4 hours prn.     4. Prognosis: Very poor - likely hours.   5. Discharge Planning:  Anticipate hospital death.    Thank you for allowing the Palliative Medicine Team to assist in the care of this patient.   Time In: 1045 Time Out: 1105 Total Time 41min Prolonged Time Billed  no     Greater than 50%  of this time was spent counseling and coordinating care related to the above assessment and plan.     Vinie Sill, NP Palliative Medicine Team Pager # [phone] (M-F 8a-5p) Team Phone # [phone] (Nights/Weekends)  05/21/2015, 10:57 AM

## 2015-05-21 NOTE — Progress Notes (Signed)
Patient Demographics  Laura Benitez, is a 81 y.o. female, DOB - 30-Aug-1928, AOZ:[id number]  Admit date - 04/24/2015   Admitting Physician Reyne Dumas, MD  Outpatient Primary MD for the patient is GREEN, Viviann Spare, MD  LOS - 3   Chief Complaint  Patient presents with  . Shortness of Breath       Admission HPI/Brief narrative: 81 year old female with recent history of left hip fracture and underwent open reduction and internal fixation of the left femur on 05/09/2015 by Dr. Erlinda Hong. During previous admission, Presented to the ER with significant hypoxia initial oxygen saturation 60%, CT scan significant for bilateral opacities,  started on broad-spectrum antibiotics for HCAP, as well found to have elevated troponin, echo findings significant for drop in EF, and LAD territory wall motion abnormality, finding consistent with non-STEMI.  Subjective:   Laura Benitez today is unresponsive.   Assessment & Plan    Principal Problem:   Sepsis due to pneumonia [facility]) Active Problems:   Hypothyroidism   DNR (do not resuscitate)   CAD (coronary artery disease)   PAD (peripheral artery disease) ([place])   Alzheimer's disease   Elevated troponin I level   Acute respiratory failure with hypoxia ([place])   Bilateral pneumonia   HCAP (healthcare-associated pneumonia)   Hypoxic encephalopathy ([place])   Palliative care encounter   Palliative care by specialist  Acute  respiratory failure - This is secondary to HCAP, no further BiPAP. - Unlikely to recover, discussed with sister   sepsis secondary to HCAP - Presents with tachypnea, tachycardia and leukocytosis   Non-STEMI  - Patient presents with elevated troponin, echo finding with significant reduction in EF and LAD territory wall motion abnormality . - Cardiology does not recommend any invasive workup ,   hypokalemia  - Repleted.  Acute encephalopathy - Metabolic  secondary to infectious process, and respiratory failure.  Seizures - Patient had 3 episodes of brief seizures overnight, and this point not a candidate for any invasive workup, as she is actively dying, we'll manage with ER and Ativan as needed. On IV Keppra.  Goals of care : - Palliative care consult greatly appreciated, patient is  full comfort measures at this point, managed by palliative care, added scopolamine patch and glycopyrrolate for oral secretions.  Code Status: DO NOT RESUSCITATE   Family Communication: None at bedside  Disposition Plan: Anticipate death during hospital stay   Procedures  None   Consults   Cardiology   Medications  Scheduled Meds: . levETIRAcetam  500 mg Intravenous Q12H  .  morphine injection  1 mg Intravenous 4 times per day  . scopolamine  1 patch Transdermal Q72H   Continuous Infusions:  PRN Meds:.  DVT Prophylaxis  comfort care  Lab Results  Component Value Date   PLT 274 05/19/2015    Antibiotics    Anti-infectives    Start     Dose/Rate Route Frequency Ordered Stop   05/19/15 1130  vancomycin (VANCOCIN) 500 mg in sodium chloride 0.9 % 100 mL IVPB  Status:  Discontinued     500 mg 100 mL/hr over 60 Minutes Intravenous Every 24 hours 05/06/2015 1631 05/20/15 0925   04/24/2015 2030  cefTAZidime (FORTAZ) 2 g in dextrose 5 % 50 mL IVPB  Status:  Discontinued     2 g 100 mL/hr over 30 Minutes Intravenous Every 12 hours 05/01/2015 1632 05/20/15 0924   04/20/2015 1115  vancomycin (VANCOCIN) IVPB 1000 mg/200 mL premix     1,000 mg 200 mL/hr over 60 Minutes Intravenous  Once 05/08/2015 1105 04/26/2015 1228   05/02/2015 1100  vancomycin (VANCOCIN) 1,500 mg in sodium chloride 0.9 % 500 mL IVPB  Status:  Discontinued     1,500 mg 250 mL/hr over 120 Minutes Intravenous  Once 04/28/2015 1048 04/27/2015 1058   05/10/2015 1100  piperacillin-tazobactam (ZOSYN) IVPB 2.25 g  Status:  Discontinued     2.25 g 100 mL/hr over 30 Minutes Intravenous  Once 04/20/2015  1048 04/22/2015 1057   04/25/2015 1100  piperacillin-tazobactam (ZOSYN) IVPB 3.375 g     3.375 g 12.5 mL/hr over 240 Minutes Intravenous  Once 04/23/2015 1058 05/02/2015 1719   05/17/2015 1100  vancomycin (VANCOCIN) 1,500 mg in sodium chloride 0.9 % 500 mL IVPB  Status:  Discontinued     1,500 mg 250 mL/hr over 120 Minutes Intravenous  Once 05/12/2015 1058 05/04/2015 1105          Objective:   Filed Vitals:   05/19/15 2324 05/20/15 0414 05/20/15 0701 05/21/15 0924  BP: 107/65 115/71 124/73   Pulse: 78 83 127   Temp: 98.2 F (36.8 C) 97.9 F (36.6 C)  97.8 F (36.6 C)  TempSrc: Axillary Axillary  Oral  Resp: 24 27 23    Height:      Weight:      SpO2: 86% 91% 84%     Wt Readings from Last 3 Encounters:  05/13/2015 40.4 kg (89 lb 1.1 oz)  05/14/15 43.092 kg (95 lb)  05/12/15 39.2 kg (86 lb 6.7 oz)     Intake/Output Summary (Last 24 hours) at 05/21/15 0947 Last data filed at 05/20/15 2200  Gross per 24 hour  Intake    105 ml  Output      0 ml  Net    105 ml     Physical Exam  Frail ,unresponsive. dry oral mucosa Supple Neck  Decreased air entry bilaterally,shallow breathing, agonal breathing S1,S2 +,  cool extremities.  Data Review   Micro Results Recent Results (from the past 240 hour(s))  Blood culture (routine x 2)     Status: None (Preliminary result)   Collection Time: 04/20/2015  9:59 AM  Result Value Ref Range Status   Specimen Description BLOOD RIGHT ARM  Final   Special Requests BOTTLES DRAWN AEROBIC ONLY 2CC  Final   Culture NO GROWTH 2 DAYS  Final   Report Status PENDING  Incomplete  Blood culture (routine x 2)     Status: None (Preliminary result)   Collection Time: 05/16/2015 11:13 AM  Result Value Ref Range Status   Specimen Description BLOOD RIGHT ANTECUBITAL  Final   Special Requests BOTTLES DRAWN AEROBIC AND ANAEROBIC 5CC  Final   Culture NO GROWTH 2 DAYS  Final   Report Status PENDING  Incomplete  Culture, respiratory (NON-Expectorated)     Status:  None (Preliminary result)   Collection Time: 04/21/2015  6:07 PM  Result Value Ref Range Status   Specimen Description TRACHEAL ASPIRATE  Final   Special Requests Normal  Final   Gram Stain   Final    NO WBC SEEN MODERATE SQUAMOUS EPITHELIAL CELLS PRESENT FEW YEAST Performed at [facility]    Culture   Final    FEW CANDIDA ALBICANS Performed at  [facility]    Report Status PENDING  Incomplete    Radiology Reports Ct Head Wo Contrast  05/05/2015   CLINICAL DATA:  81 year old female with fall  EXAM: CT HEAD WITHOUT CONTRAST  CT CERVICAL SPINE WITHOUT CONTRAST  TECHNIQUE: Multidetector CT imaging of the head and cervical spine was performed following the standard protocol without intravenous contrast. Multiplanar CT image reconstructions of the cervical spine were also generated.  COMPARISON:  None.  FINDINGS: CT HEAD FINDINGS  There is mild dilatation of the ventricles and sulci compatible with age-related atrophy. Periventricular and deep white matter hypodensities represent chronic microvascular ischemic changes. Left frontal old infarct and encephalomalacia. There is a small focal area of encephalomalacia changes in the anterior left temporal lobe, likely related to an old insult. There is no intracranial hemorrhage. No mass effect or midline shift identified.  The visualized paranasal sinuses and mastoid air cells are well aerated. A small focal lucency noted in the occiput. The calvarium is intact.  CT CERVICAL SPINE FINDINGS  There is no acute fracture or subluxation of the cervical spine.There is advanced osteopenia. Multilevel degenerative changes.The odontoid and spinous processes are intact.There is normal anatomic alignment of the C1-C2 lateral masses. The visualized soft tissues appear unremarkable.  IMPRESSION: No acute intracranial pathology.  Age-related atrophy and chronic microvascular ischemic disease. 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  Advanced  osteopenia.  No acute/traumatic cervical spine pathology.   Electronically Signed   By: Anner Crete M.D.   On: 05/05/2015 03:58   Ct Angio Chest Pe W/cm &/or Wo Cm  04/28/2015   CLINICAL DATA:  Shortness of breath, low O2 sats  EXAM: CT ANGIOGRAPHY CHEST WITH CONTRAST  TECHNIQUE: Multidetector CT imaging of the chest was performed using the standard protocol during bolus administration of intravenous contrast. Multiplanar CT image reconstructions and MIPs were obtained to evaluate the vascular anatomy.  CONTRAST:  64mL OMNIPAQUE IOHEXOL 350 MG/ML SOLN  COMPARISON:  None.  FINDINGS: There is adequate opacification of the pulmonary arteries. There is no pulmonary embolus. The main pulmonary artery, right main pulmonary artery and left main pulmonary arteries are normal in size. The heart size is enlarged. There is no pericardial effusion.  There is a moderate right and small left pleural effusion with bibasilar atelectasis. There is no pneumothorax. There are patchy ground-glass opacities bilaterally.  There is no axillary, hilar, or mediastinal adenopathy.  There is no lytic or blastic osseous lesion.  There is a severe chronic L1 vertebral body compression fracture. There is a severe age-indeterminate T8 vertebral body compression fracture. There are mild age-indeterminate T9, T10, T11 and T12 vertebral body compression fractures.  Review of the MIP images confirms the above findings.  IMPRESSION: 1. No evidence of pulmonary embolus. 2. No evidence of thoracic aortic dissection. 3. Findings most concerning for CHF.   Electronically Signed   By: Kathreen Devoid   On: 05/09/2015 11:39   Ct Cervical Spine Wo Contrast  05/05/2015   CLINICAL DATA:  81 year old female with fall  EXAM: CT HEAD WITHOUT CONTRAST  CT CERVICAL SPINE WITHOUT CONTRAST  TECHNIQUE: Multidetector CT imaging of the head and cervical spine was performed following the standard protocol without intravenous contrast. Multiplanar CT image  reconstructions of the cervical spine were also generated.  COMPARISON:  None.  FINDINGS: CT HEAD FINDINGS  There is mild dilatation of the ventricles and sulci compatible with age-related atrophy. Periventricular and deep white matter hypodensities represent chronic microvascular ischemic changes. Left frontal  old infarct and encephalomalacia. There is a small focal area of encephalomalacia changes in the anterior left temporal lobe, likely related to an old insult. There is no intracranial hemorrhage. No mass effect or midline shift identified.  The visualized paranasal sinuses and mastoid air cells are well aerated. A small focal lucency noted in the occiput. The calvarium is intact.  CT CERVICAL SPINE FINDINGS  There is no acute fracture or subluxation of the cervical spine.There is advanced osteopenia. Multilevel degenerative changes.The odontoid and spinous processes are intact.There is normal anatomic alignment of the C1-C2 lateral masses. The visualized soft tissues appear unremarkable.  IMPRESSION: No acute intracranial pathology.  Age-related atrophy and chronic microvascular ischemic disease. 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  Advanced osteopenia.  No acute/traumatic cervical spine pathology.   Electronically Signed   By: Anner Crete M.D.   On: 05/05/2015 03:58   Ct Pelvis Wo Contrast  05/05/2015   CLINICAL DATA:  Left hip pain after fall. Concern for left hip fracture.  EXAM: CT PELVIS WITHOUT CONTRAST  TECHNIQUE: Multidetector CT imaging of the pelvis was performed following the standard protocol without intravenous contrast.  COMPARISON:  Radiographs earlier this day.  FINDINGS: There is a minimally comminuted and minimally displaced fracture of the intertrochanteric left femur primarily involving the greater trochanter. No extension of the femoral neck. The femoral head is intact. Severe osteoporosis. Right hip arthroplasty in place. Remote fracture of the right and left  inferior pubic rami. Bony pelvis is otherwise intact. Urinary bladder is significantly distended.  IMPRESSION: Minimally comminuted and displaced intertrochanteric left hip fracture. The femoral head is intact.   Electronically Signed   By: Jeb Levering M.D.   On: 05/05/2015 06:25   Dg Chest Port 1 View  05/19/2015   CLINICAL DATA:  Healthcare associated pneumonia  EXAM: PORTABLE CHEST 1 VIEW  COMPARISON:  Yesterday  FINDINGS: Bibasilar airspace disease with bronchograms. Superimposed small to moderate pleural effusions. Stable heart size and mediastinal contours when allowing for rotation.  IMPRESSION: Bibasilar pneumonia with small to moderate pleural effusion, similar to yesterday. Pattern and extensive airway filling on yesterday CT raises concern for underlying aspiration.   Electronically Signed   By: Monte Fantasia M.D.   On: 05/19/2015 06:13   Dg Chest Port 1 View  05/15/2015   CLINICAL DATA:  Hypoxia/respiratory failure  EXAM: PORTABLE CHEST 1 VIEW  COMPARISON:  Chest CT May 18, 2015; chest radiograph July 10, 2011  FINDINGS: There are bilateral pleural effusions with relatively mild interstitial edema. Heart is mildly enlarged. There is mild pulmonary venous hypertension. There is patchy bibasilar atelectasis.  Bones are osteoporotic. There is increase in kyphosis. No adenopathy apparent.  IMPRESSION: Findings consistent with congestive heart failure. There is no apparent change compared to CT obtained earlier in the day.   Electronically Signed   By: Lowella Grip III M.D.   On: 04/23/2015 19:52   Dg Hip Operative Unilat With Pelvis Left  05/09/2015   CLINICAL DATA:  Left hip fracture.  EXAM: OPERATIVE left HIP (WITH PELVIS IF PERFORMED) 2 VIEWS  TECHNIQUE: Fluoroscopic spot image(s) were submitted for interpretation post-operatively.  COMPARISON:  Radiographs and CT scan dated 05/05/2015  FINDINGS: The patient has undergone open reduction and internal fixation of the proximal  left femur fracture. Two screws and and intramedullary nail have been inserted and appear in excellent position. Alignment and position of the fracture fragments is anatomic.  IMPRESSION: Open reduction and internal fixation of left femur fracture.  Electronically Signed   By: Lorriane Shire M.D.   On: 05/09/2015 17:36   Dg Hip Unilat With Pelvis 2-3 Views Left  05/05/2015   CLINICAL DATA:  81 year old female with fall and left hip pain.  EXAM: DG HIP (WITH OR WITHOUT PELVIS) 2-3V LEFT  COMPARISON:  None.  FINDINGS: There is severe osteopenia which limits evaluation of the study for fracture. There is a right total hip arthroplasty. There is a focal area of cortical discontinue the in the medial portion of the left femoral head, likely artifactual. A cortical fracture is less likely. Clinical correlation is recommended. No definite fracture identified. There is no dislocation on the provided images. CT may provide better evaluation for fracture if clinically indicated.  The soft tissues are grossly unremarkable.  IMPRESSION: Severe osteopenia.  No definite acute fracture or dislocation.  Apparent cortical discontinuity along the medial aspect of the left femoral head is most likely artifactual and less likely represents a cortical fracture. Clinical correlation is recommended.   Electronically Signed   By: Anner Crete M.D.   On: 05/05/2015 03:51     CBC  Recent Labs Lab 05/15/15 05/12/2015 0959 05/19/15 0105  WBC 13.5 14.2* 13.6*  HGB 13.8 15.3* 13.3  HCT 39 45.5 39.1  PLT 210 322 274  MCV  --  93.8 92.4  MCH  --  31.5 31.4  MCHC  --  33.6 34.0  RDW  --  15.7* 15.7*  LYMPHSABS  --  0.6* 0.7  MONOABS  --  0.6 0.7  EOSABS  --  0.0 0.0  BASOSABS  --  0.0 0.0    Chemistries   Recent Labs Lab 05/15/15 05/08/2015 0959 05/19/15 0105  NA 136* 139 138  K 3.7 4.3 2.8*  CL  --  97* 94*  CO2  --  23 28  GLUCOSE  --  137* 125*  BUN 12 16 13   CREATININE 0.4* 0.79 0.69  CALCIUM  --  8.9  8.0*  AST 22  --  33  ALT 10  --  16  ALKPHOS 89  --  108  BILITOT  --   --  2.1*   ------------------------------------------------------------------------------------------------------------------ estimated creatinine clearance is 33.4 mL/min (by C-G formula based on Cr of 0.69). ------------------------------------------------------------------------------------------------------------------ No results for input(s): HGBA1C in the last 72 hours. ------------------------------------------------------------------------------------------------------------------ No results for input(s): CHOL, HDL, LDLCALC, TRIG, CHOLHDL, LDLDIRECT in the last 72 hours. ------------------------------------------------------------------------------------------------------------------ No results for input(s): TSH, T4TOTAL, T3FREE, THYROIDAB in the last 72 hours.  Invalid input(s): FREET3 ------------------------------------------------------------------------------------------------------------------ No results for input(s): VITAMINB12, FOLATE, FERRITIN, TIBC, IRON, RETICCTPCT in the last 72 hours.  Coagulation profile No results for input(s): INR, PROTIME in the last 168 hours.  No results for input(s): DDIMER in the last 72 hours.  Cardiac Enzymes  Recent Labs Lab 05/09/2015 1316 04/25/2015 1905 05/19/15 0105  TROPONINI 1.43* 1.23* 0.96*   ------------------------------------------------------------------------------------------------------------------ Invalid input(s): POCBNP     Time Spent in minutes   20 minutes   ELGERGAWY, DAWOOD M.D on 05/21/2015 at 9:47 AM  Between 7am to 7pm - Pager - [phone]  After 7pm go to [email or web address] - password [facility]  [facility]   Office  [phone]

## 2015-05-22 NOTE — Care Management Important Message (Signed)
Important Message  Patient Details  Name: Laura Benitez MRN: [id number] Date of Birth: 10/13/1928   Medicare Important Message Given:  Yes-second notification given    [name] May 05/22/2015, 2:00 PM

## 2015-05-22 NOTE — Progress Notes (Signed)
At ~2058 patient began having rhythmic twitching to right upper and lower extremities and vertical nystagmus until ~ 2115.  Keppra 500mg  IV was infusing, SaO2 unchanged at 88%. Gave 1 mg of ativan IV. After medical intervention physical signs previously described of episodes ceased.

## 2015-05-22 NOTE — Care Management Note (Signed)
Case Management Note  Patient Details  Name: Laura Benitez MRN: [id number] Date of Birth: [birth year]  Subjective/Objective:              Admitted with sepsis, from [facility],  recent history of left hip fracture and underwent open reduction and internal fixation of the left femur on 05/09/2015 .   Action/Plan: Palliative following,anticipate hospital death.  No needs identified per CM @ this time.  Expected Discharge Date:                  Expected Discharge Plan:     In-House Referral:     Discharge planning Services     Post Acute Care Choice:    Choice offered to:     DME Arranged:    DME Agency:     HH Arranged:    [place] Agency:     Status of Service:     Medicare Important Message Given:   Date Medicare IM Given:    Medicare IM give by:    Date Additional Medicare IM Given:    Additional Medicare Important Message give by:     If discussed at Rule of Stay Meetings, dates discussed:    Additional Comments:  Joy Pinson(SISTER) at [phone]  [address] [place], [place]  [phone]  05/22/2015, 3:46 PM

## 2015-05-22 NOTE — Progress Notes (Signed)
Pt TX to [id number], called report; called family to notify of [place].

## 2015-05-22 NOTE — Progress Notes (Signed)
Patient Demographics  Laura Benitez, is a 81 y.o. female, DOB - 04-Dec-1928, RKY:[id number]  Admit date - 05/04/2015   Admitting Physician Reyne Dumas, MD  Outpatient Primary MD for the patient is GREEN, Viviann Spare, MD  LOS - 4   Chief Complaint  Patient presents with  . Shortness of Breath       Admission HPI/Brief narrative: 81 year old female with recent history of left hip fracture and underwent open reduction and internal fixation of the left femur on 05/09/2015 by Dr. Erlinda Hong. During previous admission, Presented to the ER with significant hypoxia initial oxygen saturation 60%, CT scan significant for bilateral opacities,  started on broad-spectrum antibiotics for HCAP, as well found to have elevated troponin, echo findings significant for drop in EF, and LAD territory wall motion abnormality, finding consistent with non-STEMI.  Subjective:   Laura Benitez  is unresponsive.   Assessment & Plan    Principal Problem:   Sepsis due to pneumonia [facility]) Active Problems:   Hypothyroidism   DNR (do not resuscitate)   CAD (coronary artery disease)   PAD (peripheral artery disease) ([place])   Alzheimer's disease   Elevated troponin I level   Acute respiratory failure with hypoxia ([place])   Bilateral pneumonia   HCAP (healthcare-associated pneumonia)   Hypoxic encephalopathy ([place])   Palliative care encounter   Palliative care by specialist   Protein-calorie malnutrition, severe ([place])  Acute  respiratory failure - This is secondary to HCAP, no further BiPAP. - Patient is actively dying   sepsis secondary to HCAP - Presents with tachypnea, tachycardia and leukocytosis   Non-STEMI  - Patient presents with elevated troponin, echo finding with significant reduction in EF and LAD territory wall motion abnormality . - Cardiology does not recommend any invasive workup ,   hypokalemia  - Repleted, no further  labs  Acute encephalopathy - Metabolic secondary to infectious process, and respiratory failure.  Seizures - Patient had 3 episodes of brief seizures overnight, and this point not a candidate for any invasive workup, as she is actively dying, we'll manage with ER and Ativan as needed. On IV Keppra.  - Had another episode of twitching yesterday as well, unclear if related to seizures are not, it resolved with IV Ativan, no recurrence since, already on IV Keppra.  Goals of care : - Palliative care consult greatly appreciated, patient is  full comfort measures at this point, managed by palliative care, added scopolamine patch and glycopyrrolate for oral secretions.  Code Status: DO NOT RESUSCITATE /Comfort Care  Family Communication: None at bedside  Disposition Plan: Anticipate death during hospital stay   Procedures  None   Consults   Cardiology Palliative care  Medications  Scheduled Meds: . levETIRAcetam  500 mg Intravenous Q12H  .  morphine injection  1 mg Intravenous 4 times per day  . scopolamine  1 patch Transdermal Q72H   Continuous Infusions:  PRN Meds:.  DVT Prophylaxis  comfort care  Lab Results  Component Value Date   PLT 274 05/19/2015    Antibiotics    Anti-infectives    Start     Dose/Rate Route Frequency Ordered Stop   05/19/15 1130  vancomycin (VANCOCIN) 500 mg in sodium chloride 0.9 % 100 mL IVPB  Status:  Discontinued     500 mg 100 mL/hr over 60 Minutes Intravenous Every 24 hours 05/13/2015 1631 05/20/15 0925   05/01/2015 2030  cefTAZidime (FORTAZ) 2 g in dextrose 5 % 50 mL IVPB  Status:  Discontinued     2 g 100 mL/hr over 30 Minutes Intravenous Every 12 hours 05/08/2015 1632 05/20/15 0924   05/10/2015 1115  vancomycin (VANCOCIN) IVPB 1000 mg/200 mL premix     1,000 mg 200 mL/hr over 60 Minutes Intravenous  Once 05/03/2015 1105 04/30/2015 1228   04/30/2015 1100  vancomycin (VANCOCIN) 1,500 mg in sodium chloride 0.9 % 500 mL IVPB  Status:  Discontinued       1,500 mg 250 mL/hr over 120 Minutes Intravenous  Once 05/13/2015 1048 05/14/2015 1058   05/06/2015 1100  piperacillin-tazobactam (ZOSYN) IVPB 2.25 g  Status:  Discontinued     2.25 g 100 mL/hr over 30 Minutes Intravenous  Once 05/16/2015 1048 05/02/2015 1057   05/02/2015 1100  piperacillin-tazobactam (ZOSYN) IVPB 3.375 g     3.375 g 12.5 mL/hr over 240 Minutes Intravenous  Once 05/14/2015 1058 05/05/2015 1719   05/11/2015 1100  vancomycin (VANCOCIN) 1,500 mg in sodium chloride 0.9 % 500 mL IVPB  Status:  Discontinued     1,500 mg 250 mL/hr over 120 Minutes Intravenous  Once 04/19/2015 1058 04/30/2015 1105          Objective:   Filed Vitals:   05/21/15 1455 05/21/15 2100 05/22/15 0000 05/22/15 0816  BP:      Pulse: 114  106   Temp: 99.9 F (37.7 C) 97.6 F (36.4 C)  97.7 F (36.5 C)  TempSrc: Axillary Axillary  Axillary  Resp: 20  20   Height:      Weight:      SpO2: 87%  85%     Wt Readings from Last 3 Encounters:  05/04/2015 40.4 kg (89 lb 1.1 oz)  05/14/15 43.092 kg (95 lb)  05/12/15 39.2 kg (86 lb 6.7 oz)     Intake/Output Summary (Last 24 hours) at 05/22/15 1143 Last data filed at 05/22/15 0912  Gross per 24 hour  Intake    210 ml  Output      0 ml  Net    210 ml     Physical Exam  Frail ,unresponsive, appears comfortable dry oral mucosa Supple Neck  Decreased air entry bilaterally,shallow breathing, agonal breathing S1,S2 +,  cool extremities.  Data Review   Micro Results Recent Results (from the past 240 hour(s))  Blood culture (routine x 2)     Status: None (Preliminary result)   Collection Time: 05/17/2015  9:59 AM  Result Value Ref Range Status   Specimen Description BLOOD RIGHT ARM  Final   Special Requests BOTTLES DRAWN AEROBIC ONLY 2CC  Final   Culture NO GROWTH 4 DAYS  Final   Report Status PENDING  Incomplete  Blood culture (routine x 2)     Status: None (Preliminary result)   Collection Time: 04/29/2015 11:13 AM  Result Value Ref Range Status   Specimen  Description BLOOD RIGHT ANTECUBITAL  Final   Special Requests BOTTLES DRAWN AEROBIC AND ANAEROBIC 5CC  Final   Culture NO GROWTH 4 DAYS  Final   Report Status PENDING  Incomplete  Culture, respiratory (NON-Expectorated)     Status: None   Collection Time: 04/24/2015  6:07 PM  Result Value Ref Range Status   Specimen Description TRACHEAL ASPIRATE  Final   Special Requests Normal  Final  Gram Stain   Final    NO WBC SEEN MODERATE SQUAMOUS EPITHELIAL CELLS PRESENT FEW YEAST Performed at [facility]    Culture   Final    FEW CANDIDA ALBICANS Performed at [facility]    Report Status 05/21/2015 FINAL  Final    Radiology Reports Ct Head Wo Contrast  05/05/2015   CLINICAL DATA:  82 year old female with fall  EXAM: CT HEAD WITHOUT CONTRAST  CT CERVICAL SPINE WITHOUT CONTRAST  TECHNIQUE: Multidetector CT imaging of the head and cervical spine was performed following the standard protocol without intravenous contrast. Multiplanar CT image reconstructions of the cervical spine were also generated.  COMPARISON:  None.  FINDINGS: CT HEAD FINDINGS  There is mild dilatation of the ventricles and sulci compatible with age-related atrophy. Periventricular and deep white matter hypodensities represent chronic microvascular ischemic changes. Left frontal old infarct and encephalomalacia. There is a small focal area of encephalomalacia changes in the anterior left temporal lobe, likely related to an old insult. There is no intracranial hemorrhage. No mass effect or midline shift identified.  The visualized paranasal sinuses and mastoid air cells are well aerated. A small focal lucency noted in the occiput. The calvarium is intact.  CT CERVICAL SPINE FINDINGS  There is no acute fracture or subluxation of the cervical spine.There is advanced osteopenia. Multilevel degenerative changes.The odontoid and spinous processes are intact.There is normal anatomic alignment of the C1-C2 lateral masses. The  visualized soft tissues appear unremarkable.  IMPRESSION: No acute intracranial pathology.  Age-related atrophy and chronic microvascular ischemic disease. 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  Advanced osteopenia.  No acute/traumatic cervical spine pathology.   Electronically Signed   By: Anner Crete M.D.   On: 05/05/2015 03:58   Ct Angio Chest Pe W/cm &/or Wo Cm  05/01/2015   CLINICAL DATA:  Shortness of breath, low O2 sats  EXAM: CT ANGIOGRAPHY CHEST WITH CONTRAST  TECHNIQUE: Multidetector CT imaging of the chest was performed using the standard protocol during bolus administration of intravenous contrast. Multiplanar CT image reconstructions and MIPs were obtained to evaluate the vascular anatomy.  CONTRAST:  16mL OMNIPAQUE IOHEXOL 350 MG/ML SOLN  COMPARISON:  None.  FINDINGS: There is adequate opacification of the pulmonary arteries. There is no pulmonary embolus. The main pulmonary artery, right main pulmonary artery and left main pulmonary arteries are normal in size. The heart size is enlarged. There is no pericardial effusion.  There is a moderate right and small left pleural effusion with bibasilar atelectasis. There is no pneumothorax. There are patchy ground-glass opacities bilaterally.  There is no axillary, hilar, or mediastinal adenopathy.  There is no lytic or blastic osseous lesion.  There is a severe chronic L1 vertebral body compression fracture. There is a severe age-indeterminate T8 vertebral body compression fracture. There are mild age-indeterminate T9, T10, T11 and T12 vertebral body compression fractures.  Review of the MIP images confirms the above findings.  IMPRESSION: 1. No evidence of pulmonary embolus. 2. No evidence of thoracic aortic dissection. 3. Findings most concerning for CHF.   Electronically Signed   By: Kathreen Devoid   On: 05/08/2015 11:39   Ct Cervical Spine Wo Contrast  05/05/2015   CLINICAL DATA:  81 year old female with fall  EXAM: CT HEAD WITHOUT  CONTRAST  CT CERVICAL SPINE WITHOUT CONTRAST  TECHNIQUE: Multidetector CT imaging of the head and cervical spine was performed following the standard protocol without intravenous contrast. Multiplanar CT image reconstructions of the cervical spine  were also generated.  COMPARISON:  None.  FINDINGS: CT HEAD FINDINGS  There is mild dilatation of the ventricles and sulci compatible with age-related atrophy. Periventricular and deep white matter hypodensities represent chronic microvascular ischemic changes. Left frontal old infarct and encephalomalacia. There is a small focal area of encephalomalacia changes in the anterior left temporal lobe, likely related to an old insult. There is no intracranial hemorrhage. No mass effect or midline shift identified.  The visualized paranasal sinuses and mastoid air cells are well aerated. A small focal lucency noted in the occiput. The calvarium is intact.  CT CERVICAL SPINE FINDINGS  There is no acute fracture or subluxation of the cervical spine.There is advanced osteopenia. Multilevel degenerative changes.The odontoid and spinous processes are intact.There is normal anatomic alignment of the C1-C2 lateral masses. The visualized soft tissues appear unremarkable.  IMPRESSION: No acute intracranial pathology.  Age-related atrophy and chronic microvascular ischemic disease. 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  Advanced osteopenia.  No acute/traumatic cervical spine pathology.   Electronically Signed   By: Anner Crete M.D.   On: 05/05/2015 03:58   Ct Pelvis Wo Contrast  05/05/2015   CLINICAL DATA:  Left hip pain after fall. Concern for left hip fracture.  EXAM: CT PELVIS WITHOUT CONTRAST  TECHNIQUE: Multidetector CT imaging of the pelvis was performed following the standard protocol without intravenous contrast.  COMPARISON:  Radiographs earlier this day.  FINDINGS: There is a minimally comminuted and minimally displaced fracture of the intertrochanteric  left femur primarily involving the greater trochanter. No extension of the femoral neck. The femoral head is intact. Severe osteoporosis. Right hip arthroplasty in place. Remote fracture of the right and left inferior pubic rami. Bony pelvis is otherwise intact. Urinary bladder is significantly distended.  IMPRESSION: Minimally comminuted and displaced intertrochanteric left hip fracture. The femoral head is intact.   Electronically Signed   By: Jeb Levering M.D.   On: 05/05/2015 06:25   Dg Chest Port 1 View  05/19/2015   CLINICAL DATA:  Healthcare associated pneumonia  EXAM: PORTABLE CHEST 1 VIEW  COMPARISON:  Yesterday  FINDINGS: Bibasilar airspace disease with bronchograms. Superimposed small to moderate pleural effusions. Stable heart size and mediastinal contours when allowing for rotation.  IMPRESSION: Bibasilar pneumonia with small to moderate pleural effusion, similar to yesterday. Pattern and extensive airway filling on yesterday CT raises concern for underlying aspiration.   Electronically Signed   By: Monte Fantasia M.D.   On: 05/19/2015 06:13   Dg Chest Port 1 View  04/28/2015   CLINICAL DATA:  Hypoxia/respiratory failure  EXAM: PORTABLE CHEST 1 VIEW  COMPARISON:  Chest CT May 18, 2015; chest radiograph July 10, 2011  FINDINGS: There are bilateral pleural effusions with relatively mild interstitial edema. Heart is mildly enlarged. There is mild pulmonary venous hypertension. There is patchy bibasilar atelectasis.  Bones are osteoporotic. There is increase in kyphosis. No adenopathy apparent.  IMPRESSION: Findings consistent with congestive heart failure. There is no apparent change compared to CT obtained earlier in the day.   Electronically Signed   By: Lowella Grip III M.D.   On: 04/29/2015 19:52   Dg Hip Operative Unilat With Pelvis Left  05/09/2015   CLINICAL DATA:  Left hip fracture.  EXAM: OPERATIVE left HIP (WITH PELVIS IF PERFORMED) 2 VIEWS  TECHNIQUE: Fluoroscopic  spot image(s) were submitted for interpretation post-operatively.  COMPARISON:  Radiographs and CT scan dated 05/05/2015  FINDINGS: The patient has undergone open reduction and internal fixation of  the proximal left femur fracture. Two screws and and intramedullary nail have been inserted and appear in excellent position. Alignment and position of the fracture fragments is anatomic.  IMPRESSION: Open reduction and internal fixation of left femur fracture.   Electronically Signed   By: Lorriane Shire M.D.   On: 05/09/2015 17:36   Dg Hip Unilat With Pelvis 2-3 Views Left  05/05/2015   CLINICAL DATA:  81 year old female with fall and left hip pain.  EXAM: DG HIP (WITH OR WITHOUT PELVIS) 2-3V LEFT  COMPARISON:  None.  FINDINGS: There is severe osteopenia which limits evaluation of the study for fracture. There is a right total hip arthroplasty. There is a focal area of cortical discontinue the in the medial portion of the left femoral head, likely artifactual. A cortical fracture is less likely. Clinical correlation is recommended. No definite fracture identified. There is no dislocation on the provided images. CT may provide better evaluation for fracture if clinically indicated.  The soft tissues are grossly unremarkable.  IMPRESSION: Severe osteopenia.  No definite acute fracture or dislocation.  Apparent cortical discontinuity along the medial aspect of the left femoral head is most likely artifactual and less likely represents a cortical fracture. Clinical correlation is recommended.   Electronically Signed   By: Anner Crete M.D.   On: 05/05/2015 03:51     CBC  Recent Labs Lab 05/09/2015 0959 05/19/15 0105  WBC 14.2* 13.6*  HGB 15.3* 13.3  HCT 45.5 39.1  PLT 322 274  MCV 93.8 92.4  MCH 31.5 31.4  MCHC 33.6 34.0  RDW 15.7* 15.7*  LYMPHSABS 0.6* 0.7  MONOABS 0.6 0.7  EOSABS 0.0 0.0  BASOSABS 0.0 0.0    Chemistries   Recent Labs Lab 04/25/2015 0959 05/19/15 0105  NA 139 138  K 4.3  2.8*  CL 97* 94*  CO2 23 28  GLUCOSE 137* 125*  BUN 16 13  CREATININE 0.79 0.69  CALCIUM 8.9 8.0*  AST  --  33  ALT  --  16  ALKPHOS  --  108  BILITOT  --  2.1*   ------------------------------------------------------------------------------------------------------------------ estimated creatinine clearance is 33.4 mL/min (by C-G formula based on Cr of 0.69). ------------------------------------------------------------------------------------------------------------------ No results for input(s): HGBA1C in the last 72 hours. ------------------------------------------------------------------------------------------------------------------ No results for input(s): CHOL, HDL, LDLCALC, TRIG, CHOLHDL, LDLDIRECT in the last 72 hours. ------------------------------------------------------------------------------------------------------------------ No results for input(s): TSH, T4TOTAL, T3FREE, THYROIDAB in the last 72 hours.  Invalid input(s): FREET3 ------------------------------------------------------------------------------------------------------------------ No results for input(s): VITAMINB12, FOLATE, FERRITIN, TIBC, IRON, RETICCTPCT in the last 72 hours.  Coagulation profile No results for input(s): INR, PROTIME in the last 168 hours.  No results for input(s): DDIMER in the last 72 hours.  Cardiac Enzymes  Recent Labs Lab 05/02/2015 1316 05/14/2015 1905 05/19/15 0105  TROPONINI 1.43* 1.23* 0.96*   ------------------------------------------------------------------------------------------------------------------ Invalid input(s): POCBNP     Time Spent in minutes   20 minutes   Tonette Koehne M.D on 05/22/2015 at 11:43 AM  Between 7am to 7pm - Pager - [phone]  After 7pm go to [email or web address] - password [facility]  [facility]   Office  [phone]

## 2015-05-22 NOTE — Progress Notes (Signed)
Daily Progress Note   Patient Name: Laura Benitez       Date: 05/22/2015 DOB: [birth year]  Age: 81 y.o. MRN#: [id number] Attending Physician: Albertine Patricia, MD Primary Care Physician: [name], MD Admit Date: 05/05/2015  Reason for Consultation/Follow-up: GOC  Subjective:     No family at bedside. Ms. Rockhold is unresponsive, unlabored breathing, toes are now very cold to the touch, more pale today. Congestion is better than yesterday. Continue with plan for comfort care.    Length of Stay: 4 days  Current Medications: Scheduled Meds:  . levETIRAcetam  500 mg Intravenous Q12H  .  morphine injection  1 mg Intravenous 4 times per day  . scopolamine  1 patch Transdermal Q72H    Continuous Infusions:    PRN Meds: acetaminophen, [DISCONTINUED] glycopyrrolate **OR** glycopyrrolate **OR** [DISCONTINUED] glycopyrrolate, LORazepam, morphine injection  Palliative Performance Scale: 10%     Vital Signs: BP 124/73 mmHg  Pulse 106  Temp(Src) 97.7 F (36.5 C) (Axillary)  Resp 20  Ht 5\' 5"  (1.651 m)  Wt 40.4 kg (89 lb 1.1 oz)  BMI 14.82 kg/m2  SpO2 85% SpO2: SpO2: (!) 85 % O2 Device: O2 Device: Nasal Cannula O2 Flow Rate: O2 Flow Rate (L/min): 2 L/min  Intake/output summary:  Intake/Output Summary (Last 24 hours) at 05/22/15 1236 Last data filed at 05/22/15 0912  Gross per 24 hour  Intake    210 ml  Output      0 ml  Net    210 ml   Baseline Weight: Weight: 40.4 kg (89 lb 1.1 oz) Most recent weight: Weight: 40.4 kg (89 lb 1.1 oz)  Physical Exam: General: NAD, lying in bed HEENT: Severe temporal muscle wasting CVS: Tachycardic Resp: Tachypneic, congested Extremities: Feet cold to touch Neuro: Unresponsive     Additional Data Reviewed: No results for input(s): WBC, HGB, PLT, NA, BUN, CREATININE, ALB in the last 72 hours.   Problem List:  Patient Active Problem List   Diagnosis Date Noted  . Protein-calorie malnutrition, severe ([place]) 05/21/2015  .  Palliative care by specialist   . Palliative care encounter   . Elevated troponin I level 05/10/2015  . Acute respiratory failure with hypoxia ([place]) 05/07/2015  . Bilateral pneumonia 05/04/2015  . HCAP (healthcare-associated pneumonia) 05/01/2015  . Hypoxic encephalopathy ([place]) 05/08/2015  . Sepsis due to pneumonia ([place]) 04/24/2015  . Acute bronchitis 05/16/2015  . Osteoporosis 05/14/2015  . Dysphagia, pharyngoesophageal phase 05/14/2015  . Decubitus ulcer of sacral region, stage 3 ([place]) 05/14/2015  . Acute blood loss anemia   . Pulmonary hypertension ([place])   . Tricuspid valve regurgitation   . CAD in native artery   . Closed left hip fracture ([place])   . Hyponatremia 05/05/2015  . Fall at nursing home 05/05/2015  . Thrombocytopenia ([place]) 05/05/2015  . Hypotension 05/05/2015  . Fall   . Alzheimer's disease 05/03/2015  . CAD (coronary artery disease) 01/11/2015  . PAD (peripheral artery disease) ([place]) 01/11/2015  . Malaise 01/26/2014  . H/O carotid atherosclerosis 10/29/2013  . Hypothyroidism 01/06/2013  . 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 GERD (gastroesophageal reflux disease) 01/06/2013  . DNR (do not resuscitate) 01/06/2013  . Venous insufficiency 12/17/2011  . Constipation 09/11/2011  . Depression 07/10/2011  . Anxiety 07/10/2011     Palliative Care Assessment & Plan    Code Status:  DNR  Goals of Care:  Full comfort care.  3. Symptom Management:  Dyspnea: Morphine 1 mgevery 6 hours and 1-2  mg every 2 hours prn.   Anxiety/seizure: Ativan 1-2 mg every 2 hours prn.   Seizures: Continue Keppra 500 mg BID and ativan prn.   Fever: Acetaminophen every 4 hours prn.   4. Prognosis: Very poor - likely hours.    5. Discharge Planning: Anticipate hospital death.   Thank you for allowing the Palliative Medicine Team to assist in the care of this patient.   Time In: 1000 Time Out: 1015 Total Time 25min Prolonged Time Billed  no    Greater than 50%   of this time was spent counseling and coordinating care related to the above assessment and plan.   Vinie Sill, NP Palliative Medicine Team Pager # [phone] (M-F 8a-5p) Team Phone # [phone] (Nights/Weekends)  05/22/2015, 12:36 PM

## 2015-05-23 DIAGNOSIS — G931 Anoxic brain damage, not elsewhere classified: Secondary | ICD-10-CM

## 2015-05-23 LAB — CULTURE, BLOOD (ROUTINE X 2)
CULTURE: NO GROWTH
Culture: NO GROWTH

## 2015-05-23 MED ORDER — MORPHINE SULFATE (PF) 2 MG/ML IV SOLN
1.0000 mg | INTRAVENOUS | Status: DC | PRN
  Administered 2015-05-23: 2 mg via INTRAVENOUS
  Filled 2015-05-23: qty 1

## 2015-06-19 NOTE — Discharge Summary (Addendum)
Death Summary  Laura Benitez QQI:[id number] DOB: [birth year] DOA: 2015-06-03  PCP: [name], MD   Admit date: 06-03-2015 Date of Death: 06-08-15  Final Diagnoses:  Principal Problem:   Sepsis due to pneumonia [facility]) Active Problems:   Hypothyroidism   DNR (do not resuscitate)   CAD (coronary artery disease)   PAD (peripheral artery disease) ([employer])   Alzheimer's disease   Elevated troponin I level   Acute respiratory failure with hypoxia (HCC)   Bilateral pneumonia   HCAP (healthcare-associated pneumonia)   Hypoxic encephalopathy ([place])   Palliative care encounter   Palliative care by specialist   Protein-calorie malnutrition, severe ([place])    81 year old female with recent history of left hip fracture and underwent open reduction and internal fixation of the left femur on 05/09/2015 by Dr. Erlinda Hong. During previous admission, Presented to the ER with significant hypoxia initial oxygen saturation 60%, CT scan significant for bilateral opacities, started on broad-spectrum antibiotics for HCAP, as well found to have elevated troponin, echo findings significant for drop in EF and had pleural effusions- given lasix for acute systolic CHF ex, and LAD territory wall motion abnormality, finding consistent with non-STEMI.  Made comfort care  Time: 25  Signed:  Eulogio Bear  [facility] 06/08/15, 4:55 PM

## 2015-06-19 NOTE — Progress Notes (Signed)
PROGRESS NOTE  LOWANDA CASHAW ZOX:[id number] DOB: 03/24/1929 DOA: 04/23/2015 PCP: [name], MD  81 year old female with recent history of left hip fracture and underwent open reduction and internal fixation of the left femur on 05/09/2015 by Dr. Erlinda Hong. During previous admission, Presented to the ER with significant hypoxia initial oxygen saturation 60%, CT scan significant for bilateral opacities, started on broad-spectrum antibiotics for HCAP, as well found to have elevated troponin, echo findings significant for drop in EF, and LAD territory wall motion abnormality, finding consistent with non-STEMI   Assessment/Plan: Comfort focus with in hospital death anticipated    Code Status: DNR Family Communication: no family at bedside Disposition Plan:    Consultants:  Palliative care  Ortho  cards  Procedures:     HPI/Subjective: Appears to need more morphine  Objective: Filed Vitals:   2015-06-05 0640  BP:   Pulse:   Temp: 97.9 F (36.6 C)  Resp: 18    Intake/Output Summary (Last 24 hours) at 06-05-2015 1209 Last data filed at 2015-06-05 0600  Gross per 24 hour  Intake      0 ml  Output      0 ml  Net      0 ml   Filed Weights   04/27/2015 1749  Weight: 40.4 kg (89 lb 1.1 oz)    Exam:   General:  Eyes open  Cardiovascular: rrr  Respiratory: coarse   Data Reviewed: Basic Metabolic Panel:  Recent Labs Lab 04/23/2015 0959 05/19/15 0105  NA 139 138  K 4.3 2.8*  CL 97* 94*  CO2 23 28  GLUCOSE 137* 125*  BUN 16 13  CREATININE 0.79 0.69  CALCIUM 8.9 8.0*   Liver Function Tests:  Recent Labs Lab 05/19/15 0105  AST 33  ALT 16  ALKPHOS 108  BILITOT 2.1*  PROT 5.5*  ALBUMIN 2.8*   No results for input(s): LIPASE, AMYLASE in the last 168 hours. No results for input(s): AMMONIA in the last 168 hours. CBC:  Recent Labs Lab 05/09/2015 0959 05/19/15 0105  WBC 14.2* 13.6*  NEUTROABS 13.0* 12.2*  HGB 15.3* 13.3  HCT 45.5 39.1  MCV 93.8 92.4    PLT 322 274   Cardiac Enzymes:  Recent Labs Lab 05/10/2015 1316 05/03/2015 1905 05/19/15 0105  TROPONINI 1.43* 1.23* 0.96*   BNP (last 3 results)  Recent Labs  04/26/2015 0959  BNP 285.7*    ProBNP (last 3 results) No results for input(s): PROBNP in the last 8760 hours.  CBG:  Recent Labs Lab 05/10/2015 1835  GLUCAP 105*    Recent Results (from the past 240 hour(s))  Blood culture (routine x 2)     Status: None   Collection Time: 04/25/2015  9:59 AM  Result Value Ref Range Status   Specimen Description BLOOD RIGHT ARM  Final   Special Requests BOTTLES DRAWN AEROBIC ONLY 2CC  Final   Culture NO GROWTH 5 DAYS  Final   Report Status 06-05-2015 FINAL  Final  Blood culture (routine x 2)     Status: None   Collection Time: 05/16/2015 11:13 AM  Result Value Ref Range Status   Specimen Description BLOOD RIGHT ANTECUBITAL  Final   Special Requests BOTTLES DRAWN AEROBIC AND ANAEROBIC 5CC  Final   Culture NO GROWTH 5 DAYS  Final   Report Status June 05, 2015 FINAL  Final  Culture, respiratory (NON-Expectorated)     Status: None   Collection Time: 05/11/2015  6:07 PM  Result Value Ref Range Status  Specimen Description TRACHEAL ASPIRATE  Final   Special Requests Normal  Final   Gram Stain   Final    NO WBC SEEN MODERATE SQUAMOUS EPITHELIAL CELLS PRESENT FEW YEAST Performed at [facility]    Culture   Final    FEW CANDIDA ALBICANS Performed at [facility]    Report Status 05/21/2015 FINAL  Final     Studies: No results found.  Scheduled Meds: . levETIRAcetam  500 mg Intravenous Q12H  .  morphine injection  1 mg Intravenous 4 times per day  . scopolamine  1 patch Transdermal Q72H   Continuous Infusions:  Antibiotics Given (last 72 hours)    None      Principal Problem:   Sepsis due to pneumonia ([place]) Active Problems:   Hypothyroidism   DNR (do not resuscitate)   CAD (coronary artery disease)   PAD (peripheral artery disease) ([place])    Alzheimer's disease   Elevated troponin I level   Acute respiratory failure with hypoxia ([place])   Bilateral pneumonia   HCAP (healthcare-associated pneumonia)   Hypoxic encephalopathy ([place])   Palliative care encounter   Palliative care by specialist   Protein-calorie malnutrition, severe ([place])    Time spent: [place], [facility] Pager [phone]. If 7PM-7AM, please contact night-coverage at [email or web address], password [facility] June 11, 2015, 12:09 PM  LOS: 5 days

## 2015-06-19 NOTE — Progress Notes (Signed)
Nutrition Brief Note  Chart reviewed. Pt now transitioning to comfort care.  No further nutrition interventions warranted at this time.  Please re-consult as needed.   Salah Burlison A. Bobbiejo Ishikawa, RD, LDN, CDE Pager: [phone] After hours Pager: [phone]  

## 2015-06-19 NOTE — Clinical Documentation Improvement (Signed)
Internal Medicine  Can the diagnosis of CHF be further specified?    Acuity - Acute, Chronic, Acute on Chronic   Type - Systolic, Diastolic, Systolic and Diastolic  Other  Clinically Undetermine  Supporting Information: 05/07/2015: LVEF= 45%  Please exercise your independent, professional judgment when responding. A specific answer is not anticipated or expected.   Thank You,  [place] [phone]

## 2015-06-19 NOTE — Progress Notes (Signed)
Patients friend Romie Minus notified of patients death. Friend stated she would get in touch with patient next of kin and notify nurse when plans are discussed amongst family as to where patient would go. Woodward Ku stated patient wishes are to be cremated.

## 2015-06-19 NOTE — Progress Notes (Signed)
Patient expired with time of death 55. Verified with second nurse Lovena Le, RN. MD notified.

## 2015-06-19 NOTE — Progress Notes (Signed)
[name] appears to be progressing at EOL. Requesting RN to administer morphine for comfort with labored breathing. Oxygen removed as this is not going to provide comfort at this stage but will only serve to prolong her state of suffering at EOL. I have discussed with RN and Dr. Eliseo Squires.   Laura Sill, NP Palliative Medicine Team Pager # [phone] (M-F 8a-5p) Team Phone # [phone] (Nights/Weekends)

## 2015-06-19 DEATH — deceased

## 2015-06-27 ENCOUNTER — Ambulatory Visit: Payer: Medicare Other | Admitting: Cardiovascular Disease

## 2015-06-28 ENCOUNTER — Encounter: Payer: Self-pay | Admitting: Internal Medicine

## 2017-04-24 IMAGING — CT CT PELVIS W/O CM
3 of 4 series · 17 of 46 positions shown, 19 images · non-contrast
Comparison: Radiographs earlier this day.

CLINICAL DATA: Left hip pain after fall. Concern for left hip
fracture.

EXAM:
CT PELVIS WITHOUT CONTRAST
TECHNIQUE: Multidetector CT imaging of the pelvis was performed following the
standard protocol without intravenous contrast.

[Series 2: bone windows · axial · 0.72mm/px · z∈[-256,-166]mm · 4 of 98 slices shown]
[im 11/98  bone]
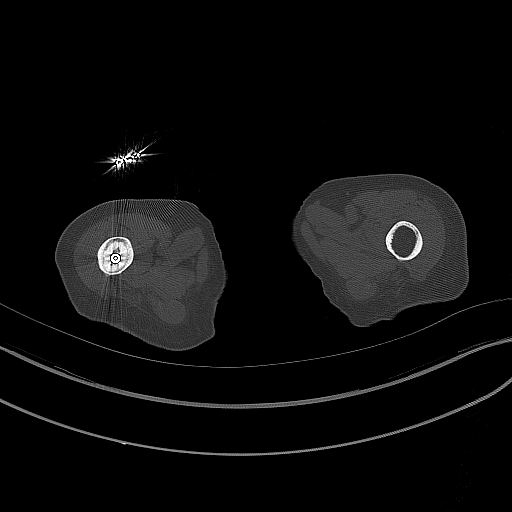
[im 21/98  bone]
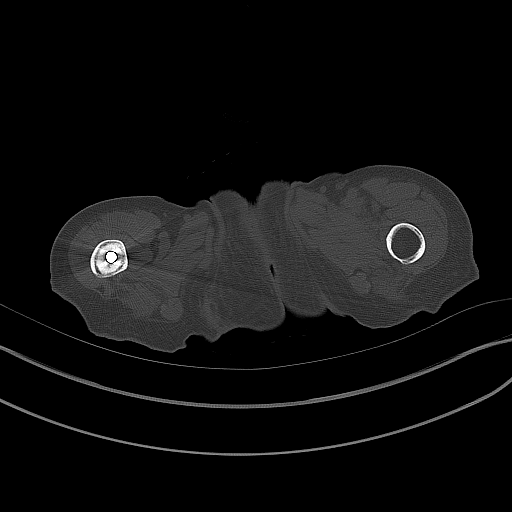
[im 31/98  bone]
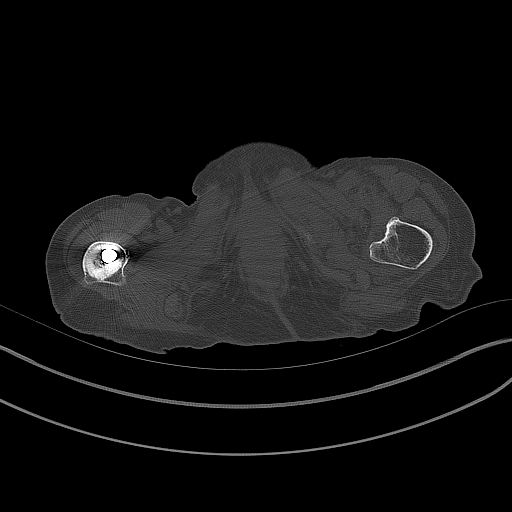
[im 41/98  bone]
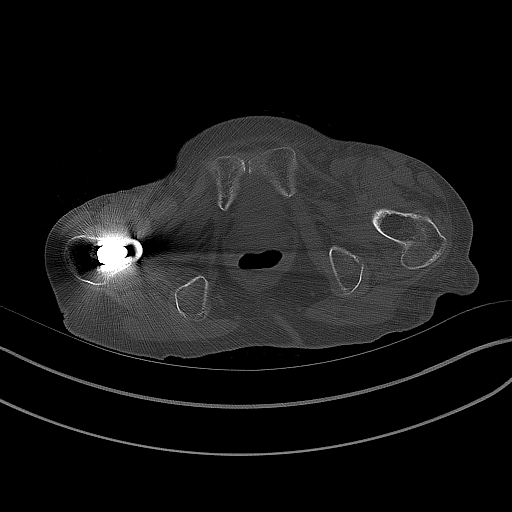

[Series 3: pelvis st · axial · 0.72mm/px · z∈[-260,-25]mm · 10 of 59 slices shown, 12 images]
[im 6/59  soft-tissue]
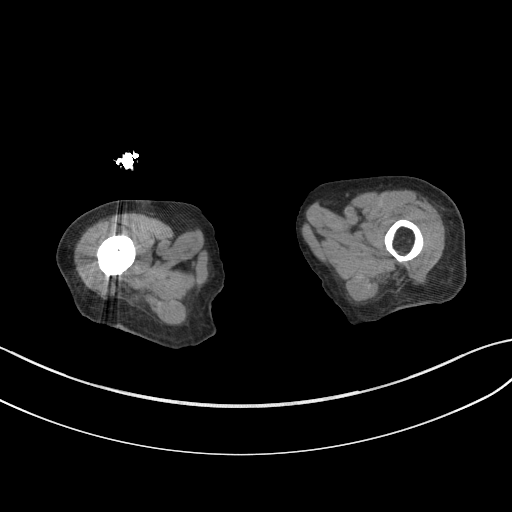
[im 6/59  bone]
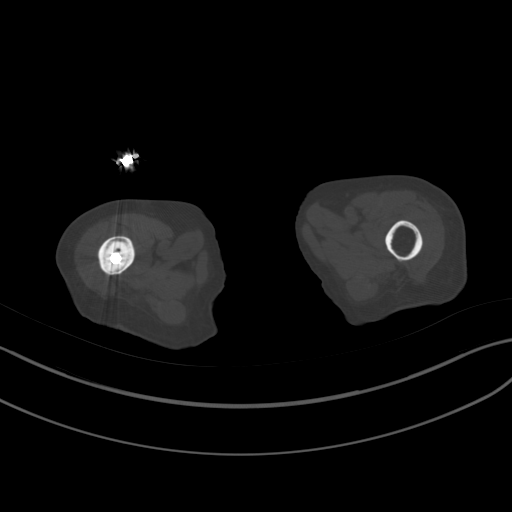
[im 11/59  soft-tissue]
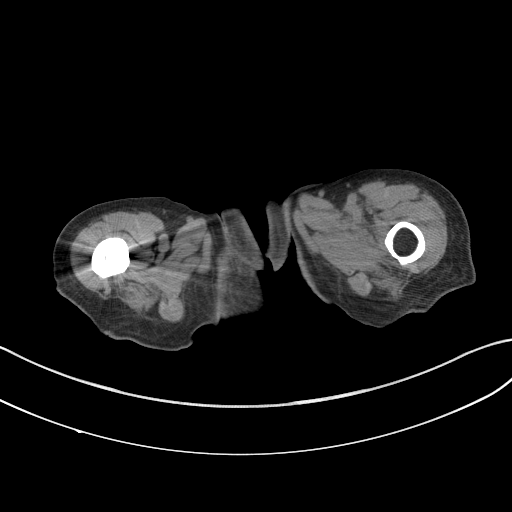
[im 16/59  soft-tissue]
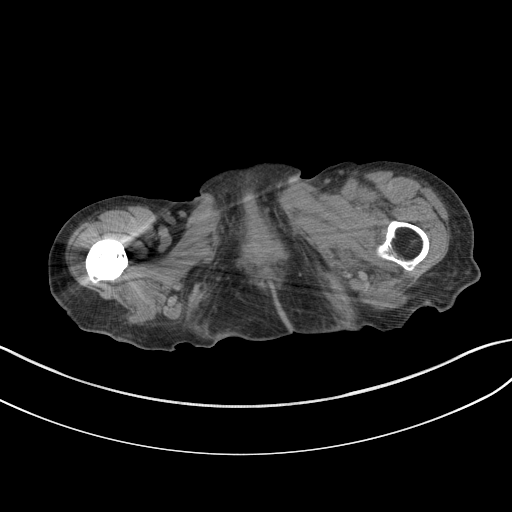
[im 22/59  soft-tissue]
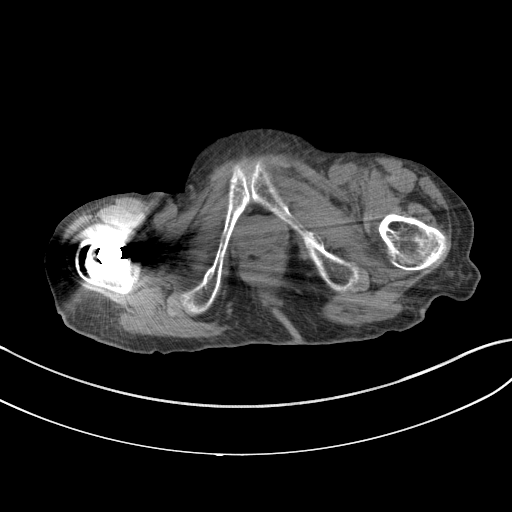
[im 27/59  soft-tissue]
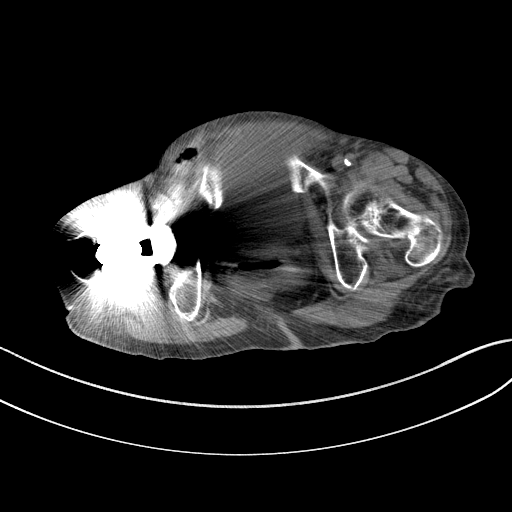
[im 32/59  soft-tissue]
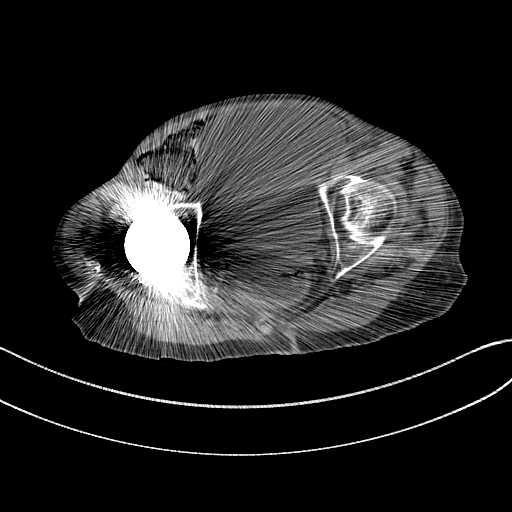
[im 37/59  soft-tissue]
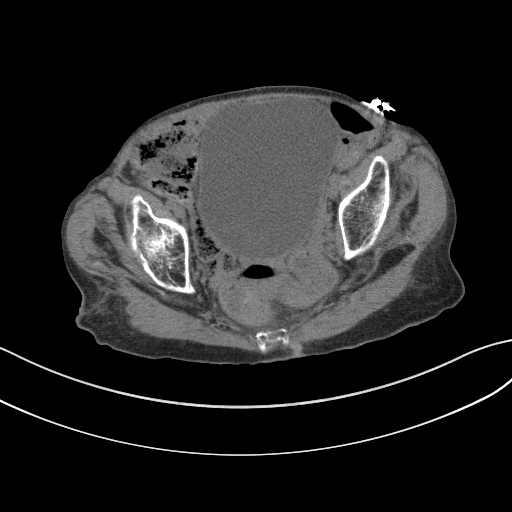
[im 43/59  soft-tissue]
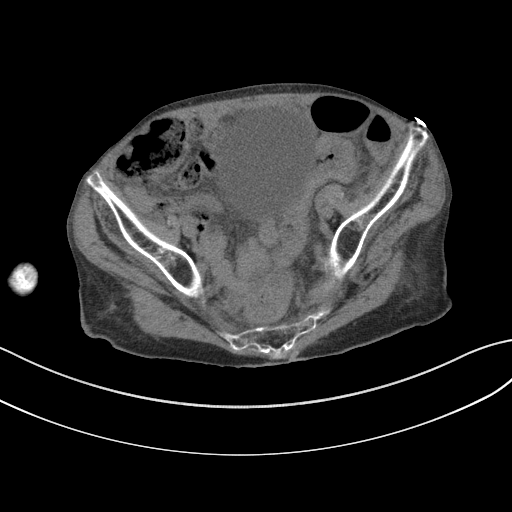
[im 48/59  soft-tissue]
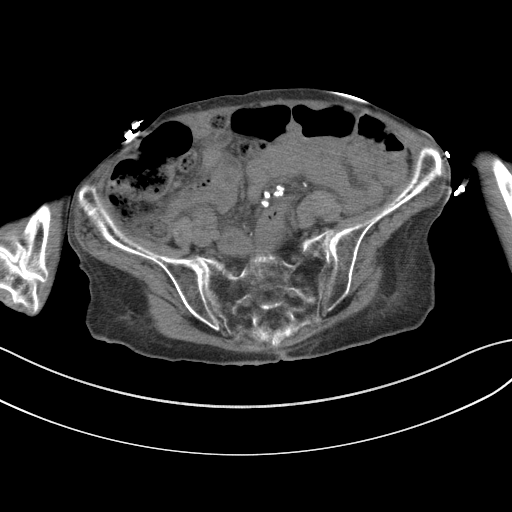
[im 48/59  bone]
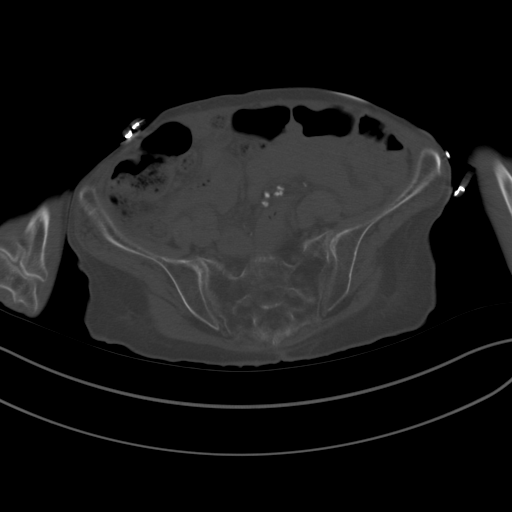
[im 53/59  soft-tissue]
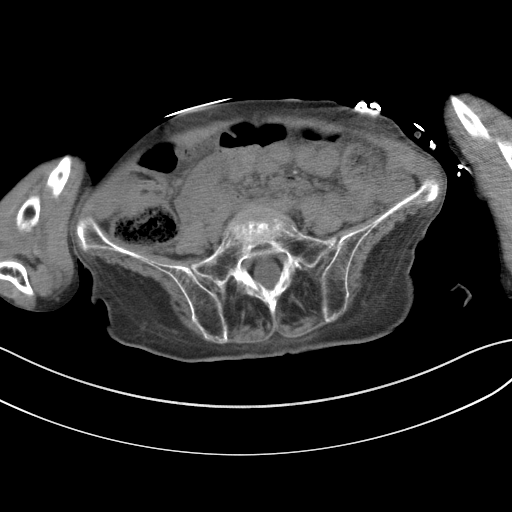

[Series 5: coronal images · coronal · 0.56mm/px · 3 of 98 slices shown]
[im 33/98  soft-tissue]
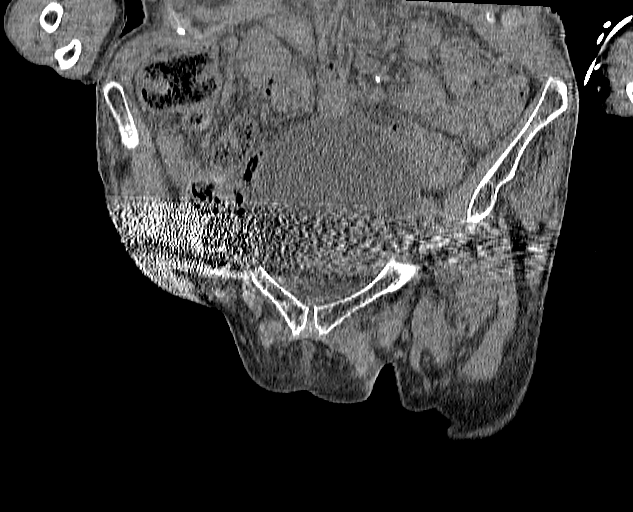
[im 44/98  soft-tissue]
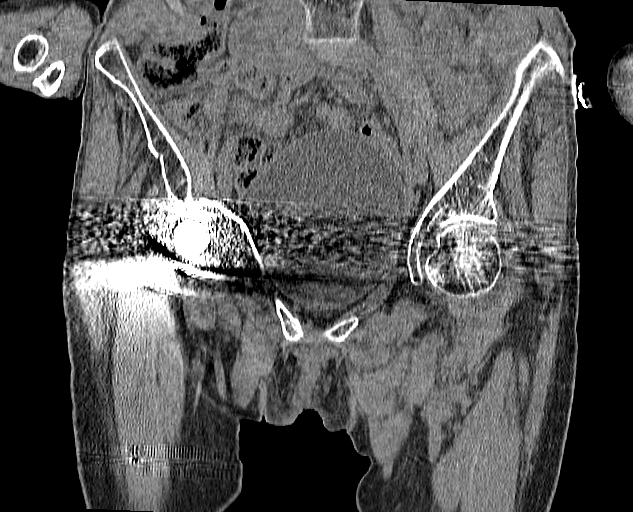
[im 54/98  soft-tissue]
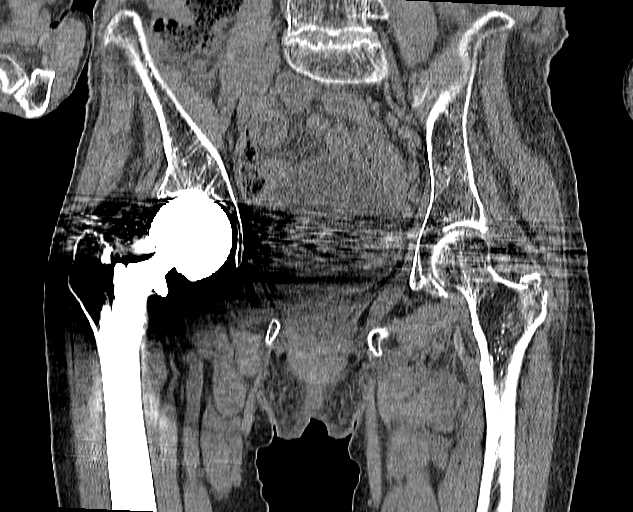

[17 of 46 positions shown; findings below may reference images not displayed]

FINDINGS: There is a minimally comminuted and minimally displaced fracture of
the intertrochanteric left femur primarily involving the greater
trochanter. No extension of the femoral neck. The femoral head is
intact. Severe osteoporosis. Right hip arthroplasty in place. Remote
fracture of the right and left inferior pubic rami. Bony pelvis is
otherwise intact. Urinary bladder is significantly distended.
IMPRESSION: Minimally comminuted and displaced intertrochanteric left hip
fracture. The femoral head is intact.

## 2017-04-24 IMAGING — CT CT CERVICAL SPINE W/O CM
4 of 5 series · 14 of 33 positions shown, 16 images · non-contrast
Comparison: None.

CLINICAL DATA: 86-year-old female with fall

EXAM:
CT HEAD WITHOUT CONTRAST
CT CERVICAL SPINE WITHOUT CONTRAST
TECHNIQUE: Multidetector CT imaging of the head and cervical spine was
performed following the standard protocol without intravenous
contrast. Multiplanar CT image reconstructions of the cervical spine
were also generated.

[Series 4: bone windows · axial · 0.43mm/px · z∈[-174,-132]mm · 2 of 57 slices shown]
[im 15/57  bone]
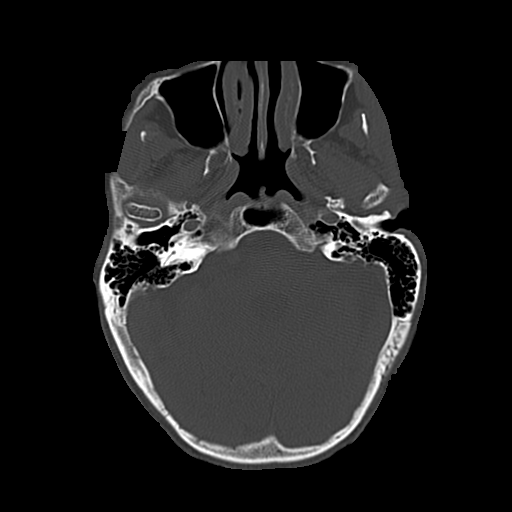
[im 29/57  bone]
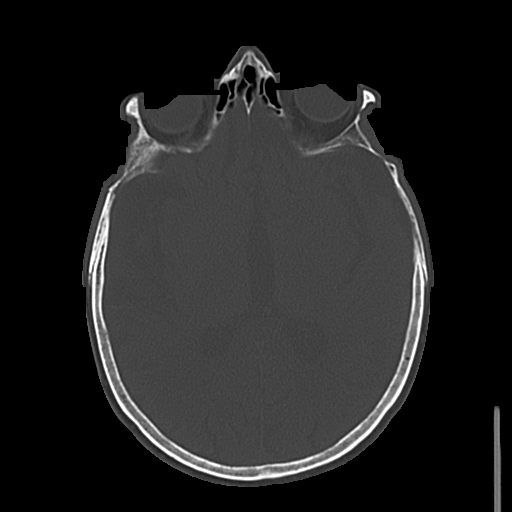

[Series 5: c-spine st · axial · 0.21mm/px · z∈[-295,-209]mm · 4 of 73 slices shown, 5 images]
[im 15/73  soft-tissue]
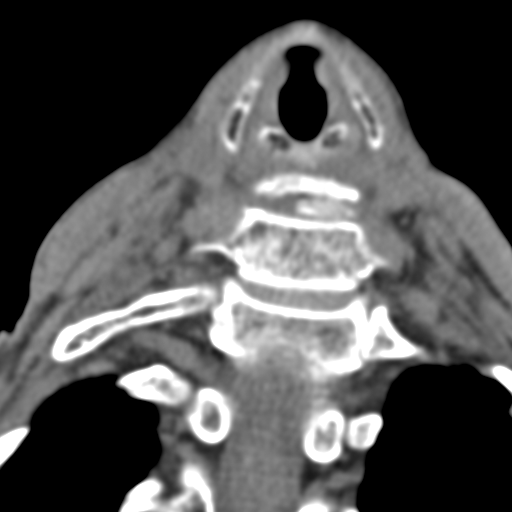
[im 15/73  bone]
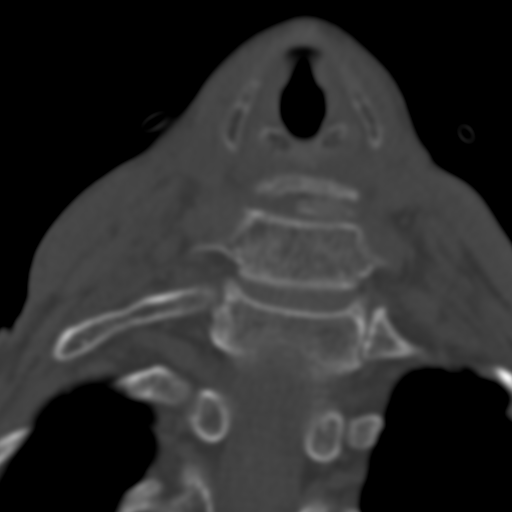
[im 29/73  bone]
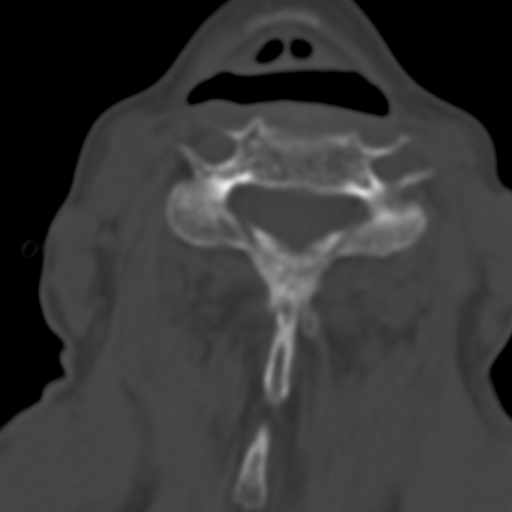
[im 44/73  bone]
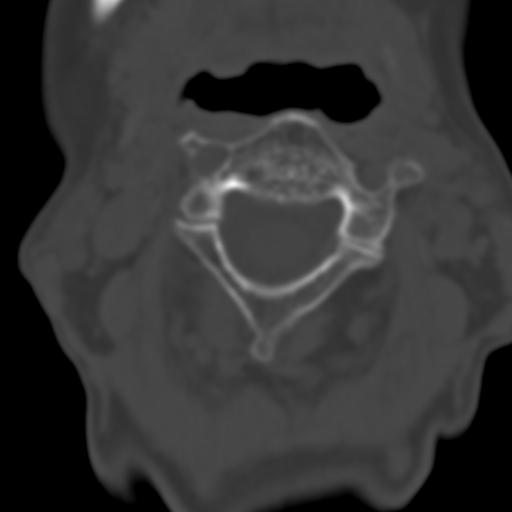
[im 58/73  bone]
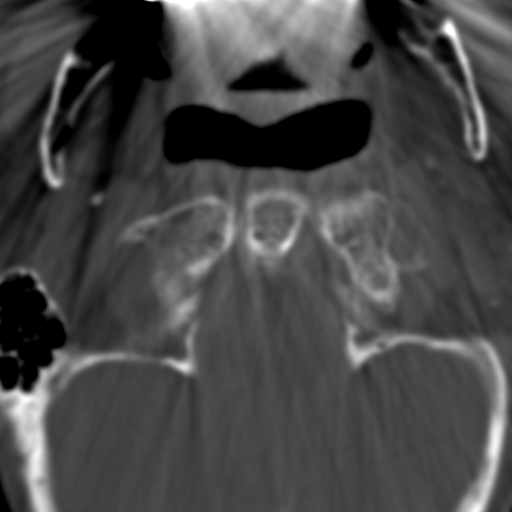

[Series 11: coronal · coronal · 0.23mm/px · 3 of 47 slices shown]
[im 10/47  bone]
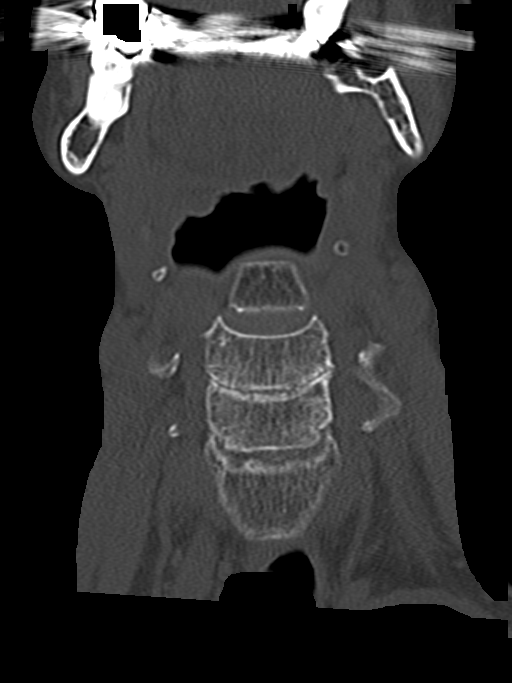
[im 19/47  bone]
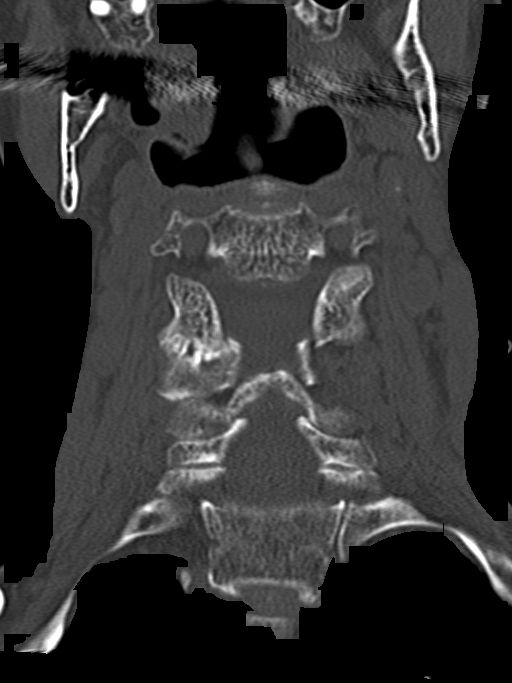
[im 28/47  bone]
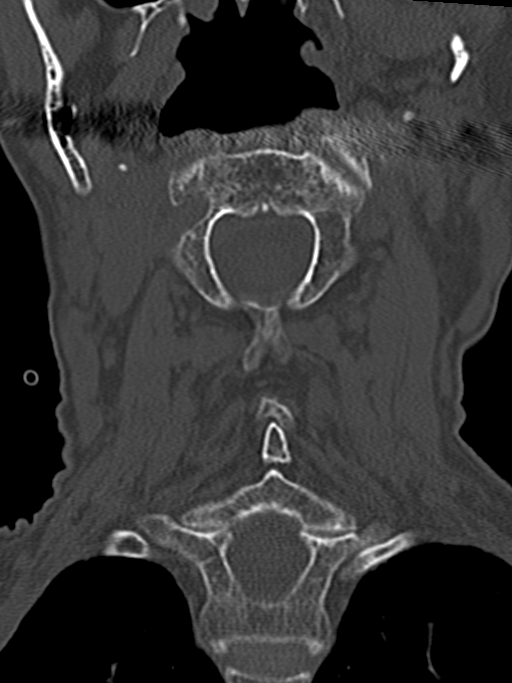

[Series 12: sagittal · sagittal · 0.21mm/px · 5 of 32 slices shown, 6 images]
[im 11/32  bone]
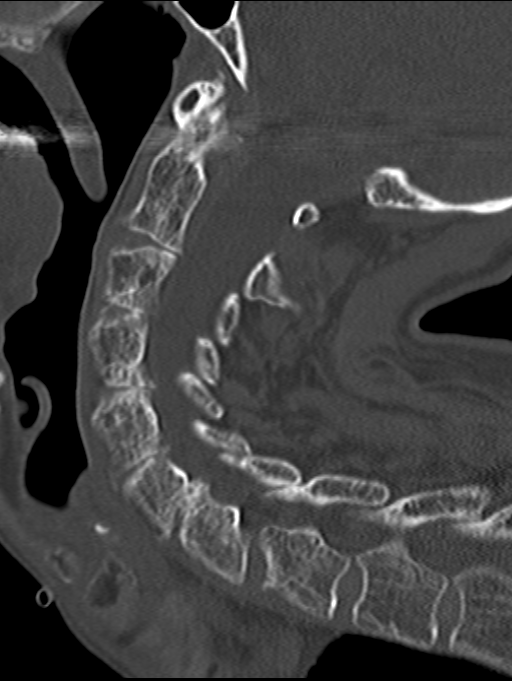
[im 13/32  bone]
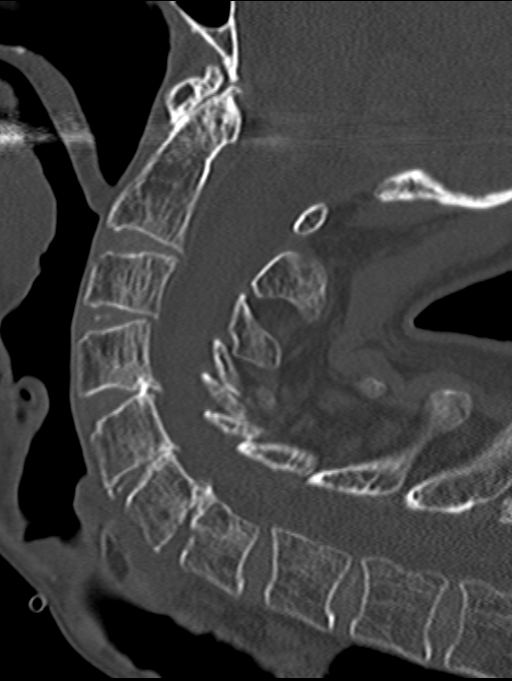
[im 16/32  soft-tissue]
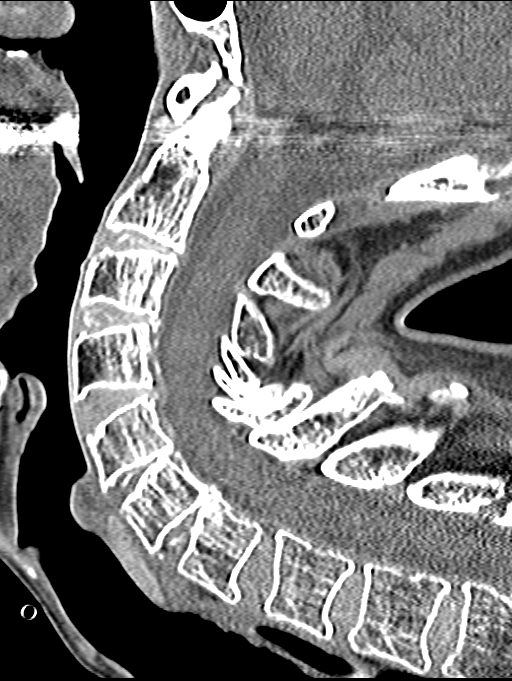
[im 16/32  bone]
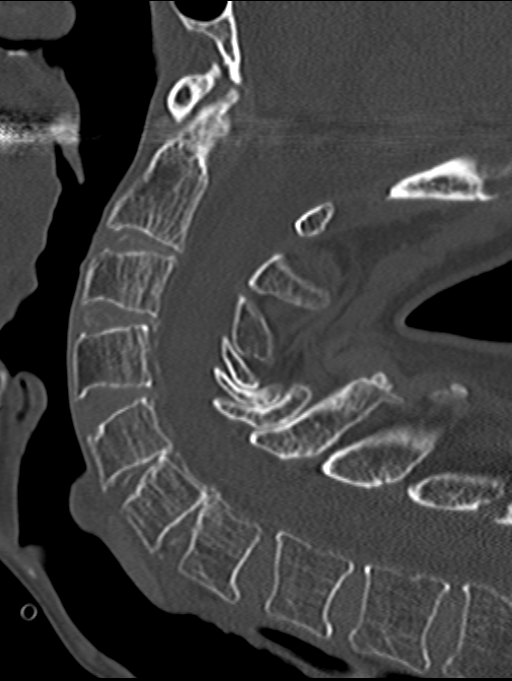
[im 19/32  bone]
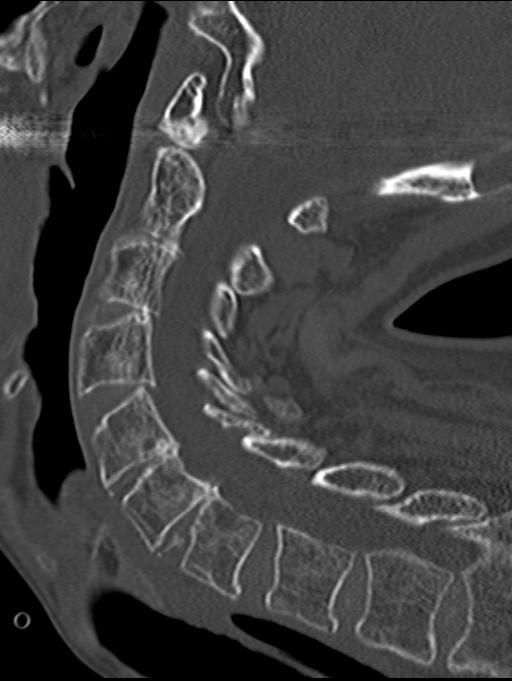
[im 21/32  bone]
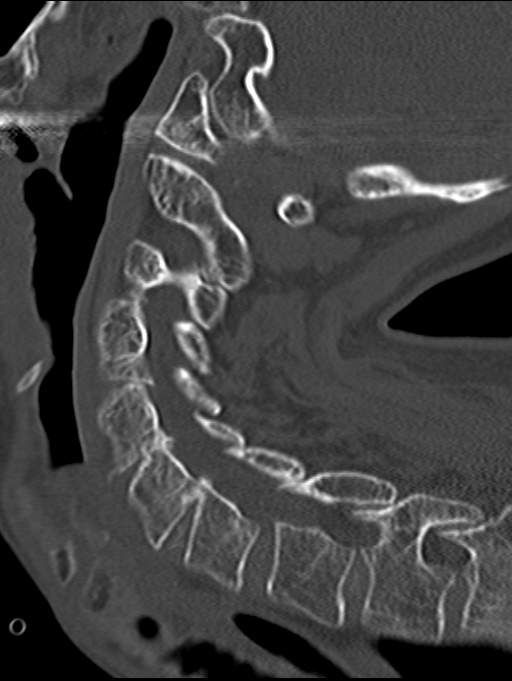

[14 of 33 positions shown; findings below may reference images not displayed]

FINDINGS: CT HEAD FINDINGS

There is mild dilatation of the ventricles and sulci compatible with
age-related atrophy. Periventricular and deep white matter
hypodensities represent chronic microvascular ischemic changes. Left
frontal old infarct and encephalomalacia. There is a small focal
area of encephalomalacia changes in the anterior left temporal lobe,
likely related to an old insult. There is no intracranial
hemorrhage. No mass effect or midline shift identified.

The visualized paranasal sinuses and mastoid air cells are well
aerated. A small focal lucency noted in the occiput. The calvarium
is intact.

CT CERVICAL SPINE FINDINGS

There is no acute fracture or subluxation of the cervical
spine.There is advanced osteopenia. Multilevel degenerative
changes.The odontoid and spinous processes are intact.There is
normal anatomic alignment of the C1-C2 lateral masses. The
visualized soft tissues appear unremarkable.
IMPRESSION: No acute intracranial pathology.

Age-related atrophy and chronic microvascular ischemic disease. Old
left frontal lobe infarct and encephalomalacia.

Advanced osteopenia.  No acute/traumatic cervical spine pathology.
